# Patient Record
Sex: Male | Born: 1975 | Hispanic: Yes | Marital: Single | Smoking: Former smoker
Health system: Southern US, Community
[De-identification: ages and names within clinical notes are randomized; demographics above are authoritative.]

## PROBLEM LIST (undated history)

## (undated) DIAGNOSIS — J189 Pneumonia, unspecified organism: Secondary | ICD-10-CM

## (undated) DIAGNOSIS — I639 Cerebral infarction, unspecified: Secondary | ICD-10-CM

## (undated) DIAGNOSIS — F419 Anxiety disorder, unspecified: Secondary | ICD-10-CM

## (undated) DIAGNOSIS — J45909 Unspecified asthma, uncomplicated: Secondary | ICD-10-CM

## (undated) DIAGNOSIS — F32A Depression, unspecified: Secondary | ICD-10-CM

---

## 2012-10-12 HISTORY — PX: LEG SURGERY: SHX1003

## 2016-05-03 ENCOUNTER — Emergency Department (HOSPITAL_BASED_OUTPATIENT_CLINIC_OR_DEPARTMENT_OTHER): Payer: No Typology Code available for payment source

## 2016-05-03 ENCOUNTER — Emergency Department (HOSPITAL_BASED_OUTPATIENT_CLINIC_OR_DEPARTMENT_OTHER)
Admission: EM | Admit: 2016-05-03 | Discharge: 2016-05-03 | Disposition: A | Discharge status: Home / Self Care | Payer: No Typology Code available for payment source | Attending: Emergency Medicine | Admitting: Emergency Medicine

## 2016-05-03 ENCOUNTER — Encounter (HOSPITAL_BASED_OUTPATIENT_CLINIC_OR_DEPARTMENT_OTHER): Payer: Self-pay | Admitting: Emergency Medicine

## 2016-05-03 DIAGNOSIS — Y92410 Unspecified street and highway as the place of occurrence of the external cause: Secondary | ICD-10-CM | POA: Diagnosis not present

## 2016-05-03 DIAGNOSIS — F17200 Nicotine dependence, unspecified, uncomplicated: Secondary | ICD-10-CM | POA: Diagnosis not present

## 2016-05-03 DIAGNOSIS — M25512 Pain in left shoulder: Secondary | ICD-10-CM | POA: Diagnosis present

## 2016-05-03 DIAGNOSIS — Y9389 Activity, other specified: Secondary | ICD-10-CM | POA: Insufficient documentation

## 2016-05-03 DIAGNOSIS — Y999 Unspecified external cause status: Secondary | ICD-10-CM | POA: Diagnosis not present

## 2016-05-03 DIAGNOSIS — S43102A Unspecified dislocation of left acromioclavicular joint, initial encounter: Secondary | ICD-10-CM | POA: Insufficient documentation

## 2016-05-03 MED ORDER — KETOROLAC TROMETHAMINE 30 MG/ML IJ SOLN
30.0000 mg | Freq: Once | INTRAMUSCULAR | Status: AC
  Administered 2016-05-03: 30 mg via INTRAMUSCULAR
  Filled 2016-05-03: qty 1

## 2016-05-03 MED ORDER — IBUPROFEN 800 MG PO TABS: 800.0000 mg | ORAL_TABLET | Freq: Three times a day (TID) | ORAL | 0 refills | Status: DC
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
## 2016-05-03 NOTE — ED Notes (Signed)
Pt on phone when MD came to assess, pt refused to get off phone.
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
## 2016-09-14 ENCOUNTER — Encounter (HOSPITAL_COMMUNITY): Payer: Self-pay | Admitting: *Deleted

## 2016-09-14 ENCOUNTER — Emergency Department (HOSPITAL_COMMUNITY)
Admission: EM | Admit: 2016-09-14 | Discharge: 2016-09-14 | Disposition: A | Discharge status: Home / Self Care | Payer: Self-pay | Attending: Emergency Medicine | Admitting: Emergency Medicine

## 2016-09-14 DIAGNOSIS — F17200 Nicotine dependence, unspecified, uncomplicated: Secondary | ICD-10-CM | POA: Insufficient documentation

## 2016-09-14 DIAGNOSIS — Z8673 Personal history of transient ischemic attack (TIA), and cerebral infarction without residual deficits: Secondary | ICD-10-CM | POA: Insufficient documentation

## 2016-09-14 DIAGNOSIS — K0889 Other specified disorders of teeth and supporting structures: Secondary | ICD-10-CM

## 2016-09-14 DIAGNOSIS — K029 Dental caries, unspecified: Secondary | ICD-10-CM | POA: Insufficient documentation

## 2016-09-14 DIAGNOSIS — R220 Localized swelling, mass and lump, head: Secondary | ICD-10-CM

## 2016-09-14 HISTORY — DX: Cerebral infarction, unspecified: I63.9

## 2016-09-14 MED ORDER — CLINDAMYCIN HCL 150 MG PO CAPS: 300.0000 mg | ORAL_CAPSULE | Freq: Three times a day (TID) | ORAL | 0 refills | Status: DC

## 2016-09-14 NOTE — ED Notes (Signed)
Declined W/C at D/C and was escorted to lobby by RN. 

## 2016-09-14 NOTE — ED Provider Notes (Signed)
[facility] Provider Note   CSN: [id number] Arrival date & time: 09/14/16  [id number]  By signing my name below, I, Gregory Bishop, attest that this documentation has been prepared under the direction and in the presence of Dione HousekeeperKenith Ruthia Person, PA. Electronically Signed: Javier Dockerobert Ryan Bishop, ER Scribe. 05/23/2016. 9:06 AM.   History   Chief Complaint Chief Complaint  Patient presents with  . Dental Pain   HPI  HPI Comments: Gregory Bishop is a 40 y.o. male who presents to the Emergency Department complaining two days of upper right sided facial swelling.  He had dental pain at onset, but has no pain at this time. He denies difficulty breathing, swllowing, fever or chills. Denies any erythema or warmth of the area. Denies any purulent discharge or trismus. Denies any pain at this time. He has a past hx of tooth issues. He does not have a dentist in the area. He has been trying holistic treatments for pain and swelling at home including coconut oil and tumeric with sign relief of swelling and pain. Denies any other symptoms at this time.   Past Medical History:  Diagnosis Date  . Stroke [facility](HCC)     There are no active problems to display for this patient.   Past Surgical History:  Procedure Laterality Date  . LEG SURGERY Left 2014    OB History    No data available      Home Medications    Prior to Admission medications   Medication Sig Start Date End Date Taking? Authorizing Provider  ibuprofen (ADVIL,MOTRIN) 800 MG tablet Take 1 tablet (800 mg total) by mouth 3 (three) times daily. 05/03/16   Marily MemosJason Mesner, MD    Family History History reviewed. No pertinent family history.  Social History Social History  Substance Use Topics  . Smoking status: Current Every Day Smoker  . Smokeless tobacco: Never Used  . Alcohol use Yes     Comment: daily     Allergies   Patient has no known allergies.   Review of Systems Review of Systems  Constitutional: Negative for chills  and fever.  HENT: Positive for facial swelling. Negative for mouth sores, sinus pain, sore throat and trouble swallowing.   Respiratory: Negative for shortness of breath.   Gastrointestinal: Negative for nausea and vomiting.  Skin: Negative for color change.  Neurological: Negative for dizziness, light-headedness and headaches.  Psychiatric/Behavioral: Negative for sleep disturbance.     Physical Exam Updated Vital Signs BP 117/75 (BP Location: Right Arm)   Pulse 78   Temp 98.4 F (36.9 C) (Oral)   Resp 15   Ht 6\' 1"  (1.854 m)   Wt 165 lb (74.8 kg)   SpO2 100%   BMI 21.77 kg/m   Physical Exam  Constitutional: He is oriented to person, place, and time. He appears well-developed and well-nourished. No distress.  Patient is speaking complete sentences and maintaining his airway. Managing secretions.  HENT:  Head: Normocephalic and atraumatic.  Mouth/Throat: Oropharynx is clear and moist and mucous membranes are normal. No trismus in the jaw. Abnormal dentition. Dental caries present. No dental abscesses.    Patient does have mild left-sided facial swelling without any erythema or warmth. Patient without any trismus. No sublingual or submandibular swelling. Patient with full range of motion of his neck.  Eyes: Pupils are equal, round, and reactive to light.  Neck: Neck supple.  Cardiovascular: Normal rate.   Pulmonary/Chest: Effort normal. No respiratory distress.  Musculoskeletal: Normal range of motion.  Neurological: He is alert and oriented to person, place, and time. Coordination normal.  Skin: Skin is warm and dry. He is not diaphoretic.  Psychiatric: He has a normal mood and affect. His behavior is normal.  Nursing note and vitals reviewed.    ED Treatments / Results  Labs (all labs ordered are listed, but only abnormal results are displayed) Labs Reviewed - No data to display  EKG  EKG Interpretation None       Radiology No results  found.  Procedures Procedures (including critical care time)  Medications Ordered in ED Medications - No data to display   Initial Impression / Assessment and Plan / ED Course  I have reviewed the triage vital signs and the nursing notes.  Pertinent labs & imaging results that were available during my care of the patient were reviewed by me and considered in my medical decision making (see chart for details).  Clinical Course    Patient with mild facial swelling.  No abscess requiring immediate incision and drainage appreciated.  Exam not concerning for Ludwig's angina or pharyngeal abscess.  Will treat with Clindamycin. Pt instructed to follow-up with dentist.  Discussed return precautions. Pt safe for discharge. He is non toxic appearing. Hemodynamically stable and afebrile.    Final Clinical Impressions(s) / ED Diagnoses   Final diagnoses:  Pain, dental  Facial swelling    New Prescriptions New Prescriptions   CLINDAMYCIN (CLEOCIN) 150 MG CAPSULE    Take 2 capsules (300 mg total) by mouth 3 (three) times daily.     I personally performed the services described in this documentation, which was scribed in my presence. The recorded information has been reviewed and is accurate.       Rise MuKenneth T Katarzyna Wolven, PA-C 09/14/16 [id number]    Loren Raceravid Yelverton, MD 09/19/16 [phone]

## 2016-09-14 NOTE — Discharge Instructions (Signed)
Please take the antibiotics 3 times a day for 7 days. I have given you a resource guide for dentist in the area. He may take ibuprofen and Tylenol for pain. Continue to ice the affected area to help with the swelling. Return to the ED if your symptoms worsen or if the swelling worsens, you become short of breath, are unable to open your mouth, or difficulty swallowing.

## 2016-09-14 NOTE — ED Triage Notes (Signed)
PT reports the tooth pain started on SAt. Facial swelling present on assessment.

## 2016-10-16 ENCOUNTER — Encounter (HOSPITAL_COMMUNITY): Payer: Self-pay | Admitting: *Deleted

## 2016-10-16 ENCOUNTER — Emergency Department (HOSPITAL_COMMUNITY)
Admission: EM | Admit: 2016-10-16 | Discharge: 2016-10-16 | Disposition: A | Discharge status: Home / Self Care | Payer: Self-pay | Attending: Emergency Medicine | Admitting: Emergency Medicine

## 2016-10-16 DIAGNOSIS — Z8673 Personal history of transient ischemic attack (TIA), and cerebral infarction without residual deficits: Secondary | ICD-10-CM | POA: Insufficient documentation

## 2016-10-16 DIAGNOSIS — K0889 Other specified disorders of teeth and supporting structures: Secondary | ICD-10-CM | POA: Insufficient documentation

## 2016-10-16 DIAGNOSIS — F17200 Nicotine dependence, unspecified, uncomplicated: Secondary | ICD-10-CM | POA: Insufficient documentation

## 2016-10-16 MED ORDER — ACETAMINOPHEN 325 MG PO TABS
325.0000 mg | ORAL_TABLET | Freq: Once | ORAL | Status: AC
  Administered 2016-10-16: 325 mg via ORAL
  Filled 2016-10-16: qty 1

## 2016-10-16 MED ORDER — NAPROXEN 250 MG PO TABS: 250.0000 mg | ORAL_TABLET | Freq: Two times a day (BID) | ORAL | 0 refills | Status: DC

## 2016-10-16 MED ORDER — PENICILLIN V POTASSIUM 500 MG PO TABS: 500.0000 mg | ORAL_TABLET | Freq: Four times a day (QID) | ORAL | 0 refills | Status: DC

## 2016-10-16 NOTE — ED Triage Notes (Signed)
To ED for eval left lower jaw pain/swelling. Pt states he has recently been on abx for same but unable to find an affordable dentist.

## 2016-10-16 NOTE — ED Provider Notes (Signed)
[facility] Provider Note   CSN: [id number] Arrival date & time: 10/16/16  1403   By signing my name below, I, Cynda AcresHailei Fulton, attest that this documentation has been prepared under the direction and in the presence of Everlene FarrierWilliam Railee Bonillas, PA-C Electronically Signed: Cynda AcresHailei Fulton, Scribe. 10/16/16. 3:13 PM.   History   Chief Complaint Chief Complaint  Patient presents with  . Facial Swelling  . Dental Pain   Generally poor dentition.  HPI Comments: Gregory Bishop is a 41 y.o. male who presents to the Emergency Department complaining of dental pain that began one month ago. Patient states he has a loose tooth, he woke up yesterday morning with left facial swelling. He does not have a primary dental physician. No modifying factors indicated. He denies any fevers, mouth drainage, sore throat, neck pain, or trouble swallowing.    The history is provided by the patient. No language interpreter was used.    Past Medical History:  Diagnosis Date  . Stroke [facility](HCC)     There are no active problems to display for this patient.   Past Surgical History:  Procedure Laterality Date  . LEG SURGERY Left 2014       Home Medications    Prior to Admission medications   Medication Sig Start Date End Date Taking? Authorizing Provider  naproxen (NAPROSYN) 250 MG tablet Take 1 tablet (250 mg total) by mouth 2 (two) times daily with a meal. 10/16/16   Everlene FarrierWilliam Mackie Goon, PA-C  penicillin v potassium (VEETID) 500 MG tablet Take 1 tablet (500 mg total) by mouth 4 (four) times daily. 10/16/16   Everlene FarrierWilliam Kaiyana Bedore, PA-C    Family History No family history on file.  Social History Social History  Substance Use Topics  . Smoking status: Current Every Day Smoker  . Smokeless tobacco: Never Used  . Alcohol use Yes     Comment: daily     Allergies   Patient has no known allergies.   Review of Systems Review of Systems  Constitutional: Negative for chills and fever.  HENT: Positive for dental  problem and facial swelling. Negative for drooling, ear pain, sore throat and trouble swallowing.   Eyes: Negative for visual disturbance.  Respiratory: Negative for cough.   Gastrointestinal: Negative for abdominal pain, nausea and vomiting.  Skin: Negative for rash and wound.     Physical Exam Updated Vital Signs BP 120/75 (BP Location: Right Arm)   Pulse (!) 57   Temp 98 F (36.7 C) (Oral)   Resp 18   SpO2 100%   Physical Exam  Constitutional: He appears well-developed and well-nourished. No distress.  Non-toxic appearing.   HENT:  Head: Normocephalic and atraumatic.  Right Ear: External ear normal.  Left Ear: External ear normal.  Mouth/Throat: Oropharynx is clear and moist. No oropharyngeal exudate.  Tenderness to the left lower molar.  Generally poor dentition.  No discharge from the mouth. Mild facial edema to the left lower jaw. Induration noted on exam. No fluctuance or focal abscess. No drainage.  Uvula is midline without edema. Soft palate rises symmetrically. No tonsillar hypertrophy or exudates. Tongue protrusion is normal. No trismus. No PTA.   Eyes: Conjunctivae are normal. Pupils are equal, round, and reactive to light. Right eye exhibits no discharge. Left eye exhibits no discharge.  Neck: Normal range of motion. Neck supple. No JVD present. No tracheal deviation present.  Cardiovascular: Normal rate, regular rhythm and intact distal pulses.   Pulmonary/Chest: Effort normal. No stridor. No respiratory distress.  Lymphadenopathy:    He has no cervical adenopathy.  Neurological: Coordination normal.  Skin: Skin is warm and dry. Capillary refill takes less than 2 seconds. No rash noted. He is not diaphoretic. No erythema. No pallor.  Psychiatric: He has a normal mood and affect. His behavior is normal.  Nursing note and vitals reviewed.    ED Treatments / Results  DIAGNOSTIC STUDIES: Oxygen Saturation is 100% on RA, normal by my interpretation.     COORDINATION OF CARE: 2:48 PM Discussed treatment plan with pt at bedside and pt agreed to plan.  Labs (all labs ordered are listed, but only abnormal results are displayed) Labs Reviewed - No data to display  EKG  EKG Interpretation None       Radiology No results found.  Procedures Procedures (including critical care time)  Medications Ordered in ED Medications - No data to display   Initial Impression / Assessment and Plan / ED Course  I have reviewed the triage vital signs and the nursing notes.  Pertinent labs & imaging results that were available during my care of the patient were reviewed by me and considered in my medical decision making (see chart for details).  Clinical Course    Patient presented with 1 month of left lower dental pain with some associated facial swelling starting yesterday. Patient with toothache.  No gross abscess on exam. There is some mild facial edema with some induration on exam, but no focal abscess..  Exam unconcerning for Ludwig's angina or spread of infection.  Will treat with penicillin and pain medicine.  Urged patient to follow-up with dentist Dr. Lucky Cowboy. I advised the patient to follow-up with their primary care provider this week. I advised the patient to return to the emergency department with new or worsening symptoms or new concerns. The patient verbalized understanding and agreement with plan.      Final Clinical Impressions(s) / ED Diagnoses   Final diagnoses:  Pain, dental    New Prescriptions New Prescriptions   NAPROXEN (NAPROSYN) 250 MG TABLET    Take 1 tablet (250 mg total) by mouth 2 (two) times daily with a meal.   PENICILLIN V POTASSIUM (VEETID) 500 MG TABLET    Take 1 tablet (500 mg total) by mouth 4 (four) times daily.   I personally performed the services described in this documentation, which was scribed in my presence. The recorded information has been reviewed and is accurate.      Everlene Farrier,  PA-C 10/16/16 1514    Arby Barrette, MD 10/16/16 [phone]
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
## 2019-04-03 ENCOUNTER — Emergency Department (HOSPITAL_COMMUNITY)
Admission: EM | Admit: 2019-04-03 | Discharge: 2019-04-03 | Disposition: A | Discharge status: Home / Self Care | Payer: BC Managed Care – PPO | Attending: Emergency Medicine | Admitting: Emergency Medicine

## 2019-04-03 ENCOUNTER — Other Ambulatory Visit: Payer: Self-pay

## 2019-04-03 DIAGNOSIS — F17200 Nicotine dependence, unspecified, uncomplicated: Secondary | ICD-10-CM | POA: Insufficient documentation

## 2019-04-03 DIAGNOSIS — R55 Syncope and collapse: Secondary | ICD-10-CM | POA: Insufficient documentation

## 2019-04-03 DIAGNOSIS — R51 Headache: Secondary | ICD-10-CM | POA: Diagnosis present

## 2019-04-03 DIAGNOSIS — Y9389 Activity, other specified: Secondary | ICD-10-CM | POA: Insufficient documentation

## 2019-04-03 DIAGNOSIS — Y92521 Bus station as the place of occurrence of the external cause: Secondary | ICD-10-CM | POA: Diagnosis not present

## 2019-04-03 DIAGNOSIS — Z0279 Encounter for issue of other medical certificate: Secondary | ICD-10-CM | POA: Diagnosis not present

## 2019-04-03 DIAGNOSIS — Z79899 Other long term (current) drug therapy: Secondary | ICD-10-CM | POA: Insufficient documentation

## 2019-04-03 DIAGNOSIS — Y999 Unspecified external cause status: Secondary | ICD-10-CM | POA: Insufficient documentation

## 2019-04-03 NOTE — Discharge Instructions (Signed)
Return to ER for any concerning symptoms.
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
## 2019-04-03 NOTE — ED Notes (Signed)
Patient verbalizes understanding of discharge instructions. Opportunity for questioning and answers were provided. Armband removed by staff, pt discharged from ED home via POV.  
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
## 2019-04-06 ENCOUNTER — Encounter (HOSPITAL_COMMUNITY): Payer: Self-pay

## 2019-04-06 ENCOUNTER — Other Ambulatory Visit: Payer: Self-pay

## 2019-04-06 ENCOUNTER — Emergency Department (HOSPITAL_COMMUNITY)
Admission: EM | Admit: 2019-04-06 | Discharge: 2019-04-06 | Disposition: A | Discharge status: Home / Self Care | Payer: BC Managed Care – PPO | Attending: Emergency Medicine | Admitting: Emergency Medicine

## 2019-04-06 DIAGNOSIS — F1010 Alcohol abuse, uncomplicated: Secondary | ICD-10-CM

## 2019-04-06 DIAGNOSIS — Z79899 Other long term (current) drug therapy: Secondary | ICD-10-CM | POA: Insufficient documentation

## 2019-04-06 DIAGNOSIS — F17210 Nicotine dependence, cigarettes, uncomplicated: Secondary | ICD-10-CM | POA: Insufficient documentation

## 2019-04-06 DIAGNOSIS — F10120 Alcohol abuse with intoxication, uncomplicated: Secondary | ICD-10-CM | POA: Diagnosis not present

## 2019-04-06 DIAGNOSIS — F10920 Alcohol use, unspecified with intoxication, uncomplicated: Secondary | ICD-10-CM

## 2019-04-06 DIAGNOSIS — Z0489 Encounter for examination and observation for other specified reasons: Secondary | ICD-10-CM | POA: Diagnosis present

## 2019-04-06 LAB — CBC
HCT: 43.7 % (ref 39.0–52.0)
Hemoglobin: 14.7 g/dL (ref 13.0–17.0)
MCH: 31.7 pg (ref 26.0–34.0)
MCHC: 33.6 g/dL (ref 30.0–36.0)
MCV: 94.4 fL (ref 80.0–100.0)
Platelets: 263 10*3/uL (ref 150–400)
RBC: 4.63 MIL/uL (ref 4.22–5.81)
RDW: 12.7 % (ref 11.5–15.5)
WBC: 12.2 10*3/uL — ABNORMAL HIGH (ref 4.0–10.5)
nRBC: 0 % (ref 0.0–0.2)

## 2019-04-06 LAB — COMPREHENSIVE METABOLIC PANEL
ALT: 27 U/L (ref 0–44)
AST: 44 U/L — ABNORMAL HIGH (ref 15–41)
Albumin: 4.3 g/dL (ref 3.5–5.0)
Alkaline Phosphatase: 95 U/L (ref 38–126)
Anion gap: 14 (ref 5–15)
BUN: 7 mg/dL (ref 6–20)
CO2: 23 mmol/L (ref 22–32)
Calcium: 8.9 mg/dL (ref 8.9–10.3)
Chloride: 99 mmol/L (ref 98–111)
Creatinine, Ser: 0.78 mg/dL (ref 0.61–1.24)
GFR calc Af Amer: >60 mL/min (ref >60)
GFR calc non Af Amer: >60 mL/min (ref >60)
Glucose, Bld: 124 mg/dL — ABNORMAL HIGH (ref 70–99)
Potassium: 3.5 mmol/L (ref 3.5–5.1)
Sodium: 136 mmol/L (ref 135–145)
Total Bilirubin: 0.3 mg/dL (ref 0.3–1.2)
Total Protein: 7.9 g/dL (ref 6.5–8.1)

## 2019-04-06 LAB — RAPID URINE DRUG SCREEN, HOSP PERFORMED
Amphetamines: NOT DETECTED
Barbiturates: NOT DETECTED
Benzodiazepines: NOT DETECTED
Cocaine: NOT DETECTED
Opiates: NOT DETECTED
Tetrahydrocannabinol: NOT DETECTED

## 2019-04-06 LAB — ETHANOL: Alcohol, Ethyl (B): 255 mg/dL — ABNORMAL HIGH (ref <10)

## 2019-04-06 NOTE — ED Triage Notes (Signed)
Pt states GF sent him here for ETOH issues. Pt states 4 beers today. Pt states he drinks 15 beers daily. No SI/HI
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
## 2019-04-06 NOTE — ED Notes (Signed)
Bed: WLPT4 Expected date:  Expected time:  Means of arrival:  Comments: 

## 2019-04-06 NOTE — Discharge Instructions (Signed)
It is important to stop drinking all forms of alcohol.  Attached to this document is a list of places where you can go to receive treatment for alcohol abuse and urged to drink.  Use this guide to help you find a place to go for help.  Return here, if needed, for problems.

## 2019-04-08 ENCOUNTER — Other Ambulatory Visit: Payer: Self-pay

## 2019-04-08 ENCOUNTER — Encounter (HOSPITAL_COMMUNITY): Payer: Self-pay

## 2019-04-08 ENCOUNTER — Emergency Department (HOSPITAL_COMMUNITY)
Admission: EM | Admit: 2019-04-08 | Discharge: 2019-04-08 | Disposition: A | Discharge status: Home / Self Care | Payer: BC Managed Care – PPO | Attending: Emergency Medicine | Admitting: Emergency Medicine

## 2019-04-08 DIAGNOSIS — F10230 Alcohol dependence with withdrawal, uncomplicated: Secondary | ICD-10-CM | POA: Diagnosis not present

## 2019-04-08 DIAGNOSIS — Z8673 Personal history of transient ischemic attack (TIA), and cerebral infarction without residual deficits: Secondary | ICD-10-CM | POA: Diagnosis not present

## 2019-04-08 DIAGNOSIS — R251 Tremor, unspecified: Secondary | ICD-10-CM | POA: Diagnosis present

## 2019-04-08 DIAGNOSIS — F17200 Nicotine dependence, unspecified, uncomplicated: Secondary | ICD-10-CM | POA: Insufficient documentation

## 2019-04-08 DIAGNOSIS — F10930 Alcohol use, unspecified with withdrawal, uncomplicated: Secondary | ICD-10-CM

## 2019-04-08 LAB — COMPREHENSIVE METABOLIC PANEL
ALT: 23 U/L (ref 0–44)
AST: 38 U/L (ref 15–41)
Albumin: 3.4 g/dL — ABNORMAL LOW (ref 3.5–5.0)
Alkaline Phosphatase: 79 U/L (ref 38–126)
Anion gap: 9 (ref 5–15)
BUN: 7 mg/dL (ref 6–20)
CO2: 28 mmol/L (ref 22–32)
Calcium: 8.6 mg/dL — ABNORMAL LOW (ref 8.9–10.3)
Chloride: 99 mmol/L (ref 98–111)
Creatinine, Ser: 0.95 mg/dL (ref 0.61–1.24)
GFR calc Af Amer: >60 mL/min (ref >60)
GFR calc non Af Amer: >60 mL/min (ref >60)
Glucose, Bld: 99 mg/dL (ref 70–99)
Potassium: 4.5 mmol/L (ref 3.5–5.1)
Sodium: 136 mmol/L (ref 135–145)
Total Bilirubin: 1.2 mg/dL (ref 0.3–1.2)
Total Protein: 6.4 g/dL — ABNORMAL LOW (ref 6.5–8.1)

## 2019-04-08 LAB — CBC
HCT: 43 % (ref 39.0–52.0)
Hemoglobin: 14.7 g/dL (ref 13.0–17.0)
MCH: 31.5 pg (ref 26.0–34.0)
MCHC: 34.2 g/dL (ref 30.0–36.0)
MCV: 92.1 fL (ref 80.0–100.0)
Platelets: UNDETERMINED 10*3/uL (ref 150–400)
RBC: 4.67 MIL/uL (ref 4.22–5.81)
RDW: 12.4 % (ref 11.5–15.5)
WBC: 9.7 10*3/uL (ref 4.0–10.5)
nRBC: 0 % (ref 0.0–0.2)

## 2019-04-08 LAB — ETHANOL: Alcohol, Ethyl (B): <10 mg/dL (ref <10)

## 2019-04-08 MED ORDER — LORAZEPAM 1 MG PO TABS
1.0000 mg | ORAL_TABLET | Freq: Once | ORAL | Status: AC
  Administered 2019-04-08: 1 mg via ORAL
  Filled 2019-04-08: qty 1

## 2019-04-08 MED ORDER — ONDANSETRON HCL 4 MG PO TABS: 4.0000 mg | ORAL_TABLET | Freq: Three times a day (TID) | ORAL | 0 refills | Status: DC | PRN

## 2019-04-08 MED ORDER — CHLORDIAZEPOXIDE HCL 25 MG PO CAPS: ORAL_CAPSULE | ORAL | 0 refills | Status: DC

## 2019-04-08 MED ORDER — ONDANSETRON HCL 4 MG/2ML IJ SOLN
4.0000 mg | Freq: Once | INTRAMUSCULAR | Status: AC
  Administered 2019-04-08: 4 mg via INTRAVENOUS
  Filled 2019-04-08: qty 2

## 2019-04-08 MED ORDER — SODIUM CHLORIDE 0.9 % IV BOLUS
1000.0000 mL | Freq: Once | INTRAVENOUS | Status: AC
  Administered 2019-04-08: 1000 mL via INTRAVENOUS

## 2019-04-08 NOTE — Discharge Instructions (Signed)
It is important that you continue to not drink alcohol. Use Zofran as needed for nausea, dry heaving, or vomiting. Take librium as prescribed for shakes and tremors.  It is important that you stay well hydrated with water.  [facility]. Return to the ER if you develop worsening shakes despite medication, persistent vomiting.

## 2019-04-08 NOTE — ED Notes (Signed)
Please call the pt's wife Pearletha Forge at [phone] to discuss pt's plan of care as she feels it's very important that she speak to his Dr or nurse. Wife has tried to [facility] patient but he was released from [place].

## 2019-04-08 NOTE — ED Notes (Signed)
Pt given PO meds by Tiffany(RN) pt drank water with PO meds, tolerated well.

## 2019-04-08 NOTE — ED Notes (Signed)
Patient verbalizes understanding of discharge instructions. Opportunity for questioning and answers were provided. Armband removed by staff, pt discharged from ED.  

## 2019-04-08 NOTE — ED Triage Notes (Signed)
Pt reports withdrawals from alcohol with tremors that started this morning. Pt states he drinks about 10 beers a day, last beer was last night. Mild tremors noted, dry heaving. Pt a.o
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
## 2019-10-02 ENCOUNTER — Encounter (HOSPITAL_COMMUNITY): Payer: Self-pay | Admitting: Emergency Medicine

## 2019-10-02 ENCOUNTER — Other Ambulatory Visit: Payer: Self-pay

## 2019-10-02 ENCOUNTER — Emergency Department (HOSPITAL_COMMUNITY): Payer: Self-pay

## 2019-10-02 ENCOUNTER — Emergency Department (HOSPITAL_COMMUNITY)
Admission: EM | Admit: 2019-10-02 | Discharge: 2019-10-03 | Disposition: A | Discharge status: Home / Self Care | Payer: Self-pay | Attending: Emergency Medicine | Admitting: Emergency Medicine

## 2019-10-02 DIAGNOSIS — R519 Headache, unspecified: Secondary | ICD-10-CM | POA: Insufficient documentation

## 2019-10-02 DIAGNOSIS — Z20828 Contact with and (suspected) exposure to other viral communicable diseases: Secondary | ICD-10-CM | POA: Insufficient documentation

## 2019-10-02 DIAGNOSIS — R1013 Epigastric pain: Secondary | ICD-10-CM | POA: Insufficient documentation

## 2019-10-02 DIAGNOSIS — J069 Acute upper respiratory infection, unspecified: Secondary | ICD-10-CM | POA: Insufficient documentation

## 2019-10-02 DIAGNOSIS — Z79899 Other long term (current) drug therapy: Secondary | ICD-10-CM | POA: Insufficient documentation

## 2019-10-02 DIAGNOSIS — Z8673 Personal history of transient ischemic attack (TIA), and cerebral infarction without residual deficits: Secondary | ICD-10-CM | POA: Insufficient documentation

## 2019-10-02 DIAGNOSIS — F17210 Nicotine dependence, cigarettes, uncomplicated: Secondary | ICD-10-CM | POA: Insufficient documentation

## 2019-10-02 LAB — COMPREHENSIVE METABOLIC PANEL
ALT: 21 U/L (ref 0–44)
AST: 23 U/L (ref 15–41)
Albumin: 4.3 g/dL (ref 3.5–5.0)
Alkaline Phosphatase: 73 U/L (ref 38–126)
Anion gap: 13 (ref 5–15)
BUN: 9 mg/dL (ref 6–20)
CO2: 25 mmol/L (ref 22–32)
Calcium: 8.5 mg/dL — ABNORMAL LOW (ref 8.9–10.3)
Chloride: 101 mmol/L (ref 98–111)
Creatinine, Ser: 0.93 mg/dL (ref 0.61–1.24)
GFR calc Af Amer: >60 mL/min (ref >60)
GFR calc non Af Amer: >60 mL/min (ref >60)
Glucose, Bld: 106 mg/dL — ABNORMAL HIGH (ref 70–99)
Potassium: 3.7 mmol/L (ref 3.5–5.1)
Sodium: 139 mmol/L (ref 135–145)
Total Bilirubin: 0.6 mg/dL (ref 0.3–1.2)
Total Protein: 7.3 g/dL (ref 6.5–8.1)

## 2019-10-02 LAB — CBC
HCT: 40.4 % (ref 39.0–52.0)
Hemoglobin: 13.8 g/dL (ref 13.0–17.0)
MCH: 30.5 pg (ref 26.0–34.0)
MCHC: 34.2 g/dL (ref 30.0–36.0)
MCV: 89.2 fL (ref 80.0–100.0)
Platelets: 179 10*3/uL (ref 150–400)
RBC: 4.53 MIL/uL (ref 4.22–5.81)
RDW: 12.4 % (ref 11.5–15.5)
WBC: 19.9 10*3/uL — ABNORMAL HIGH (ref 4.0–10.5)
nRBC: 0 % (ref 0.0–0.2)

## 2019-10-02 LAB — URINALYSIS, ROUTINE W REFLEX MICROSCOPIC
Bilirubin Urine: NEGATIVE
Glucose, UA: NEGATIVE mg/dL
Hgb urine dipstick: NEGATIVE
Ketones, ur: NEGATIVE mg/dL
Leukocytes,Ua: NEGATIVE
Nitrite: NEGATIVE
Protein, ur: NEGATIVE mg/dL
Specific Gravity, Urine: 1.012 (ref 1.005–1.030)
pH: 5 (ref 5.0–8.0)

## 2019-10-02 LAB — LIPASE, BLOOD: Lipase: 21 U/L (ref 11–51)

## 2019-10-02 MED ORDER — SODIUM CHLORIDE 0.9% FLUSH: 3.0000 mL | Freq: Once | INTRAVENOUS | Status: DC

## 2019-10-02 NOTE — ED Triage Notes (Signed)
Patient reports multiple complaints: SOB with dry cough , fatigue, headache , low back pain and generalized abdominal pain onset today , he is requesting COVID testing .

## 2019-10-03 ENCOUNTER — Telehealth: Payer: Self-pay | Admitting: *Deleted

## 2019-10-03 LAB — SARS CORONAVIRUS 2 (TAT 6-24 HRS): SARS Coronavirus 2: NEGATIVE

## 2019-10-03 MED ORDER — IPRATROPIUM BROMIDE HFA 17 MCG/ACT IN AERS
2.0000 | INHALATION_SPRAY | Freq: Once | RESPIRATORY_TRACT | Status: AC
  Administered 2019-10-03: 2 via RESPIRATORY_TRACT
  Filled 2019-10-03: qty 12.9

## 2019-10-03 MED ORDER — ALBUTEROL SULFATE HFA 108 (90 BASE) MCG/ACT IN AERS
2.0000 | INHALATION_SPRAY | Freq: Once | RESPIRATORY_TRACT | Status: AC
  Administered 2019-10-03: 2 via RESPIRATORY_TRACT
  Filled 2019-10-03: qty 6.7

## 2019-10-03 MED ORDER — BENZONATATE 100 MG PO CAPS: 100.0000 mg | ORAL_CAPSULE | Freq: Three times a day (TID) | ORAL | 0 refills | Status: DC

## 2019-10-03 NOTE — Discharge Instructions (Signed)
Use the inhaler 2 puffs every 4 hours if this helps with cough or shortness of breath. Take Tessalon for cough as directed.   You have been given a note for work for the next 4 days. If your COVID test is positive you will need to be out for 10 days - going back ONLY if your symptoms have resolved AND you have not had a fever for the last 3 days. If you remain sick longer than this time frame, return to the emergency department or go to your doctor for further care.

## 2019-10-03 NOTE — ED Provider Notes (Signed)
[facility]   CSN: [id number] Arrival date & time: 10/02/19  2151     History Chief Complaint  Patient presents with  . Shortness of Breath  . Abdominal Pain  . Headache    Gregory Bishop is a 43 y.o. male.  Patient to ED with symptoms URI including nasal congestion, fatigue x several days. Today he became SOB, developed a productive cough, body aches and has chest pain when coughing. He reports nausea without vomiting. No diarrhea. He is a smoker. No history of asthma, or COPD. He states his COVID exposure is unknown as people don't always wear a mask while at work. No fever at any point over the last week.   The history is provided by the patient. No language interpreter was used.  Shortness of Breath Associated symptoms: abdominal pain (epigastric pain with cough), chest pain (with cough), cough and headaches   Associated symptoms: no fever and no vomiting   Abdominal Pain Associated symptoms: chest pain (with cough), cough, nausea and shortness of breath   Associated symptoms: no chills, no fever and no vomiting   Headache Associated symptoms: abdominal pain (epigastric pain with cough), congestion, cough, myalgias and nausea   Associated symptoms: no fever and no vomiting        Past Medical History:  Diagnosis Date  . Stroke [facility](HCC)     There are no problems to display for this patient.   Past Surgical History:  Procedure Laterality Date  . LEG SURGERY Left 2014       No family history on file.  Social History   Tobacco Use  . Smoking status: Current Every Day Smoker    Packs/day: 0.50  . Smokeless tobacco: Never Used  Substance Use Topics  . Alcohol use: Yes    Comment: daily  . Drug use: No    Home Medications Prior to Admission medications   Medication Sig Start Date End Date Taking? Authorizing Provider  chlordiazePOXIDE (LIBRIUM) 25 MG capsule 50mg  PO TID x 1D, then 25-50mg  PO BID X 1D,  then 25-50mg  PO QD X 1D 04/08/19   Caccavale, Sophia, PA-C  naproxen (NAPROSYN) 250 MG tablet Take 1 tablet (250 mg total) by mouth 2 (two) times daily with a meal. 10/16/16   Everlene Farrieransie, William, PA-C  ondansetron (ZOFRAN) 4 MG tablet Take 1 tablet (4 mg total) by mouth every 8 (eight) hours as needed for nausea or vomiting. 04/08/19   Caccavale, Sophia, PA-C  penicillin v potassium (VEETID) 500 MG tablet Take 1 tablet (500 mg total) by mouth 4 (four) times daily. 10/16/16   Everlene Farrieransie, William, PA-C    Allergies    Patient has no known allergies.  Review of Systems   Review of Systems  Constitutional: Negative for chills and fever.  HENT: Positive for congestion.   Respiratory: Positive for cough and shortness of breath.   Cardiovascular: Positive for chest pain (with cough).  Gastrointestinal: Positive for abdominal pain (epigastric pain with cough) and nausea. Negative for vomiting.  Musculoskeletal: Positive for myalgias.  Skin: Negative.   Neurological: Positive for headaches.    Physical Exam Updated Vital Signs BP 116/77 (BP Location: Left Arm)   Pulse 98   Temp 98.2 F (36.8 C) (Oral)   Resp 19   SpO2 93%   Physical Exam Vitals and nursing note reviewed.  Constitutional:      Appearance: He is well-developed.  HENT:     Head: Normocephalic.  Cardiovascular:  Rate and Rhythm: Normal rate and regular rhythm.     Heart sounds: No murmur.  Pulmonary:     Effort: Pulmonary effort is normal.     Breath sounds: Normal breath sounds. No wheezing, rhonchi or rales.  Chest:     Chest wall: Tenderness (sternal chest tenderness. ) present.  Abdominal:     General: Bowel sounds are normal.     Palpations: Abdomen is soft.     Tenderness: There is no abdominal tenderness. There is no guarding or rebound.  Musculoskeletal:        General: Normal range of motion.     Cervical back: Normal range of motion and neck supple.  Skin:    General: Skin is warm and dry.     Findings: No  erythema or rash.  Neurological:     Mental Status: He is alert and oriented to person, place, and time.     ED Results / Procedures / Treatments   Labs (all labs ordered are listed, but only abnormal results are displayed) Labs Reviewed  COMPREHENSIVE METABOLIC PANEL - Abnormal; Notable for the following components:      Result Value   Glucose, Bld 106 (*)    Calcium 8.5 (*)    All other components within normal limits  CBC - Abnormal; Notable for the following components:   WBC 19.9 (*)    All other components within normal limits  SARS CORONAVIRUS 2 (TAT 6-24 HRS)  LIPASE, BLOOD  URINALYSIS, ROUTINE W REFLEX MICROSCOPIC    EKG EKG Interpretation  Date/Time:  Monday October 02 2019 22:02:00 EST Ventricular Rate:  110 PR Interval:  146 QRS Duration: 74 QT Interval:  304 QTC Calculation: 411 R Axis:   92 Text Interpretation: Sinus tachycardia Possible Left atrial enlargement Rightward axis Borderline ECG No old tracing to compare Confirmed by Dione Booze ([id number]) on 10/02/2019 11:00:50 PM   Radiology DG Chest Portable 1 View  Result Date: 10/02/2019 CLINICAL DATA:  43 year old male with shortness of breath and cough. EXAM: PORTABLE CHEST 1 VIEW COMPARISON:  Chest radiograph dated 05/03/2016. FINDINGS: There is no focal consolidation, pleural effusion, pneumothorax. The left costophrenic angle has been excluded from the image. The cardiac silhouette is within normal limits. No acute osseous pathology. IMPRESSION: No active disease. Electronically Signed   By: Elgie Collard M.D.   On: 10/02/2019 23:22    Procedures Procedures (including critical care time)  Medications Ordered in ED Medications  sodium chloride flush (NS) 0.9 % injection 3 mL (3 mLs Intravenous Not Given 10/03/19 0239)  albuterol (VENTOLIN HFA) 108 (90 Base) MCG/ACT inhaler 2 puff (has no administration in time range)  ipratropium (ATROVENT HFA) inhaler 2 puff (has no administration in time range)      ED Course  I have reviewed the triage vital signs and the nursing notes.  Pertinent labs & imaging results that were available during my care of the patient were reviewed by me and considered in my medical decision making (see chart for details).    MDM Rules/Calculators/A&P                      Patient to ED with URI sxs x 3 days, worsened by SOB and painful cough today. No fever. Concerned for COVID infection.   He is overall well appearing. No hypoxia, tachycardia or fever. CXR is clear of infiltrates or opacities. He has a leukocytosis of 19.9 without evidence to suggest bacterial infection. COVID collected and is  pending.   He has been given an Albuterol inhaler, Tessalon for cough relief. Recommended Tylenol only, PCP follow up prn.  Final Clinical Impression(s) / ED Diagnoses Final diagnoses:  None   1. URI 2. Possible COVID exposure  Rx / DC Orders ED Discharge Orders    None       Charlann Lange, PA-C 10/03/19 5631    Merrily Pew, MD 10/03/19 [phone]

## 2019-10-03 NOTE — Telephone Encounter (Signed)
Patient called ,given negative covid result.

## 2020-01-13 ENCOUNTER — Other Ambulatory Visit: Payer: Self-pay

## 2020-01-13 ENCOUNTER — Encounter (HOSPITAL_COMMUNITY): Payer: Self-pay | Admitting: Emergency Medicine

## 2020-01-13 ENCOUNTER — Emergency Department (HOSPITAL_COMMUNITY)
Admission: EM | Admit: 2020-01-13 | Discharge: 2020-01-14 | Disposition: A | Discharge status: Home / Self Care | Payer: Self-pay | Attending: Emergency Medicine | Admitting: Emergency Medicine

## 2020-01-13 ENCOUNTER — Emergency Department (HOSPITAL_COMMUNITY): Payer: Self-pay

## 2020-01-13 DIAGNOSIS — R0602 Shortness of breath: Secondary | ICD-10-CM

## 2020-01-13 DIAGNOSIS — Z20822 Contact with and (suspected) exposure to covid-19: Secondary | ICD-10-CM | POA: Diagnosis not present

## 2020-01-13 DIAGNOSIS — F17210 Nicotine dependence, cigarettes, uncomplicated: Secondary | ICD-10-CM | POA: Insufficient documentation

## 2020-01-13 DIAGNOSIS — J4521 Mild intermittent asthma with (acute) exacerbation: Secondary | ICD-10-CM | POA: Insufficient documentation

## 2020-01-13 DIAGNOSIS — Z79899 Other long term (current) drug therapy: Secondary | ICD-10-CM | POA: Diagnosis not present

## 2020-01-13 LAB — COMPREHENSIVE METABOLIC PANEL
ALT: 29 U/L (ref 0–44)
AST: 26 U/L (ref 15–41)
Albumin: 3.7 g/dL (ref 3.5–5.0)
Alkaline Phosphatase: 75 U/L (ref 38–126)
Anion gap: 9 (ref 5–15)
BUN: 12 mg/dL (ref 6–20)
CO2: 27 mmol/L (ref 22–32)
Calcium: 9 mg/dL (ref 8.9–10.3)
Chloride: 104 mmol/L (ref 98–111)
Creatinine, Ser: 1.06 mg/dL (ref 0.61–1.24)
GFR calc Af Amer: >60 mL/min (ref >60)
GFR calc non Af Amer: >60 mL/min (ref >60)
Glucose, Bld: 142 mg/dL — ABNORMAL HIGH (ref 70–99)
Potassium: 3.7 mmol/L (ref 3.5–5.1)
Sodium: 140 mmol/L (ref 135–145)
Total Bilirubin: 0.5 mg/dL (ref 0.3–1.2)
Total Protein: 6.8 g/dL (ref 6.5–8.1)

## 2020-01-13 LAB — TROPONIN I (HIGH SENSITIVITY)
Troponin I (High Sensitivity): 4 ng/L (ref <18)
Troponin I (High Sensitivity): 3 ng/L (ref <18)

## 2020-01-13 LAB — CBC WITH DIFFERENTIAL/PLATELET
Abs Immature Granulocytes: 0.06 10*3/uL (ref 0.00–0.07)
Basophils Absolute: 0.1 10*3/uL (ref 0.0–0.1)
Basophils Relative: 0 %
Eosinophils Absolute: 0.4 10*3/uL (ref 0.0–0.5)
Eosinophils Relative: 2 %
HCT: 43.7 % (ref 39.0–52.0)
Hemoglobin: 14.1 g/dL (ref 13.0–17.0)
Immature Granulocytes: 0 %
Lymphocytes Relative: 19 %
Lymphs Abs: 3.1 10*3/uL (ref 0.7–4.0)
MCH: 30 pg (ref 26.0–34.0)
MCHC: 32.3 g/dL (ref 30.0–36.0)
MCV: 93 fL (ref 80.0–100.0)
Monocytes Absolute: 1.2 10*3/uL — ABNORMAL HIGH (ref 0.1–1.0)
Monocytes Relative: 7 %
Neutro Abs: 11.5 10*3/uL — ABNORMAL HIGH (ref 1.7–7.7)
Neutrophils Relative %: 72 %
Platelets: 258 10*3/uL (ref 150–400)
RBC: 4.7 MIL/uL (ref 4.22–5.81)
RDW: 12.5 % (ref 11.5–15.5)
WBC: 16.2 10*3/uL — ABNORMAL HIGH (ref 4.0–10.5)
nRBC: 0 % (ref 0.0–0.2)

## 2020-01-13 MED ORDER — PREDNISONE 20 MG PO TABS
40.0000 mg | ORAL_TABLET | Freq: Once | ORAL | Status: AC
  Administered 2020-01-13: 40 mg via ORAL
  Filled 2020-01-13: qty 2

## 2020-01-13 MED ORDER — ALBUTEROL SULFATE HFA 108 (90 BASE) MCG/ACT IN AERS: 1.0000 | INHALATION_SPRAY | RESPIRATORY_TRACT | 0 refills | Status: DC | PRN

## 2020-01-13 MED ORDER — PREDNISONE 10 MG PO TABS: 40.0000 mg | ORAL_TABLET | Freq: Every day | ORAL | 0 refills | Status: DC

## 2020-01-13 MED ORDER — DOXYCYCLINE HYCLATE 100 MG PO CAPS: 100.0000 mg | ORAL_CAPSULE | Freq: Two times a day (BID) | ORAL | 0 refills | Status: DC

## 2020-01-13 MED ORDER — ALBUTEROL SULFATE HFA 108 (90 BASE) MCG/ACT IN AERS
4.0000 | INHALATION_SPRAY | Freq: Once | RESPIRATORY_TRACT | Status: AC
  Administered 2020-01-13: 23:00:00 4 via RESPIRATORY_TRACT
  Filled 2020-01-13: qty 6.7

## 2020-01-13 MED ORDER — DOXYCYCLINE HYCLATE 100 MG PO TABS
100.0000 mg | ORAL_TABLET | Freq: Once | ORAL | Status: AC
  Administered 2020-01-13: 100 mg via ORAL
  Filled 2020-01-13: qty 1

## 2020-01-13 NOTE — ED Triage Notes (Signed)
Pt also c/o non-productive cough

## 2020-01-13 NOTE — ED Notes (Signed)
Discharge instructions reviewed with pt. Pt verbalized understanding.   

## 2020-01-13 NOTE — ED Provider Notes (Signed)
[facility] EMERGENCY DEPARTMENT Provider Note   CSN: [id number] Arrival date & time: 01/13/20  1855     History Chief Complaint  Patient presents with  . Shortness of Breath    Gregory Bishop is a 44 y.o. male.  The history is provided by the patient and medical records. No language interpreter was used.  Shortness of Breath  Gregory Bishop is a 44 y.o. male who presents to the Emergency Department complaining of sob.  He presents to the ED for evaluation of sob that started yesterday. He developed SOB yesterday with associated hot flashes. No reports of fevers. He does have associated cough. He has chest soreness when he does cough. He denies any nausea, vomiting, abdominal pain, leg swelling or pain. He has no medical problems and takes no medications. He does smoke cigarettes. He does not use alcohol or drugs. He had similar episode in December and was told he had an upper respiratory infection. He tested negative for COVID-19 at that time. No known COVID 19 exposures. He does have a history of asthma as a child.    Past Medical History:  Diagnosis Date  . Stroke [facility])     There are no problems to display for this patient.   Past Surgical History:  Procedure Laterality Date  . LEG SURGERY Left 2014       No family history on file.  Social History   Tobacco Use  . Smoking status: Current Every Day Smoker    Packs/day: 0.50  . Smokeless tobacco: Never Used  Substance Use Topics  . Alcohol use: Yes    Comment: daily  . Drug use: No    Home Medications Prior to Admission medications   Medication Sig Start Date End Date Taking? Authorizing Provider  albuterol (VENTOLIN HFA) 108 (90 Base) MCG/ACT inhaler Inhale 1-2 puffs into the lungs every 4 (four) hours as needed for wheezing or shortness of breath. 01/13/20   Quintella Reichert, MD  benzonatate (TESSALON) 100 MG capsule Take 1 capsule (100 mg total) by mouth every 8 (eight) hours. 10/03/19   Charlann Lange, PA-C  chlordiazePOXIDE (LIBRIUM) 25 MG capsule 50mg  PO TID x 1D, then 25-50mg  PO BID X 1D, then 25-50mg  PO QD X 1D 04/08/19   Caccavale, Sophia, PA-C  doxycycline (VIBRAMYCIN) 100 MG capsule Take 1 capsule (100 mg total) by mouth 2 (two) times daily. 01/13/20   Quintella Reichert, MD  naproxen (NAPROSYN) 250 MG tablet Take 1 tablet (250 mg total) by mouth 2 (two) times daily with a meal. 10/16/16   Waynetta Pean, PA-C  ondansetron (ZOFRAN) 4 MG tablet Take 1 tablet (4 mg total) by mouth every 8 (eight) hours as needed for nausea or vomiting. 04/08/19   Caccavale, Sophia, PA-C  penicillin v potassium (VEETID) 500 MG tablet Take 1 tablet (500 mg total) by mouth 4 (four) times daily. 10/16/16   Waynetta Pean, PA-C  predniSONE (DELTASONE) 10 MG tablet Take 4 tablets (40 mg total) by mouth daily. 01/13/20   Quintella Reichert, MD    Allergies    Patient has no known allergies.  Review of Systems   Review of Systems  Respiratory: Positive for shortness of breath.   All other systems reviewed and are negative.   Physical Exam Updated Vital Signs BP 116/70   Pulse 86   Temp (!) 97.5 F (36.4 C) (Oral)   Resp 13   Ht 6\' 1"  (1.854 m)   Wt 70.3 kg   SpO2 96%  BMI 20.45 kg/m   Physical Exam Vitals and nursing note reviewed.  Constitutional:      Appearance: He is well-developed.  HENT:     Head: Normocephalic and atraumatic.  Cardiovascular:     Rate and Rhythm: Regular rhythm. Tachycardia present.     Heart sounds: No murmur.  Pulmonary:     Effort: Pulmonary effort is normal. No respiratory distress.     Comments: Diffuse wheezes Abdominal:     Palpations: Abdomen is soft.     Tenderness: There is no abdominal tenderness. There is no guarding or rebound.  Musculoskeletal:        General: No swelling or tenderness.  Skin:    General: Skin is warm and dry.  Neurological:     Mental Status: He is alert and oriented to person, place, and time.  Psychiatric:        Behavior:  Behavior normal.     ED Results / Procedures / Treatments   Labs (all labs ordered are listed, but only abnormal results are displayed) Labs Reviewed  CBC WITH DIFFERENTIAL/PLATELET - Abnormal; Notable for the following components:      Result Value   WBC 16.2 (*)    Neutro Abs 11.5 (*)    Monocytes Absolute 1.2 (*)    All other components within normal limits  COMPREHENSIVE METABOLIC PANEL - Abnormal; Notable for the following components:   Glucose, Bld 142 (*)    All other components within normal limits  SARS CORONAVIRUS 2 (TAT 6-24 HRS)  TROPONIN I (HIGH SENSITIVITY)  TROPONIN I (HIGH SENSITIVITY)    EKG EKG Interpretation  Date/Time:  Saturday January 13 2020 18:57:29 EDT Ventricular Rate:  119 PR Interval:  142 QRS Duration: 78 QT Interval:  292 QTC Calculation: 410 R Axis:   96 Text Interpretation: Sinus tachycardia Right atrial enlargement Rightward axis Minimal voltage criteria for LVH, may be normal variant ( Cornell product ) Borderline ECG Confirmed by Tilden Fossa [phone]) on 01/13/2020 11:37:58 PM   Radiology DG Chest 2 View  Result Date: 01/13/2020 CLINICAL DATA:  Shortness of breath. EXAM: CHEST - 2 VIEW COMPARISON:  10/02/2019 FINDINGS: Borderline chronic hyperinflation.The cardiomediastinal contours are normal. The lungs are clear. Pulmonary vasculature is normal. No consolidation, pleural effusion, or pneumothorax. No acute osseous abnormalities are seen. IMPRESSION: No acute chest findings. Electronically Signed   By: Narda Rutherford M.D.   On: 01/13/2020 19:22    Procedures Procedures (including critical care time)  Medications Ordered in ED Medications  doxycycline (VIBRA-TABS) tablet 100 mg (has no administration in time range)  predniSONE (DELTASONE) tablet 40 mg (40 mg Oral Given 01/13/20 2236)  albuterol (VENTOLIN HFA) 108 (90 Base) MCG/ACT inhaler 4 puff (4 puffs Inhalation Given 01/13/20 2236)    ED Course  I have reviewed the triage vital  signs and the nursing notes.  Pertinent labs & imaging results that were available during my care of the patient were reviewed by me and considered in my medical decision making (see chart for details).    MDM Rules/Calculators/A&P                     Patient here for evaluation of shortness of breath since yesterday. He does have wheezing on evaluation but good air movement bilaterally. No evidence of pneumonia. CBC with leukocytosis, similar when compared to priors. Following albuterol treatment and steroids in the emergency department he is feeling improved. Repeat lung exam with good air movement bilaterally, occasional and expiratory  wheezes. Discussed with patient home care for reactive airway, concern for possible asthma. Discussed importance of PCP follow-up, continuing medications as well as return precautions. He has no known COVID 19 exposures, but given his respiratory symptoms will send COVID swab.  Presentation is not consistent with PE.   Corrin Hingle was evaluated in Emergency Department on 01/13/2020 for the symptoms described in the history of present illness. He was evaluated in the context of the global COVID-19 pandemic, which necessitated consideration that the patient might be at risk for infection with the SARS-CoV-2 virus that causes COVID-19. Institutional protocols and algorithms that pertain to the evaluation of patients at risk for COVID-19 are in a state of rapid change based on information released by regulatory bodies including the CDC and federal and state organizations. These policies and algorithms were followed during the patient's care in the ED.  Final Clinical Impression(s) / ED Diagnoses Final diagnoses:  Shortness of breath  Mild intermittent reactive airway disease with acute exacerbation    Rx / DC Orders ED Discharge Orders         Ordered    predniSONE (DELTASONE) 10 MG tablet  Daily     01/13/20 2345    albuterol (VENTOLIN HFA) 108 (90 Base)  MCG/ACT inhaler  Every 4 hours PRN     01/13/20 2345    doxycycline (VIBRAMYCIN) 100 MG capsule  2 times daily     01/13/20 2345           Tilden Fossa, MD 01/13/20 2348

## 2020-01-13 NOTE — ED Triage Notes (Signed)
Pt to ED with c/o shortness of breath.  St's he was fine when he went to bed then woke up short of breath with hot flashes. Pt denies nausea or vomiting.  Denies pain

## 2020-01-14 LAB — SARS CORONAVIRUS 2 (TAT 6-24 HRS): SARS Coronavirus 2: NEGATIVE

## 2020-07-31 ENCOUNTER — Encounter (HOSPITAL_COMMUNITY): Payer: Self-pay

## 2020-07-31 ENCOUNTER — Other Ambulatory Visit: Payer: Self-pay

## 2020-07-31 ENCOUNTER — Emergency Department (HOSPITAL_COMMUNITY)
Admission: EM | Admit: 2020-07-31 | Discharge: 2020-07-31 | Disposition: A | Discharge status: Home / Self Care | Payer: Self-pay | Attending: Emergency Medicine | Admitting: Emergency Medicine

## 2020-07-31 ENCOUNTER — Telehealth: Payer: Self-pay | Admitting: *Deleted

## 2020-07-31 DIAGNOSIS — F10239 Alcohol dependence with withdrawal, unspecified: Secondary | ICD-10-CM | POA: Insufficient documentation

## 2020-07-31 DIAGNOSIS — F10930 Alcohol use, unspecified with withdrawal, uncomplicated: Secondary | ICD-10-CM

## 2020-07-31 DIAGNOSIS — F10230 Alcohol dependence with withdrawal, uncomplicated: Secondary | ICD-10-CM

## 2020-07-31 DIAGNOSIS — F10231 Alcohol dependence with withdrawal delirium: Secondary | ICD-10-CM | POA: Insufficient documentation

## 2020-07-31 DIAGNOSIS — R000 Tachycardia, unspecified: Secondary | ICD-10-CM | POA: Insufficient documentation

## 2020-07-31 DIAGNOSIS — F17200 Nicotine dependence, unspecified, uncomplicated: Secondary | ICD-10-CM | POA: Insufficient documentation

## 2020-07-31 MED ORDER — ONDANSETRON HCL 4 MG PO TABS: 4.0000 mg | ORAL_TABLET | Freq: Three times a day (TID) | ORAL | 0 refills | Status: DC | PRN

## 2020-07-31 MED ORDER — CHLORDIAZEPOXIDE HCL 25 MG PO CAPS: ORAL_CAPSULE | ORAL | 0 refills | Status: DC

## 2020-07-31 MED ORDER — ONDANSETRON 8 MG PO TBDP
8.0000 mg | ORAL_TABLET | Freq: Once | ORAL | Status: AC
  Administered 2020-07-31: 8 mg via ORAL
  Filled 2020-07-31: qty 1

## 2020-07-31 MED ORDER — LORAZEPAM 1 MG PO TABS
2.0000 mg | ORAL_TABLET | Freq: Once | ORAL | Status: AC
  Administered 2020-07-31: 2 mg via ORAL
  Filled 2020-07-31: qty 2

## 2020-07-31 NOTE — Telephone Encounter (Signed)
Pharmacy called related to Rx: Librium quantity .Marland KitchenMarland KitchenEDCM clarified with EDP to change Rx to: #12.

## 2020-07-31 NOTE — ED Provider Notes (Signed)
[facility]-EMERGENCY DEPT Provider Note   CSN: [id number] Arrival date & time: 07/31/20  1116     History Chief Complaint  Patient presents with  . Delirium Tremens (DTS)    Gregory Bishop is a 44 y.o. male.  HPI   Patient presented to the emergency room for evaluation of alcohol withdrawal.  Patient states he has a history of alcohol abuse.  He started drinking heavily again recently.  Patient states he has been drinking several beers daily.  Patient last drank yesterday.  He is now feeling shaky and feels like he is withdrawing.  Patient has never had issues with seizures.  He denies any history of DTs.  Patient has contacted the treatment center and is post to go to that center later this afternoon.  Past Medical History:  Diagnosis Date  . Stroke [facility])     There are no problems to display for this patient.   Past Surgical History:  Procedure Laterality Date  . LEG SURGERY Left 2014       No family history on file.  Social History   Tobacco Use  . Smoking status: Current Every Day Smoker    Packs/day: 0.50  . Smokeless tobacco: Never Used  Substance Use Topics  . Alcohol use: Yes    Alcohol/week: 6.0 standard drinks    Types: 6 Cans of beer per week    Comment: daily  . Drug use: No    Home Medications Prior to Admission medications   Medication Sig Start Date End Date Taking? Authorizing Provider  albuterol (VENTOLIN HFA) 108 (90 Base) MCG/ACT inhaler Inhale 1-2 puffs into the lungs every 4 (four) hours as needed for wheezing or shortness of breath. 01/13/20   Tilden Fossa, MD  chlordiazePOXIDE (LIBRIUM) 25 MG capsule 50mg  PO TID x 1D, then 25-50mg  PO BID X 1D, then 25-50mg  PO QD X 1D 07/31/20   08/02/20, MD  ondansetron (ZOFRAN) 4 MG tablet Take 1 tablet (4 mg total) by mouth every 8 (eight) hours as needed for nausea or vomiting. 07/31/20   08/02/20, MD    Allergies    Patient has no known allergies.  Review of Systems     Review of Systems  All other systems reviewed and are negative.   Physical Exam Updated Vital Signs BP (!) 121/101 (BP Location: Right Arm)   Pulse (!) 105   Temp 98.8 F (37.1 C) (Oral)   Resp 20   Ht 1.854 m (6\' 1" )   Wt 65.8 kg   SpO2 92%   BMI 19.13 kg/m   Physical Exam Vitals and nursing note reviewed.  Constitutional:      General: He is not in acute distress.    Appearance: He is well-developed.  HENT:     Head: Normocephalic and atraumatic.     Right Ear: External ear normal.     Left Ear: External ear normal.  Eyes:     General: No scleral icterus.       Right eye: No discharge.        Left eye: No discharge.     Conjunctiva/sclera: Conjunctivae normal.  Neck:     Trachea: No tracheal deviation.  Cardiovascular:     Rate and Rhythm: Regular rhythm. Tachycardia present.  Pulmonary:     Effort: Pulmonary effort is normal. No respiratory distress.     Breath sounds: Normal breath sounds. No stridor. No wheezing or rales.  Abdominal:     General: Bowel sounds  are normal. There is no distension.     Palpations: Abdomen is soft.     Tenderness: There is no abdominal tenderness. There is no guarding or rebound.  Musculoskeletal:        General: No tenderness.     Cervical back: Neck supple.  Skin:    General: Skin is warm and dry.     Findings: No rash.  Neurological:     Mental Status: He is alert.     Cranial Nerves: No cranial nerve deficit (no facial droop, extraocular movements intact, no slurred speech).     Sensory: No sensory deficit.     Motor: Tremor present. No abnormal muscle tone or seizure activity.     Coordination: Coordination normal.     ED Results / Procedures / Treatments    Procedures Procedures (including critical care time)  Medications Ordered in ED Medications  LORazepam (ATIVAN) tablet 2 mg (has no administration in time range)  ondansetron (ZOFRAN-ODT) disintegrating tablet 8 mg (has no administration in time range)     ED Course  I have reviewed the triage vital signs and the nursing notes.  Pertinent labs & imaging results that were available during my care of the patient were reviewed by me and considered in my medical decision making (see chart for details).    MDM Rules/Calculators/A&P                          Patient presented to ED with complaints of alcohol withdrawal.  Patient does not have any history of DTs.  He is alert and awake.  He does have signs of tremulousness and withdrawal but no signs to suggest delirium tremens.  Patient has plans to go to an alcohol treatment center this afternoon.  I will give him a dose of Ativan and given prescriptions for Librium and Zofran Final Clinical Impression(s) / ED Diagnoses Final diagnoses:  Alcohol withdrawal syndrome without complication (HCC)    Rx / DC Orders ED Discharge Orders         Ordered    chlordiazePOXIDE (LIBRIUM) 25 MG capsule        07/31/20 1259    ondansetron (ZOFRAN) 4 MG tablet  Every 8 hours PRN        07/31/20 1259           Linwood Dibbles, MD 07/31/20 1301

## 2020-09-24 ENCOUNTER — Ambulatory Visit (HOSPITAL_COMMUNITY): Payer: No Payment, Other | Admitting: Behavioral Health

## 2021-01-30 ENCOUNTER — Other Ambulatory Visit: Payer: Self-pay

## 2021-01-30 ENCOUNTER — Emergency Department (HOSPITAL_COMMUNITY): Payer: Medicaid Other

## 2021-01-30 ENCOUNTER — Emergency Department (HOSPITAL_COMMUNITY)
Admission: EM | Admit: 2021-01-30 | Discharge: 2021-01-31 | Disposition: A | Discharge status: Home / Self Care | Payer: Medicaid Other | Attending: Emergency Medicine | Admitting: Emergency Medicine

## 2021-01-30 ENCOUNTER — Encounter (HOSPITAL_COMMUNITY): Payer: Self-pay

## 2021-01-30 DIAGNOSIS — F17200 Nicotine dependence, unspecified, uncomplicated: Secondary | ICD-10-CM | POA: Insufficient documentation

## 2021-01-30 DIAGNOSIS — R0602 Shortness of breath: Secondary | ICD-10-CM | POA: Insufficient documentation

## 2021-01-30 DIAGNOSIS — Z20822 Contact with and (suspected) exposure to covid-19: Secondary | ICD-10-CM | POA: Insufficient documentation

## 2021-01-30 DIAGNOSIS — R062 Wheezing: Secondary | ICD-10-CM | POA: Insufficient documentation

## 2021-01-30 LAB — POC SARS CORONAVIRUS 2 AG -  ED: SARSCOV2ONAVIRUS 2 AG: NEGATIVE

## 2021-01-30 MED ORDER — IPRATROPIUM-ALBUTEROL 0.5-2.5 (3) MG/3ML IN SOLN
3.0000 mL | Freq: Once | RESPIRATORY_TRACT | Status: AC
  Administered 2021-01-30: 3 mL via RESPIRATORY_TRACT
  Filled 2021-01-30: qty 3

## 2021-01-30 NOTE — ED Notes (Signed)
Pt states his breathing feels "much better". Pt denies SOB at this time.

## 2021-01-30 NOTE — ED Triage Notes (Signed)
Pt came in with c/o SOB that started yesterday. Pt states that he gets in coughing fits, and his difficulty breathing is exacerbated. Pt O2 saturations 91 to 95% on RA. Pt states he took two prednisone an hour and a half ago. Pt states he took albuterol inhaler as well.

## 2021-01-30 NOTE — ED Provider Notes (Signed)
MSE was initiated and I personally evaluated the patient and placed orders (if any) at  7:39 PM on January 30, 2021.  The patient appears stable so that the remainder of the MSE may be completed by another provider.  Patient placed in Quick Look pathway, seen and evaluated   Chief Complaint: SOB  HPI:   45 y.o. M who presents for evaluation of shortness of breath began yesterday.  He states that he will get into a coughing fit when he feels like his breathing is getting faster and harder.  He states that he does not have a history of asthma but has had bronchitis before and has inhalers that he uses.  He has been using them with minimal improvement.  He also reports he has been taking prednisone.  He states that he thought initially was allergies.  He has had some rhinorrhea, congestion.  He denies any fevers, chest pain. He denies any exogenous hormone use, recent immobilization, prior history of DVT/PE, recent surgery, leg swelling, or long travel.  ROS: SOB  Physical Exam:   Gen: No distress  Neuro: Awake and Alert  Skin: Warm    Focused Exam: Expiratory wheezing noted diffusely throughout all lung fields.  Able to speak in full sentences without any difficulty.   Initiation of care has begun. The patient has been counseled on the process, plan, and necessity for staying for the completion/evaluation, and the remainder of the medical screening examination  Portions of this note were generated with Dragon dictation software. Dictation errors may occur despite best attempts at proofreading.     Maxwell Caul, PA-C 01/30/21 1940    Milagros Loll, MD 01/30/21 2018

## 2021-01-31 MED ORDER — ALBUTEROL SULFATE HFA 108 (90 BASE) MCG/ACT IN AERS
2.0000 | INHALATION_SPRAY | RESPIRATORY_TRACT | Status: DC
  Administered 2021-01-31: 2 via RESPIRATORY_TRACT
  Filled 2021-01-31: qty 6.7

## 2021-01-31 MED ORDER — DEXAMETHASONE 4 MG PO TABS
10.0000 mg | ORAL_TABLET | Freq: Once | ORAL | Status: AC
  Administered 2021-01-31: 10 mg via ORAL
  Filled 2021-01-31: qty 2

## 2021-01-31 NOTE — ED Provider Notes (Signed)
[facility]-EMERGENCY DEPT Provider Note   CSN: [id number] Arrival date & time: 01/30/21  4098     History Chief Complaint  Patient presents with  . Shortness of Breath    Gregory Bishop is a 45 y.o. male.  Patient with history of bronchitis, tobacco abuse, presents with SOB and wheezing that started tonight. No fever, congestion. Reports rhinorrhea that he attributed to allergies. No history of asthma but has used inhalers for bronchitis but this has not relieved symptoms tonight. No chest pain, vomiting.   The history is provided by the patient. No language interpreter was used.       Past Medical History:  Diagnosis Date  . Stroke [facility])     There are no problems to display for this patient.   Past Surgical History:  Procedure Laterality Date  . LEG SURGERY Left 2014       History reviewed. No pertinent family history.  Social History   Tobacco Use  . Smoking status: Current Every Day Smoker    Packs/day: 0.50  . Smokeless tobacco: Never Used  Substance Use Topics  . Alcohol use: Yes    Alcohol/week: 6.0 standard drinks    Types: 6 Cans of beer per week    Comment: daily  . Drug use: No    Home Medications Prior to Admission medications   Medication Sig Start Date End Date Taking? Authorizing Provider  albuterol (VENTOLIN HFA) 108 (90 Base) MCG/ACT inhaler Inhale 1-2 puffs into the lungs every 4 (four) hours as needed for wheezing or shortness of breath. 01/13/20  Yes Tilden Fossa, MD  chlordiazePOXIDE (LIBRIUM) 25 MG capsule 50mg  PO TID x 1D, then 25-50mg  PO BID X 1D, then 25-50mg  PO QD X 1D Patient not taking: Reported on 01/31/2021 07/31/20   08/02/20, MD  ondansetron (ZOFRAN) 4 MG tablet Take 1 tablet (4 mg total) by mouth every 8 (eight) hours as needed for nausea or vomiting. Patient not taking: Reported on 01/31/2021 07/31/20   08/02/20, MD    Allergies    Patient has no known allergies.  Review of Systems   Review of  Systems  Constitutional: Negative for chills and fever.  HENT: Positive for rhinorrhea.   Respiratory: Positive for cough, shortness of breath and wheezing.   Cardiovascular: Negative.  Negative for chest pain.  Gastrointestinal: Negative.  Negative for abdominal pain and nausea.  Musculoskeletal: Negative.   Skin: Negative.   Neurological: Negative.     Physical Exam Updated Vital Signs BP (!) 116/91 (BP Location: Left Arm)   Pulse 88   Temp 98.5 F (36.9 C) (Oral)   Resp 16   Ht 6\' 1"  (1.854 m)   Wt 67.6 kg   SpO2 96%   BMI 19.66 kg/m   Physical Exam Vitals reviewed.  Constitutional:      Appearance: He is well-developed.  HENT:     Head: Normocephalic.  Cardiovascular:     Rate and Rhythm: Normal rate and regular rhythm.  Pulmonary:     Effort: Pulmonary effort is normal. No tachypnea.     Breath sounds: Normal breath sounds. No wheezing, rhonchi or rales.     Comments: Examined after nebulizer treatment.  Chest:     Chest wall: No tenderness.  Abdominal:     General: Bowel sounds are normal.     Palpations: Abdomen is soft.     Tenderness: There is no abdominal tenderness. There is no guarding or rebound.  Musculoskeletal:  General: Normal range of motion.     Cervical back: Normal range of motion and neck supple.  Skin:    General: Skin is warm and dry.  Neurological:     General: No focal deficit present.     Mental Status: He is alert and oriented to person, place, and time.     ED Results / Procedures / Treatments   Labs (all labs ordered are listed, but only abnormal results are displayed) Labs Reviewed  POC SARS CORONAVIRUS 2 AG -  ED    EKG None  Radiology DG Chest Portable 1 View  Result Date: 01/30/2021 CLINICAL DATA:  Shortness of breath and cough EXAM: PORTABLE CHEST 1 VIEW COMPARISON:  01/13/2020 FINDINGS: Cardiac shadow is stable. The lungs are mildly hyperinflated but clear. No bony abnormality is seen. IMPRESSION: No acute  abnormality noted. Electronically Signed   By: Alcide Clever M.D.   On: 01/30/2021 19:54    Procedures Procedures   Medications Ordered in ED Medications  ipratropium-albuterol (DUONEB) 0.5-2.5 (3) MG/3ML nebulizer solution 3 mL (3 mLs Nebulization Given 01/30/21 2159)    ED Course  I have reviewed the triage vital signs and the nursing notes.  Pertinent labs & imaging results that were available during my care of the patient were reviewed by me and considered in my medical decision making (see chart for details).    MDM Rules/Calculators/A&P                          Patient to ED with wheezing and SOB. SMoker, h/o bronchitis, no h/o asthma. No fever.  He reports after single nebulizer treatment he feels much better. No wheezing to my exam. VSS, no hypoxia.   He is felt appropriate for discharge home. Inhaler provided.   Final Clinical Impression(s) / ED Diagnoses Final diagnoses:  None   1. Wheezing  Rx / DC Orders ED Discharge Orders    None       Danne Harbor 01/31/21 0708    Arby Barrette, MD 01/31/21 [phone]

## 2021-01-31 NOTE — Discharge Instructions (Addendum)
Use your inhaler every 4 hours as needed.   If symptoms become worse and you have recurrent shortness of breath or wheezing, return to the emergency department at any time

## 2021-05-22 ENCOUNTER — Inpatient Hospital Stay (HOSPITAL_COMMUNITY)
Admission: EM | Admit: 2021-05-22 | Discharge: 2021-05-24 | DRG: 312 | Disposition: A | Discharge status: Home / Self Care | Payer: Self-pay | Attending: Internal Medicine | Admitting: Internal Medicine

## 2021-05-22 ENCOUNTER — Observation Stay (HOSPITAL_COMMUNITY): Payer: Self-pay

## 2021-05-22 ENCOUNTER — Encounter (HOSPITAL_COMMUNITY): Payer: Self-pay

## 2021-05-22 ENCOUNTER — Other Ambulatory Visit: Payer: Self-pay

## 2021-05-22 ENCOUNTER — Emergency Department (HOSPITAL_COMMUNITY): Payer: Self-pay

## 2021-05-22 DIAGNOSIS — R42 Dizziness and giddiness: Secondary | ICD-10-CM

## 2021-05-22 DIAGNOSIS — F10288 Alcohol dependence with other alcohol-induced disorder: Secondary | ICD-10-CM

## 2021-05-22 DIAGNOSIS — Z8673 Personal history of transient ischemic attack (TIA), and cerebral infarction without residual deficits: Secondary | ICD-10-CM

## 2021-05-22 DIAGNOSIS — I951 Orthostatic hypotension: Principal | ICD-10-CM | POA: Diagnosis present

## 2021-05-22 DIAGNOSIS — F10229 Alcohol dependence with intoxication, unspecified: Secondary | ICD-10-CM | POA: Diagnosis present

## 2021-05-22 DIAGNOSIS — Z8249 Family history of ischemic heart disease and other diseases of the circulatory system: Secondary | ICD-10-CM

## 2021-05-22 DIAGNOSIS — Y901 Blood alcohol level of 20-39 mg/100 ml: Secondary | ICD-10-CM | POA: Diagnosis present

## 2021-05-22 DIAGNOSIS — Z20822 Contact with and (suspected) exposure to covid-19: Secondary | ICD-10-CM | POA: Diagnosis present

## 2021-05-22 DIAGNOSIS — F10129 Alcohol abuse with intoxication, unspecified: Secondary | ICD-10-CM

## 2021-05-22 DIAGNOSIS — Z79899 Other long term (current) drug therapy: Secondary | ICD-10-CM

## 2021-05-22 DIAGNOSIS — A599 Trichomoniasis, unspecified: Secondary | ICD-10-CM | POA: Diagnosis present

## 2021-05-22 DIAGNOSIS — R270 Ataxia, unspecified: Secondary | ICD-10-CM

## 2021-05-22 DIAGNOSIS — F17210 Nicotine dependence, cigarettes, uncomplicated: Secondary | ICD-10-CM | POA: Diagnosis present

## 2021-05-22 LAB — MAGNESIUM: Magnesium: 2.3 mg/dL (ref 1.7–2.4)

## 2021-05-22 LAB — CBC WITH DIFFERENTIAL/PLATELET
Abs Immature Granulocytes: 0.02 10*3/uL (ref 0.00–0.07)
Basophils Absolute: 0 10*3/uL (ref 0.0–0.1)
Basophils Relative: 0 %
Eosinophils Absolute: 0 10*3/uL (ref 0.0–0.5)
Eosinophils Relative: 0 %
HCT: 40 % (ref 39.0–52.0)
Hemoglobin: 13.9 g/dL (ref 13.0–17.0)
Immature Granulocytes: 0 %
Lymphocytes Relative: 17 %
Lymphs Abs: 1.3 10*3/uL (ref 0.7–4.0)
MCH: 32.1 pg (ref 26.0–34.0)
MCHC: 34.8 g/dL (ref 30.0–36.0)
MCV: 92.4 fL (ref 80.0–100.0)
Monocytes Absolute: 0.6 10*3/uL (ref 0.1–1.0)
Monocytes Relative: 8 %
Neutro Abs: 5.6 10*3/uL (ref 1.7–7.7)
Neutrophils Relative %: 75 %
Platelets: 204 10*3/uL (ref 150–400)
RBC: 4.33 MIL/uL (ref 4.22–5.81)
RDW: 12.1 % (ref 11.5–15.5)
WBC: 7.6 10*3/uL (ref 4.0–10.5)
nRBC: 0 % (ref 0.0–0.2)

## 2021-05-22 LAB — RESP PANEL BY RT-PCR (FLU A&B, COVID) ARPGX2
Influenza A by PCR: NEGATIVE
Influenza B by PCR: NEGATIVE
SARS Coronavirus 2 by RT PCR: NEGATIVE

## 2021-05-22 LAB — COMPREHENSIVE METABOLIC PANEL
ALT: 94 U/L — ABNORMAL HIGH (ref 0–44)
ALT: 68 U/L — ABNORMAL HIGH (ref 0–44)
AST: 169 U/L — ABNORMAL HIGH (ref 15–41)
AST: 96 U/L — ABNORMAL HIGH (ref 15–41)
Albumin: 4.5 g/dL (ref 3.5–5.0)
Albumin: 3.9 g/dL (ref 3.5–5.0)
Alkaline Phosphatase: 81 U/L (ref 38–126)
Alkaline Phosphatase: 70 U/L (ref 38–126)
Anion gap: 13 (ref 5–15)
Anion gap: 8 (ref 5–15)
BUN: 8 mg/dL (ref 6–20)
BUN: 9 mg/dL (ref 6–20)
CO2: 26 mmol/L (ref 22–32)
CO2: 27 mmol/L (ref 22–32)
Calcium: 9.3 mg/dL (ref 8.9–10.3)
Calcium: 9.1 mg/dL (ref 8.9–10.3)
Chloride: 97 mmol/L — ABNORMAL LOW (ref 98–111)
Chloride: 101 mmol/L (ref 98–111)
Creatinine, Ser: 0.78 mg/dL (ref 0.61–1.24)
Creatinine, Ser: 0.8 mg/dL (ref 0.61–1.24)
GFR, Estimated: >60 mL/min (ref >60)
GFR, Estimated: >60 mL/min (ref >60)
Glucose, Bld: 158 mg/dL — ABNORMAL HIGH (ref 70–99)
Glucose, Bld: 143 mg/dL — ABNORMAL HIGH (ref 70–99)
Potassium: 3.7 mmol/L (ref 3.5–5.1)
Potassium: 3.6 mmol/L (ref 3.5–5.1)
Sodium: 136 mmol/L (ref 135–145)
Sodium: 136 mmol/L (ref 135–145)
Total Bilirubin: 0.9 mg/dL (ref 0.3–1.2)
Total Bilirubin: 1.1 mg/dL (ref 0.3–1.2)
Total Protein: 7.9 g/dL (ref 6.5–8.1)
Total Protein: 6.7 g/dL (ref 6.5–8.1)

## 2021-05-22 LAB — ETHANOL: Alcohol, Ethyl (B): 26 mg/dL — ABNORMAL HIGH (ref <10)

## 2021-05-22 MED ORDER — POLYETHYLENE GLYCOL 3350 17 G PO PACK
17.0000 g | PACK | Freq: Two times a day (BID) | ORAL | Status: AC
  Administered 2021-05-22: 17 g via ORAL
  Filled 2021-05-22: qty 1

## 2021-05-22 MED ORDER — LORAZEPAM 2 MG/ML IJ SOLN: 0.0000 mg | Freq: Two times a day (BID) | INTRAMUSCULAR | Status: DC

## 2021-05-22 MED ORDER — THIAMINE HCL 100 MG/ML IJ SOLN: 100.0000 mg | Freq: Every day | INTRAMUSCULAR | Status: DC

## 2021-05-22 MED ORDER — LORAZEPAM 1 MG PO TABS
0.0000 mg | ORAL_TABLET | Freq: Four times a day (QID) | ORAL | Status: AC
  Administered 2021-05-22: 1 mg via ORAL
  Filled 2021-05-22 (×2): qty 1

## 2021-05-22 MED ORDER — MAGNESIUM OXIDE -MG SUPPLEMENT 400 (240 MG) MG PO TABS
400.0000 mg | ORAL_TABLET | Freq: Two times a day (BID) | ORAL | Status: AC
  Administered 2021-05-22 (×2): 400 mg via ORAL
  Filled 2021-05-22 (×2): qty 1

## 2021-05-22 MED ORDER — POTASSIUM CHLORIDE CRYS ER 20 MEQ PO TBCR
40.0000 meq | EXTENDED_RELEASE_TABLET | Freq: Two times a day (BID) | ORAL | Status: AC
  Administered 2021-05-22 (×2): 40 meq via ORAL
  Filled 2021-05-22 (×2): qty 2

## 2021-05-22 MED ORDER — LORAZEPAM 2 MG/ML IJ SOLN: 1.0000 mg | INTRAMUSCULAR | Status: DC | PRN

## 2021-05-22 MED ORDER — ADULT MULTIVITAMIN W/MINERALS CH
1.0000 | ORAL_TABLET | Freq: Every day | ORAL | Status: DC
  Administered 2021-05-22 – 2021-05-24 (×3): 1 via ORAL
  Filled 2021-05-22 (×3): qty 1

## 2021-05-22 MED ORDER — ALBUTEROL SULFATE HFA 108 (90 BASE) MCG/ACT IN AERS: 1.0000 | INHALATION_SPRAY | RESPIRATORY_TRACT | Status: DC | PRN

## 2021-05-22 MED ORDER — ALBUTEROL SULFATE (2.5 MG/3ML) 0.083% IN NEBU: 2.5000 mg | INHALATION_SOLUTION | RESPIRATORY_TRACT | Status: DC | PRN

## 2021-05-22 MED ORDER — ENOXAPARIN SODIUM 40 MG/0.4ML IJ SOSY
40.0000 mg | PREFILLED_SYRINGE | INTRAMUSCULAR | Status: DC
  Administered 2021-05-22 – 2021-05-23 (×2): 40 mg via SUBCUTANEOUS
  Filled 2021-05-22: qty 0.4

## 2021-05-22 MED ORDER — THIAMINE HCL 100 MG PO TABS: 100.0000 mg | ORAL_TABLET | Freq: Every day | ORAL | Status: DC

## 2021-05-22 MED ORDER — ONDANSETRON HCL 4 MG/2ML IJ SOLN: 4.0000 mg | Freq: Four times a day (QID) | INTRAMUSCULAR | Status: DC | PRN

## 2021-05-22 MED ORDER — ONDANSETRON HCL 4 MG PO TABS: 4.0000 mg | ORAL_TABLET | Freq: Three times a day (TID) | ORAL | Status: DC | PRN

## 2021-05-22 MED ORDER — THIAMINE HCL 100 MG/ML IJ SOLN
500.0000 mg | Freq: Three times a day (TID) | INTRAVENOUS | Status: DC
  Administered 2021-05-22 – 2021-05-24 (×5): 500 mg via INTRAVENOUS
  Filled 2021-05-22 (×8): qty 5

## 2021-05-22 MED ORDER — THIAMINE HCL 100 MG/ML IJ SOLN
500.0000 mg | Freq: Once | INTRAVENOUS | Status: AC
  Administered 2021-05-22: 500 mg via INTRAVENOUS
  Filled 2021-05-22: qty 5

## 2021-05-22 MED ORDER — CHLORDIAZEPOXIDE HCL 25 MG PO CAPS
25.0000 mg | ORAL_CAPSULE | Freq: Three times a day (TID) | ORAL | Status: DC
  Administered 2021-05-22 – 2021-05-24 (×6): 25 mg via ORAL
  Filled 2021-05-22 (×7): qty 1

## 2021-05-22 MED ORDER — LORAZEPAM 1 MG PO TABS: 1.0000 mg | ORAL_TABLET | ORAL | Status: DC | PRN

## 2021-05-22 MED ORDER — LORAZEPAM 1 MG PO TABS: 0.0000 mg | ORAL_TABLET | Freq: Two times a day (BID) | ORAL | Status: DC

## 2021-05-22 MED ORDER — SODIUM CHLORIDE 0.9 % IV SOLN: INTRAVENOUS | Status: AC

## 2021-05-22 MED ORDER — FOLIC ACID 1 MG PO TABS
1.0000 mg | ORAL_TABLET | Freq: Every day | ORAL | Status: DC
  Administered 2021-05-22 – 2021-05-24 (×3): 1 mg via ORAL
  Filled 2021-05-22 (×3): qty 1

## 2021-05-22 MED ORDER — LORAZEPAM 2 MG/ML IJ SOLN: 0.0000 mg | Freq: Four times a day (QID) | INTRAMUSCULAR | Status: DC

## 2021-05-22 MED ORDER — THIAMINE HCL 100 MG/ML IJ SOLN
Freq: Once | INTRAVENOUS | Status: AC
  Filled 2021-05-22: qty 1000

## 2021-05-22 MED ORDER — LORAZEPAM 1 MG PO TABS
0.0000 mg | ORAL_TABLET | Freq: Four times a day (QID) | ORAL | Status: DC
Start: 2021-05-22 — End: 2021-05-22
  Administered 2021-05-22: 2 mg via ORAL
  Filled 2021-05-22: qty 2

## 2021-05-22 NOTE — ED Notes (Signed)
Patient to the bathroom for a bowel movement.

## 2021-05-22 NOTE — ED Triage Notes (Signed)
Per patient dizziness, shob and requesting STD check. Patient denies any ETOH but visitor said he has been binge drinking today.

## 2021-05-22 NOTE — ED Notes (Signed)
Patient is ready for transport.  

## 2021-05-22 NOTE — ED Notes (Signed)
Patient was given refreshments to eat. Family at bedside.

## 2021-05-22 NOTE — ED Provider Notes (Signed)
[facility]-EMERGENCY DEPT Provider Note   CSN: [id number] Arrival date & time: 05/22/21  1014     History Chief Complaint  Patient presents with   Dizziness    Gregory Bishop is a 45 y.o. male.  Gregory Bishop has a history of heavy drinking.  He tells me that he drinks at least 3 cans (24 ounces each) of beer per day and has done so for years.  He denied a desire for STD check.  This was recorded in nursing notes.  The history is provided by the patient.  Dizziness Quality:  Imbalance Severity:  Severe Onset quality:  Sudden Duration: just prior to arrival. 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   Past Surgical History:  Procedure Laterality Date   LEG SURGERY Left 2014       History reviewed. No pertinent family history.  Social History   Tobacco Use   Smoking status: Every Day    Packs/day: 0.50    Types: Cigarettes   Smokeless tobacco: Never  Substance Use Topics   Alcohol use: Yes    Alcohol/week: 6.0 standard drinks    Types: 6 Cans of beer per week    Comment: daily   Drug use: No    Home Medications Prior to Admission medications   Medication Sig Start Date End Date Taking? Authorizing Provider  albuterol (VENTOLIN HFA) 108 (90 Base) MCG/ACT inhaler Inhale 1-2 puffs into the lungs every 4 (four) hours as needed for wheezing or shortness of breath. 01/13/20   Tilden Fossa, MD   chlordiazePOXIDE (LIBRIUM) 25 MG capsule 50mg  PO TID x 1D, then 25-50mg  PO BID X 1D, then 25-50mg  PO QD X 1D Patient not taking: Reported on 01/31/2021 07/31/20   08/02/20, MD  ondansetron (ZOFRAN) 4 MG tablet Take 1 tablet (4 mg total) by mouth every 8 (eight) hours as needed for nausea or vomiting. Patient not taking: Reported on 01/31/2021 07/31/20   08/02/20, MD    Allergies    Patient has no known allergies.  Review of Systems   Review of Systems  Constitutional:  Negative for chills and fever.  HENT:  Negative for ear pain and sore throat.   Eyes:  Negative for pain and visual disturbance.  Respiratory:  Negative for cough and shortness of breath.   Cardiovascular:  Negative for chest pain and palpitations.  Gastrointestinal:  Positive for nausea and vomiting. Negative for abdominal pain, blood in stool and diarrhea.  Genitourinary:  Negative for dysuria and hematuria.  Musculoskeletal:  Negative for arthralgias and back pain.  Skin:  Negative for color change and rash.  Neurological:  Positive for dizziness. Negative for seizures, syncope and headaches.  All other systems reviewed and are negative.  Physical Exam Updated Vital Signs BP (!) 132/95 (BP Location: Right Arm)   Pulse 61   Temp 98.1 F (36.7 C) (Oral)   Resp 17   Ht 6'  1" (1.854 m)   Wt 65.8 kg   SpO2 100%   BMI 19.13 kg/m   Physical Exam Vitals and nursing note reviewed.  Constitutional:      Appearance: Normal appearance.  HENT:     Head: Normocephalic and atraumatic.  Eyes:     Extraocular Movements: Extraocular movements intact.     Pupils: Pupils are equal, round, and reactive to light.  Cardiovascular:     Rate and Rhythm: Normal rate and regular rhythm.     Heart sounds: Normal heart sounds.  Pulmonary:     Effort: Pulmonary effort is normal.     Breath sounds: Normal breath sounds.  Abdominal:     General: There is no distension.     Tenderness: There is no abdominal tenderness. There  is no guarding.  Musculoskeletal:     Cervical back: Normal range of motion.     Right lower leg: No edema.     Left lower leg: No edema.  Skin:    General: Skin is warm and dry.  Neurological:     Mental Status: He is alert.     Cranial Nerves: Cranial nerves are intact.     Sensory: Sensation is intact.     Motor: Motor function is intact.     Coordination: Finger-Nose-Finger Test normal.     Comments: Appears diffusely tremulous Wide-based gait but is able to walk without assistance.   Psychiatric:        Mood and Affect: Mood normal.        Behavior: Behavior normal.    ED Results / Procedures / Treatments   Labs (all labs ordered are listed, but only abnormal results are displayed) Labs Reviewed  COMPREHENSIVE METABOLIC PANEL - Abnormal; Notable for the following components:      Result Value   Chloride 97 (*)    Glucose, Bld 158 (*)    AST 169 (*)    ALT 94 (*)    All other components within normal limits  ETHANOL - Abnormal; Notable for the following components:   Alcohol, Ethyl (B) 26 (*)    All other components within normal limits  RESP PANEL BY RT-PCR (FLU A&B, COVID) ARPGX2  CBC WITH DIFFERENTIAL/PLATELET  MAGNESIUM  URINALYSIS, ROUTINE W REFLEX MICROSCOPIC  RAPID URINE DRUG SCREEN, HOSP PERFORMED    EKG EKG Interpretation  Date/Time:  Thursday May 22 2021 11:50:43 EDT Ventricular Rate:  61 PR Interval:  157 QRS Duration: 90 QT Interval:  452 QTC Calculation: 456 R Axis:   84 Text Interpretation: Sinus rhythm Left ventricular hypertrophy ST elev, probable normal early repol pattern no acute ischemia Confirmed by Pieter Partridge ([id number]) on 05/22/2021 1:01:05 PM  Radiology CT Head Wo Contrast  Result Date: 05/22/2021 CLINICAL DATA:  Acute neuro deficit.  Dizziness and weakness. EXAM: CT HEAD WITHOUT CONTRAST TECHNIQUE: Contiguous axial images were obtained from the base of the skull through the vertex without intravenous contrast. COMPARISON:  None.  FINDINGS: Brain: No evidence of acute infarction, hemorrhage, hydrocephalus, extra-axial collection or mass lesion/mass effect. Vascular: Negative for hyperdense vessel Skull: Negative Sinuses/Orbits: Mild mucosal edema paranasal sinuses with prior surgery. Negative for orbital lesion Other: None IMPRESSION: Negative CT head Electronically Signed   By: Marlan Palau M.D.   On: 05/22/2021 12:59    Procedures Procedures   Medications Ordered in ED Medications  thiamine 500mg  in normal saline (50ml) IVPB (has no administration in time range)  LORazepam (ATIVAN) injection 0-4 mg (has no administration in time  range)    Or  LORazepam (ATIVAN) tablet 0-4 mg (has no administration in time range)  LORazepam (ATIVAN) injection 0-4 mg (has no administration in time range)    Or  LORazepam (ATIVAN) tablet 0-4 mg (has no administration in time range)    ED Course  I have reviewed the triage vital signs and the nursing notes.  Pertinent labs & imaging results that were available during my care of the patient were reviewed by me and considered in my medical decision making (see chart for details).  Clinical Course as of 05/22/21 1457  Thu May 22, 2021  1440 Patient still quite ataxic.  [AW]  1456 I spoke with [facility] about this patient. [AW]    Clinical Course User Index [AW] Koleen Distance, MD   MDM Rules/Calculators/A&P                           [name] presented with sudden onset of ataxia.  He has a significant alcohol use history but denied acute intoxication.  Aside from ataxia, he had no focal neurologic deficits.  He was evaluated for evidence of electrolyte abnormalities, acute intracranial pathology, and intoxication.  Alcohol level low, and he was still ataxic several hours after this was drawn.  Was initially concern for Warnicke syndrome, and he received high-dose thiamine.  Other possible etiologies of symptoms include stroke or other intracranial process.  MRI currently pending.   Plan is to admit for consideration of IV thiamine. Final Clinical Impression(s) / ED Diagnoses Final diagnoses:  Ataxia  Alcohol dependence with other alcohol-induced disorder [facility])    Rx / DC Orders ED Discharge Orders     None        Koleen Distance, MD 05/22/21 1500

## 2021-05-22 NOTE — TOC CAGE-AID Note (Signed)
Transition of Care [facility]) - CAGE-AID Screening   Patient Details  Name: Gregory Bishop MRN: [id number] Date of Birth: 1976-01-24  [facility] of [facility]) CM/SW Contact:    Joanne Chars, LCSW Phone Number: 05/22/2021, 5:58 PM   Clinical Narrative:  CSW met with pt to complete Cage Aid.  Pt reports he drinks 5 days per week, typically 5 24oz beers per time.  Pt reports this has been going on for the past 2 years.  Pt did have 16 months of sobriety prior to this after he attended [facility] residential treatment and got involved with AA.  Pt reports that both were helpful and he attended a lot of zoom AA meetings, including having a sponsor.  Pt reports he no longer has his sponsor phone number.  Pt reports that he eventually returned to drinking because he "missed it."  Pt reports he is concerned with the current issue today where he has been unable to walk.  Pt has also considered the impact that his drinking is having on his overall health.  CSW provided resource list and highlighted both outpt and residential providers that can work with pt despite his lack of health insurance. Pt engaged in discussion today and willing to consider the impact his alcohol use is having.       CAGE-AID Screening:    Have You Ever Felt You Ought to Cut Down on Your Drinking or Drug Use?: Yes Have People Annoyed You By Critizing Your Drinking Or Drug Use?: Yes Have You Felt Bad Or Guilty About Your Drinking Or Drug Use?: Yes Have You Ever Had a Drink or Used Drugs First Thing In The Morning to Steady Your Nerves or to Get Rid of a Hangover?: No CAGE-AID Score: 3  Substance Abuse Education Offered: Yes  Substance abuse interventions: Patient Counseling, Other (must comment) (local treatment resource list)

## 2021-05-22 NOTE — H&P (Signed)
History and Physical  Ismeal Heider [id number] DOB: 05/14/1976 DOA: 05/22/2021  PCP: Patient, No Pcp Per (Inactive) Patient coming from: Home  I have personally briefly reviewed patient's old medical records in [facility] Link   Chief Complaint: Dizziness  HPI: Gregory Bishop is a 45 y.o. male no significant past medical history except for heavy drinker at least 3 cans of 24 ounce beer per day for the last several years comes in for dizziness and imbalance that started on the day of admission he denies any fall loss of consciousness chest pain nausea vomiting or diarrhea.  In the ED: Labs are unremarkable CT scan of the head showed no acute findings. The ED physician has ordered an MRI of the brain   Review of Systems: All systems reviewed and apart from history of presenting illness, are negative.  Past Medical History:  Diagnosis Date   Stroke [facility])    Past Surgical History:  Procedure Laterality Date   LEG SURGERY Left 2014   Social History:  reports that he has been smoking. He has been smoking an average of .5 packs per day. He has never used smokeless tobacco. He reports current alcohol use of about 6.0 standard drinks per week. He reports that he does not use drugs.   No Known Allergies  History reviewed. No pertinent family history.  Father died of a heart attack  Prior to Admission medications   Medication Sig Start Date End Date Taking? Authorizing Provider  albuterol (VENTOLIN HFA) 108 (90 Base) MCG/ACT inhaler Inhale 1-2 puffs into the lungs every 4 (four) hours as needed for wheezing or shortness of breath. 01/13/20   Tilden Fossa, MD  chlordiazePOXIDE (LIBRIUM) 25 MG capsule 50mg  PO TID x 1D, then 25-50mg  PO BID X 1D, then 25-50mg  PO QD X 1D Patient not taking: Reported on 01/31/2021 07/31/20   08/02/20, MD  ondansetron (ZOFRAN) 4 MG tablet Take 1 tablet (4 mg total) by mouth every 8 (eight) hours as needed for nausea or vomiting. Patient not taking:  Reported on 01/31/2021 07/31/20   08/02/20, MD   Physical Exam: Vitals:   05/22/21 1448 05/22/21 1449 05/22/21 1450 05/22/21 1457  BP:    125/84  Pulse: 62 60 69 66  Resp: 12 12 16 14   Temp:      TempSrc:      SpO2: 100% 99% 99% 100%  Weight:      Height:        General exam: Moderately built and nourished patient, lying comfortably supine on the gurney in no obvious distress. Head, eyes and ENT: Nontraumatic and normocephalic. Pupils equally reacting to light and accommodation. Oral mucosa moist. Neck: Supple. No JVD, carotid bruit or thyromegaly. Lymphatics: No lymphadenopathy. Respiratory system: Clear to auscultation. No increased work of breathing. Cardiovascular system: S1 and S2 heard, RRR. No JVD, murmurs, gallops, clicks or pedal edema. Gastrointestinal system: Abdomen is nondistended, soft and nontender. Normal bowel sounds heard. No organomegaly or masses appreciated. Central nervous system: He is awake alert and oriented x3 no nystagmus 5 out of 5 in all 4 extremities symmetrical deep tendon reflexes present Extremities: Symmetric 5 x 5 power. Peripheral pulses symmetrically felt.  Skin: No rashes or acute findings. Musculoskeletal system: Negative exam. Psychiatry: Pleasant and cooperative.   Labs on Admission:  Basic Metabolic Panel: Recent Labs  Lab 05/22/21 1142  NA 136  K 3.7  CL 97*  CO2 26  GLUCOSE 158*  BUN 8  CREATININE 0.78  CALCIUM 9.3  MG 2.3   Liver Function Tests: Recent Labs  Lab 05/22/21 1142  AST 169*  ALT 94*  ALKPHOS 81  BILITOT 0.9  PROT 7.9  ALBUMIN 4.5   No results for input(s): LIPASE, AMYLASE in the last 168 hours. No results for input(s): AMMONIA in the last 168 hours. CBC: Recent Labs  Lab 05/22/21 1142  WBC 7.6  NEUTROABS 5.6  HGB 13.9  HCT 40.0  MCV 92.4  PLT 204   Cardiac Enzymes: No results for input(s): CKTOTAL, CKMB, CKMBINDEX, TROPONINI in the last 168 hours.  BNP (last 3 results) No results for  input(s): PROBNP in the last 8760 hours. CBG: No results for input(s): GLUCAP in the last 168 hours.  Radiological Exams on Admission: CT Head Wo Contrast  Result Date: 05/22/2021 CLINICAL DATA:  Acute neuro deficit.  Dizziness and weakness. EXAM: CT HEAD WITHOUT CONTRAST TECHNIQUE: Contiguous axial images were obtained from the base of the skull through the vertex without intravenous contrast. COMPARISON:  None. FINDINGS: Brain: No evidence of acute infarction, hemorrhage, hydrocephalus, extra-axial collection or mass lesion/mass effect. Vascular: Negative for hyperdense vessel Skull: Negative Sinuses/Orbits: Mild mucosal edema paranasal sinuses with prior surgery. Negative for orbital lesion Other: None IMPRESSION: Negative CT head Electronically Signed   By: Marlan Palau M.D.   On: 05/22/2021 12:59    EKG: Independently reviewed.  None  Assessment/Plan Dizziness likely due to alcohol intoxication/  Alcohol abuse with intoxication (HCC) We will go ahead and admit him under observation start him on high-dose thiamine 500 mg 3 times daily. Will get an MRI he has no nystagmus unlikely to have Warnicke's Korsakoff encephalopathy.  On walking him he is falling to his side like he is inebriated. I believe he is inebriated, will allow him diet give him a banana bag and folate. We will hydrate him check orthostatics monitor strict I's and O's. Monitor electrolytes and replete as needed and will reevaluate in the morning. Monitor for alcohol withdrawal   DVT Prophylaxis: lovewnox Code Status: Full  Family Communication: none  Disposition Plan: observation      Given the aforementioned, the predictability of an adverse outcome is felt to be significant. I expect that the patient will require at least 2 midnights in the hospital to treat this condition.  Marinda Elk MD [facility]   05/22/2021, 3:08 PM

## 2021-05-23 DIAGNOSIS — R270 Ataxia, unspecified: Secondary | ICD-10-CM

## 2021-05-23 LAB — CBC
HCT: 37 % — ABNORMAL LOW (ref 39.0–52.0)
Hemoglobin: 12.3 g/dL — ABNORMAL LOW (ref 13.0–17.0)
MCH: 31.9 pg (ref 26.0–34.0)
MCHC: 33.2 g/dL (ref 30.0–36.0)
MCV: 95.9 fL (ref 80.0–100.0)
Platelets: 161 10*3/uL (ref 150–400)
RBC: 3.86 MIL/uL — ABNORMAL LOW (ref 4.22–5.81)
RDW: 12.3 % (ref 11.5–15.5)
WBC: 8.4 10*3/uL (ref 4.0–10.5)
nRBC: 0 % (ref 0.0–0.2)

## 2021-05-23 LAB — BASIC METABOLIC PANEL
Anion gap: 7 (ref 5–15)
BUN: 8 mg/dL (ref 6–20)
CO2: 27 mmol/L (ref 22–32)
Calcium: 8.8 mg/dL — ABNORMAL LOW (ref 8.9–10.3)
Chloride: 102 mmol/L (ref 98–111)
Creatinine, Ser: 0.78 mg/dL (ref 0.61–1.24)
GFR, Estimated: >60 mL/min (ref >60)
Glucose, Bld: 96 mg/dL (ref 70–99)
Potassium: 4.1 mmol/L (ref 3.5–5.1)
Sodium: 136 mmol/L (ref 135–145)

## 2021-05-23 LAB — MAGNESIUM: Magnesium: 2.1 mg/dL (ref 1.7–2.4)

## 2021-05-23 LAB — PHOSPHORUS: Phosphorus: 2.6 mg/dL (ref 2.5–4.6)

## 2021-05-23 LAB — HIV ANTIBODY (ROUTINE TESTING W REFLEX): HIV Screen 4th Generation wRfx: NONREACTIVE

## 2021-05-23 MED ORDER — NICOTINE 14 MG/24HR TD PT24
14.0000 mg | MEDICATED_PATCH | Freq: Every day | TRANSDERMAL | Status: DC
  Administered 2021-05-23 – 2021-05-24 (×2): 14 mg via TRANSDERMAL
  Filled 2021-05-23 (×2): qty 1

## 2021-05-23 MED ORDER — METRONIDAZOLE 500 MG PO TABS
500.0000 mg | ORAL_TABLET | Freq: Two times a day (BID) | ORAL | Status: DC
  Administered 2021-05-23 – 2021-05-24 (×3): 500 mg via ORAL
  Filled 2021-05-23 (×3): qty 1

## 2021-05-23 MED ORDER — BOOST / RESOURCE BREEZE PO LIQD CUSTOM
1.0000 | Freq: Two times a day (BID) | ORAL | Status: DC
  Administered 2021-05-23: 1 via ORAL

## 2021-05-23 MED ORDER — PROSOURCE PLUS PO LIQD
30.0000 mL | Freq: Two times a day (BID) | ORAL | Status: DC
  Administered 2021-05-23 (×2): 30 mL via ORAL
  Filled 2021-05-23: qty 30

## 2021-05-23 NOTE — Progress Notes (Signed)
Initial Nutrition Assessment  DOCUMENTATION CODES:   Not applicable  INTERVENTION:  - will order Boost Breeze BID, each supplement provides 250 kcal and 9 grams of protein. - will order 30 ml Prosource Plus BID, each supplement provides 100 kcal and 15 grams protein.    NUTRITION DIAGNOSIS:   Inadequate oral intake related to social / environmental circumstances as evidenced by per patient/family report.  GOAL:   Patient will meet greater than or equal to 90% of their needs  MONITOR:   PO intake, Supplement acceptance, Labs, Weight trends  REASON FOR ASSESSMENT:   Malnutrition Screening Tool  ASSESSMENT:   45 y.o. male with medical history of heavy alcohol abuse--he drinks at least three 24 oz cans of beer/day for the past several years. He presented to the ED due to dizziness and imbalance that started on the day of admission (8/11).  Patient is Observation status. He reported that when he is drinking he prioritizes that over eating.   He has not been seen by a [place] RD at any time in the past.   Weight yesterday was documented as 145 lb, which appears to be a stated weight. Weight on 01/30/21 was 149 lb and weight on 07/31/20 was 145 lb. No information documented in the edema section of flow sheet.    Labs reviewed; LFT elevated but trending down from 8/11. Medications reviewed; 1 mg folvite/day, 400 mg Mag-ox x2 doses 8/11, 1 tablet multivitamin with minerals/day. 40 mEq Klor-Con x2 doses 8/11, 500 mg IV thiamine/day 8/11-8/13, 100 mg thiamine/day. IVF; NS @ 100 ml/hr.     NUTRITION - FOCUSED PHYSICAL EXAM:  Unable to complete at this time.  Diet Order:   Diet Order             Diet regular Room service appropriate? Yes; Fluid consistency: Thin  Diet effective now                   EDUCATION NEEDS:   No education needs have been identified at this time  Skin:  Skin Assessment: Reviewed RN Assessment  Last BM:  8/11 (per patient  report)  Height:   Ht Readings from Last 1 Encounters:  05/22/21 6\' 1"  (1.854 m)    Weight:   Wt Readings from Last 1 Encounters:  05/22/21 65.8 kg      Estimated Nutritional Needs:  Kcal:  2100-2300 kcal Protein:  105-125 grams Fluid:  >/= 2.5 L/day      07/22/21, MS, RD, LDN, CNSC Inpatient Clinical Dietitian RD pager # available in [facility]  After hours/weekend pager # available in [facility]

## 2021-05-23 NOTE — Progress Notes (Signed)
[facility] PROGRESS NOTE    Progress Note  Gregory Bishop  IRS:[id number] DOB: 11-04-1975 DOA: 05/22/2021 PCP: Patient, No Pcp Per (Inactive)     Brief Narrative:   [name] is an 45 y.o. male 45 y.o. male no significant past medical history except for heavy drinker at least 3 cans of 24 ounce beer per day for the last several years comes in for dizziness and imbalance that started on the day of admission    Assessment/Plan:   Dizziness likely due to alcohol intoxication/alcohol abuse: He was started on IV high-dose thiamine. And no nystagmus on physical exam. MRI of the brain showed no evidence of infarction hemorrhage or mass. Check orthostatics. Continue CIWA protocol thiamine and folate monitor for signs of withdrawal.  Trichomonas infection: We will go ahead and send in Flagyl 3 times a day for 7 days which is the preferred treatment over single dose.  DVT prophylaxis: lovenox Family Communication:none Status is: Observation  The patient remains OBS appropriate and will d/c before 2 midnights.  Dispo: The patient is from: Home              Anticipated d/c is to: Home              Patient currently is not medically stable to d/c.   Difficult to place patient No     Code Status:     Code Status Orders  (From admission, onward)           Start     Ordered   05/22/21 1543  Full code  Continuous        05/22/21 1543           Code Status History     This patient has a current code status but no historical code status.         IV Access:   Peripheral IV   Procedures and diagnostic studies:   CT Head Wo Contrast  Result Date: 05/22/2021 CLINICAL DATA:  Acute neuro deficit.  Dizziness and weakness. EXAM: CT HEAD WITHOUT CONTRAST TECHNIQUE: Contiguous axial images were obtained from the base of the skull through the vertex without intravenous contrast. COMPARISON:  None. FINDINGS: Brain: No evidence of acute infarction, hemorrhage,  hydrocephalus, extra-axial collection or mass lesion/mass effect. Vascular: Negative for hyperdense vessel Skull: Negative Sinuses/Orbits: Mild mucosal edema paranasal sinuses with prior surgery. Negative for orbital lesion Other: None IMPRESSION: Negative CT head Electronically Signed   By: Marlan Palau M.D.   On: 05/22/2021 12:59   MR Brain Wo Contrast (neuro protocol)  Result Date: 05/22/2021 CLINICAL DATA:  Neuro deficit, acute, stroke suspected EXAM: MRI HEAD WITHOUT CONTRAST TECHNIQUE: Multiplanar, multiecho pulse sequences of the brain and surrounding structures were obtained without intravenous contrast. COMPARISON:  None. FINDINGS: Brain: There is no acute infarction or intracranial hemorrhage. There is no intracranial mass, mass effect, or edema. There is no hydrocephalus or extra-axial fluid collection. Ventricles and sulci are normal in size and configuration. Minimal punctate foci of T2 hyperintensity in the supratentorial white matter likely reflect nonspecific gliosis/demyelination of doubtful clinical significance. Vascular: Major vessel flow voids at the skull base are preserved. Skull and upper cervical spine: Normal marrow signal is preserved. Sinuses/Orbits: Paranasal sinuses are aerated. Deformity of the medial wall the right orbit may reflect sequelae of prior trauma. Orbits otherwise unremarkable. Other: Sella is unremarkable.  Mastoid air cells are clear. IMPRESSION: No evidence of recent infarction, hemorrhage, or mass. Electronically Signed   By: Elaina Hoops  Patel M.D.   On: 05/22/2021 16:27     Medical Consultants:   None.   Subjective:    Gregory Bishop still dizzy.  Objective:    Vitals:   05/22/21 2300 05/23/21 0030 05/23/21 0419 05/23/21 1016  BP: 132/86 129/84 112/79 105/89  Pulse: 78 (!) 58 (!) 50 (!) 49  Resp: 14 16 18 20   Temp:  98.4 F (36.9 C) 98 F (36.7 C) 97.8 F (36.6 C)  TempSrc:  Oral Oral Oral  SpO2: 99% 98% 97% 97%  Weight:      Height:        SpO2: 97 %   Intake/Output Summary (Last 24 hours) at 05/23/2021 1025 Last data filed at 05/23/2021 0600 Gross per 24 hour  Intake 832.28 ml  Output --  Net 832.28 ml   Filed Weights   05/22/21 1024  Weight: 65.8 kg    Exam: General exam: In no acute distress. Respiratory system: Good air movement and clear to auscultation. Cardiovascular system: S1 & S2 heard, RRR. No JVD, murmurs, rubs, gallops or clicks.  Gastrointestinal system: Abdomen is nondistended, soft and nontender.  Central nervous system: Alert and oriented. No focal neurological deficits. Extremities: No pedal edema. Skin: No rashes, lesions or ulcers Psychiatry: Judgement and insight appear normal. Mood & affect appropriate.    Data Reviewed:    Labs: Basic Metabolic Panel: Recent Labs  Lab 05/22/21 1142 05/22/21 2230 05/23/21 0446  NA 136 136 136  K 3.7 3.6 4.1  CL 97* 101 102  CO2 26 27 27   GLUCOSE 158* 143* 96  BUN 8 9 8   CREATININE 0.78 0.80 0.78  CALCIUM 9.3 9.1 8.8*  MG 2.3 2.1  --   PHOS  --  2.6  --    GFR Estimated Creatinine Clearance: 107.4 mL/min (by C-G formula based on SCr of 0.78 mg/dL). Liver Function Tests: Recent Labs  Lab 05/22/21 1142 05/22/21 2230  AST 169* 96*  ALT 94* 68*  ALKPHOS 81 70  BILITOT 0.9 1.1  PROT 7.9 6.7  ALBUMIN 4.5 3.9   No results for input(s): LIPASE, AMYLASE in the last 168 hours. No results for input(s): AMMONIA in the last 168 hours. Coagulation profile No results for input(s): INR, PROTIME in the last 168 hours. COVID-19 Labs  No results for input(s): DDIMER, FERRITIN, LDH, CRP in the last 72 hours.  Lab Results  Component Value Date   SARSCOV2NAA NEGATIVE 05/22/2021   SARSCOV2NAA NEGATIVE 01/13/2020   SARSCOV2NAA NEGATIVE 10/03/2019    CBC: Recent Labs  Lab 05/22/21 1142 05/23/21 0446  WBC 7.6 8.4  NEUTROABS 5.6  --   HGB 13.9 12.3*  HCT 40.0 37.0*  MCV 92.4 95.9  PLT 204 161   Cardiac Enzymes: No results for  input(s): CKTOTAL, CKMB, CKMBINDEX, TROPONINI in the last 168 hours. BNP (last 3 results) No results for input(s): PROBNP in the last 8760 hours. CBG: No results for input(s): GLUCAP in the last 168 hours. D-Dimer: No results for input(s): DDIMER in the last 72 hours. Hgb A1c: No results for input(s): HGBA1C in the last 72 hours. Lipid Profile: No results for input(s): CHOL, HDL, LDLCALC, TRIG, CHOLHDL, LDLDIRECT in the last 72 hours. Thyroid function studies: No results for input(s): TSH, T4TOTAL, T3FREE, THYROIDAB in the last 72 hours.  Invalid input(s): FREET3 Anemia work up: No results for input(s): VITAMINB12, FOLATE, FERRITIN, TIBC, IRON, RETICCTPCT in the last 72 hours. Sepsis Labs: Recent Labs  Lab 05/22/21 1142 05/23/21 0446  WBC 7.6  8.4   Microbiology Recent Results (from the past 240 hour(s))  Resp Panel by RT-PCR (Flu A&B, Covid) Nasopharyngeal Swab     Status: None   Collection Time: 05/22/21 11:45 AM   Specimen: Nasopharyngeal Swab; Nasopharyngeal(NP) swabs in vial transport medium  Result Value Ref Range Status   SARS Coronavirus 2 by RT PCR NEGATIVE NEGATIVE Final    Comment: (NOTE) SARS-CoV-2 target nucleic acids are NOT DETECTED.  The SARS-CoV-2 RNA is generally detectable in upper respiratory specimens during the acute phase of infection. The lowest concentration of SARS-CoV-2 viral copies this assay can detect is 138 copies/mL. A negative result does not preclude SARS-Cov-2 infection and should not be used as the sole basis for treatment or other patient management decisions. A negative result may occur with  improper specimen collection/handling, submission of specimen other than nasopharyngeal swab, presence of viral mutation(s) within the areas targeted by this assay, and inadequate number of viral copies(<138 copies/mL). A negative result must be combined with clinical observations, patient history, and epidemiological information. The expected  result is Negative.  Fact Sheet for Patients:  BloggerCourse.com  Fact Sheet for Healthcare Providers:  SeriousBroker.it  This test is no t yet approved or cleared by the [place] FDA and  has been authorized for detection and/or diagnosis of SARS-CoV-2 by FDA under an Emergency Use Authorization (EUA). This EUA will remain  in effect (meaning this test can be used) for the duration of the COVID-19 declaration under Section 564(b)(1) of the Act, 21 U.S.C.section 360bbb-3(b)(1), unless the authorization is terminated  or revoked sooner.       Influenza A by PCR NEGATIVE NEGATIVE Final   Influenza B by PCR NEGATIVE NEGATIVE Final    Comment: (NOTE) The Xpert Xpress SARS-CoV-2/FLU/RSV plus assay is intended as an aid in the diagnosis of influenza from Nasopharyngeal swab specimens and should not be used as a sole basis for treatment. Nasal washings and aspirates are unacceptable for Xpert Xpress SARS-CoV-2/FLU/RSV testing.  Fact Sheet for Patients: BloggerCourse.com  Fact Sheet for Healthcare Providers: SeriousBroker.it  This test is not yet approved or cleared by the [place] FDA and has been authorized for detection and/or diagnosis of SARS-CoV-2 by FDA under an Emergency Use Authorization (EUA). This EUA will remain in effect (meaning this test can be used) for the duration of the COVID-19 declaration under Section 564(b)(1) of the Act, 21 U.S.C. section 360bbb-3(b)(1), unless the authorization is terminated or revoked.  Performed at [facility], [address] [place], [place] [address]      Medications:    chlordiazePOXIDE  25 mg Oral TID   enoxaparin (LOVENOX) injection  40 mg Subcutaneous Q24H   folic acid  1 mg Oral Daily   LORazepam  0-4 mg Oral Q6H   Followed by   Melene Muller ON 05/24/2021] LORazepam  0-4 mg Oral Q12H   multivitamin  with minerals  1 tablet Oral Daily   [START ON 05/26/2021] thiamine  100 mg Oral Daily   Or   [START ON 05/26/2021] thiamine  100 mg Intravenous Daily   Continuous Infusions:  sodium chloride 75 mL/hr at 05/22/21 1937   thiamine injection 500 mg (05/22/21 2244)      LOS: 0 days   Marinda Elk  [facility]  05/23/2021, 10:25 AM

## 2021-05-24 DIAGNOSIS — I951 Orthostatic hypotension: Principal | ICD-10-CM

## 2021-05-24 LAB — BASIC METABOLIC PANEL
Anion gap: 7 (ref 5–15)
BUN: 7 mg/dL (ref 6–20)
CO2: 25 mmol/L (ref 22–32)
Calcium: 8.9 mg/dL (ref 8.9–10.3)
Chloride: 102 mmol/L (ref 98–111)
Creatinine, Ser: 0.72 mg/dL (ref 0.61–1.24)
GFR, Estimated: >60 mL/min (ref >60)
Glucose, Bld: 112 mg/dL — ABNORMAL HIGH (ref 70–99)
Potassium: 3.5 mmol/L (ref 3.5–5.1)
Sodium: 134 mmol/L — ABNORMAL LOW (ref 135–145)

## 2021-05-24 MED ORDER — METRONIDAZOLE 500 MG PO TABS
2000.0000 mg | ORAL_TABLET | Freq: Once | ORAL | Status: AC
  Administered 2021-05-24: 2000 mg via ORAL
  Filled 2021-05-24: qty 4

## 2021-05-24 MED ORDER — SODIUM CHLORIDE 0.9 % IV BOLUS
1000.0000 mL | Freq: Once | INTRAVENOUS | Status: AC
  Administered 2021-05-24: 1000 mL via INTRAVENOUS

## 2021-05-24 NOTE — Discharge Summary (Signed)
Physician Discharge Summary  Gregory Bishop HYI:[id number] DOB: 03-Dec-1975 DOA: 05/22/2021  PCP: Patient, No Pcp Per (Inactive)  Admit date: 05/22/2021 Discharge date: 05/24/2021   Admitted From: Home Disposition:  Home  Recommendations for Outpatient Follow-up:  Follow up with PCP in 1-2 weeks Please obtain BMP/CBC in one week   Home Health:no Equipment/Devices:none  Discharge Condition:Stable CODE STATUS:Full Diet recommendation: Heart Healthy  Brief/Interim Summary:  45 y.o. male no significant past medical history except for heavy drinker at least 3 cans of 24 ounce beer per day for the last several years comes in for dizziness and imbalance that started on the day of admission   Discharge Diagnoses:  Active Problems:   Dizziness   Alcohol abuse with intoxication (HCC)   Dizzinesses  Dizziness likely due to orthostatic hypotension: Will start on IV thiamine 97% exam. MRI of the brain showed no acute infarct hemorrhage or masses. Orthostatics were checked which were positive. Also started on folate monitor with CIWA protocol no signs of withdrawal. He was fluid resuscitated dizziness upon standing resolved He was discharged in stable condition.  Trichomonas infection: Her partner relates that she was just diagnosed with trichomonas and she has been in a monogamous relationship with him.  He was started on Flagyl he is following up to be compliant as an outpatienthe was given a single 2 g dose of Flagyl.  Discharge Instructions  Discharge Instructions     Diet - low sodium heart healthy   Complete by: As directed    Increase activity slowly   Complete by: As directed       Allergies as of 05/24/2021   No Known Allergies      Medication List     STOP taking these medications    chlordiazePOXIDE 25 MG capsule Commonly known as: LIBRIUM       TAKE these medications    acetaminophen 500 MG tablet Commonly known as: TYLENOL Take 500-1,000 mg by mouth  every 6 (six) hours as needed for mild pain or headache.   albuterol 108 (90 Base) MCG/ACT inhaler Commonly known as: VENTOLIN HFA Inhale 1-2 puffs into the lungs every 4 (four) hours as needed for wheezing or shortness of breath.   ibuprofen 200 MG tablet Commonly known as: ADVIL Take 200-400 mg by mouth every 6 (six) hours as needed for mild pain or headache.   naltrexone 50 MG tablet Commonly known as: DEPADE Take 50 mg by mouth daily.   NICODERM CQ TD Place 1 patch onto the skin daily as needed (for smoking cessation).   NON FORMULARY Take 1-2 tablets by mouth See admin instructions. Emergen-C Citrus-Ginger Gummies, Turmeric and Ginger, Immune Support gummies- Chew 1-2 gummies by mouth daily   ondansetron 4 MG tablet Commonly known as: ZOFRAN Take 1 tablet (4 mg total) by mouth every 8 (eight) hours as needed for nausea or vomiting.        No Known Allergies  Consultations: None   Procedures/Studies: CT Head Wo Contrast  Result Date: 05/22/2021 CLINICAL DATA:  Acute neuro deficit.  Dizziness and weakness. EXAM: CT HEAD WITHOUT CONTRAST TECHNIQUE: Contiguous axial images were obtained from the base of the skull through the vertex without intravenous contrast. COMPARISON:  None. FINDINGS: Brain: No evidence of acute infarction, hemorrhage, hydrocephalus, extra-axial collection or mass lesion/mass effect. Vascular: Negative for hyperdense vessel Skull: Negative Sinuses/Orbits: Mild mucosal edema paranasal sinuses with prior surgery. Negative for orbital lesion Other: None IMPRESSION: Negative CT head Electronically Signed   By: Leonette Most  Chestine Spore M.D.   On: 05/22/2021 12:59   MR Brain Wo Contrast (neuro protocol)  Result Date: 05/22/2021 CLINICAL DATA:  Neuro deficit, acute, stroke suspected EXAM: MRI HEAD WITHOUT CONTRAST TECHNIQUE: Multiplanar, multiecho pulse sequences of the brain and surrounding structures were obtained without intravenous contrast. COMPARISON:  None.  FINDINGS: Brain: There is no acute infarction or intracranial hemorrhage. There is no intracranial mass, mass effect, or edema. There is no hydrocephalus or extra-axial fluid collection. Ventricles and sulci are normal in size and configuration. Minimal punctate foci of T2 hyperintensity in the supratentorial white matter likely reflect nonspecific gliosis/demyelination of doubtful clinical significance. Vascular: Major vessel flow voids at the skull base are preserved. Skull and upper cervical spine: Normal marrow signal is preserved. Sinuses/Orbits: Paranasal sinuses are aerated. Deformity of the medial wall the right orbit may reflect sequelae of prior trauma. Orbits otherwise unremarkable. Other: Sella is unremarkable.  Mastoid air cells are clear. IMPRESSION: No evidence of recent infarction, hemorrhage, or mass. Electronically Signed   By: Guadlupe Spanish M.D.   On: 05/22/2021 16:27   (Echo, Carotid, EGD, Colonoscopy, ERCP)    Subjective:  No complaints Discharge Exam: Vitals:   05/24/21 0330 05/24/21 0500  BP: 106/76 106/66  Pulse: 76 76  Resp: 16 16  Temp: 97.8 F (36.6 C) 97.8 F (36.6 C)  SpO2:  100%   Vitals:   05/23/21 2100 05/23/21 2140 05/24/21 0330 05/24/21 0500  BP:  132/83 106/76 106/66  Pulse: 70 80 76 76  Resp:  18 16 16   Temp:  98.7 F (37.1 C) 97.8 F (36.6 C) 97.8 F (36.6 C)  TempSrc:  Oral Oral Oral  SpO2:  99%  100%  Weight:      Height:        General: Pt is alert, awake, not in acute distress Cardiovascular: RRR, S1/S2 +, no rubs, no gallops Respiratory: CTA bilaterally, no wheezing, no rhonchi Abdominal: Soft, NT, ND, bowel sounds + Extremities: no edema, no cyanosis    The results of significant diagnostics from this hospitalization (including imaging, microbiology, ancillary and laboratory) are listed below for reference.     Microbiology: Recent Results (from the past 240 hour(s))  Resp Panel by RT-PCR (Flu A&B, Covid) Nasopharyngeal Swab      Status: None   Collection Time: 05/22/21 11:45 AM   Specimen: Nasopharyngeal Swab; Nasopharyngeal(NP) swabs in vial transport medium  Result Value Ref Range Status   SARS Coronavirus 2 by RT PCR NEGATIVE NEGATIVE Final    Comment: (NOTE) SARS-CoV-2 target nucleic acids are NOT DETECTED.  The SARS-CoV-2 RNA is generally detectable in upper respiratory specimens during the acute phase of infection. The lowest concentration of SARS-CoV-2 viral copies this assay can detect is 138 copies/mL. A negative result does not preclude SARS-Cov-2 infection and should not be used as the sole basis for treatment or other patient management decisions. A negative result may occur with  improper specimen collection/handling, submission of specimen other than nasopharyngeal swab, presence of viral mutation(s) within the areas targeted by this assay, and inadequate number of viral copies(<138 copies/mL). A negative result must be combined with clinical observations, patient history, and epidemiological information. The expected result is Negative.  Fact Sheet for Patients:  07/22/21  Fact Sheet for Healthcare Providers:  BloggerCourse.com  This test is no t yet approved or cleared by the [place] FDA and  has been authorized for detection and/or diagnosis of SARS-CoV-2 by FDA under an Emergency Use Authorization (EUA). This EUA will  remain  in effect (meaning this test can be used) for the duration of the COVID-19 declaration under Section 564(b)(1) of the Act, 21 U.S.C.section 360bbb-3(b)(1), unless the authorization is terminated  or revoked sooner.       Influenza A by PCR NEGATIVE NEGATIVE Final   Influenza B by PCR NEGATIVE NEGATIVE Final    Comment: (NOTE) The Xpert Xpress SARS-CoV-2/FLU/RSV plus assay is intended as an aid in the diagnosis of influenza from Nasopharyngeal swab specimens and should not be used as a sole basis  for treatment. Nasal washings and aspirates are unacceptable for Xpert Xpress SARS-CoV-2/FLU/RSV testing.  Fact Sheet for Patients: BloggerCourse.comhttps://www.fda.gov/media/152166/download  Fact Sheet for Healthcare Providers: SeriousBroker.ithttps://www.fda.gov/media/152162/download  This test is not yet approved or cleared by the [place] FDA and has been authorized for detection and/or diagnosis of SARS-CoV-2 by FDA under an Emergency Use Authorization (EUA). This EUA will remain in effect (meaning this test can be used) for the duration of the COVID-19 declaration under Section 564(b)(1) of the Act, 21 U.S.C. section 360bbb-3(b)(1), unless the authorization is terminated or revoked.  Performed at [facility], [address] [place], [place] [id number]      Labs: BNP (last 3 results) No results for input(s): BNP in the last 8760 hours. Basic Metabolic Panel: Recent Labs  Lab 05/22/21 1142 05/22/21 2230 05/23/21 0446 05/24/21 0514  NA 136 136 136 134*  K 3.7 3.6 4.1 3.5  CL 97* 101 102 102  CO2 26 27 27 25   GLUCOSE 158* 143* 96 112*  BUN 8 9 8 7   CREATININE 0.78 0.80 0.78 0.72  CALCIUM 9.3 9.1 8.8* 8.9  MG 2.3 2.1  --   --   PHOS  --  2.6  --   --    Liver Function Tests: Recent Labs  Lab 05/22/21 1142 05/22/21 2230  AST 169* 96*  ALT 94* 68*  ALKPHOS 81 70  BILITOT 0.9 1.1  PROT 7.9 6.7  ALBUMIN 4.5 3.9   No results for input(s): LIPASE, AMYLASE in the last 168 hours. No results for input(s): AMMONIA in the last 168 hours. CBC: Recent Labs  Lab 05/22/21 1142 05/23/21 0446  WBC 7.6 8.4  NEUTROABS 5.6  --   HGB 13.9 12.3*  HCT 40.0 37.0*  MCV 92.4 95.9  PLT 204 161   Cardiac Enzymes: No results for input(s): CKTOTAL, CKMB, CKMBINDEX, TROPONINI in the last 168 hours. BNP: Invalid input(s): POCBNP CBG: No results for input(s): GLUCAP in the last 168 hours. D-Dimer No results for input(s): DDIMER in the last 72 hours. Hgb A1c No results for  input(s): HGBA1C in the last 72 hours. Lipid Profile No results for input(s): CHOL, HDL, LDLCALC, TRIG, CHOLHDL, LDLDIRECT in the last 72 hours. Thyroid function studies No results for input(s): TSH, T4TOTAL, T3FREE, THYROIDAB in the last 72 hours.  Invalid input(s): FREET3 Anemia work up No results for input(s): VITAMINB12, FOLATE, FERRITIN, TIBC, IRON, RETICCTPCT in the last 72 hours. Urinalysis    Component Value Date/Time   COLORURINE YELLOW 10/02/2019 2229   APPEARANCEUR CLEAR 10/02/2019 2229   LABSPEC 1.012 10/02/2019 2229   PHURINE 5.0 10/02/2019 2229   GLUCOSEU NEGATIVE 10/02/2019 2229   HGBUR NEGATIVE 10/02/2019 2229   BILIRUBINUR NEGATIVE 10/02/2019 2229   KETONESUR NEGATIVE 10/02/2019 2229   PROTEINUR NEGATIVE 10/02/2019 2229   NITRITE NEGATIVE 10/02/2019 2229   LEUKOCYTESUR NEGATIVE 10/02/2019 2229   Sepsis Labs Invalid input(s): PROCALCITONIN,  WBC,  LACTICIDVEN Microbiology Recent Results (from the  past 240 hour(s))  Resp Panel by RT-PCR (Flu A&B, Covid) Nasopharyngeal Swab     Status: None   Collection Time: 05/22/21 11:45 AM   Specimen: Nasopharyngeal Swab; Nasopharyngeal(NP) swabs in vial transport medium  Result Value Ref Range Status   SARS Coronavirus 2 by RT PCR NEGATIVE NEGATIVE Final    Comment: (NOTE) SARS-CoV-2 target nucleic acids are NOT DETECTED.  The SARS-CoV-2 RNA is generally detectable in upper respiratory specimens during the acute phase of infection. The lowest concentration of SARS-CoV-2 viral copies this assay can detect is 138 copies/mL. A negative result does not preclude SARS-Cov-2 infection and should not be used as the sole basis for treatment or other patient management decisions. A negative result may occur with  improper specimen collection/handling, submission of specimen other than nasopharyngeal swab, presence of viral mutation(s) within the areas targeted by this assay, and inadequate number of viral copies(<138 copies/mL).  A negative result must be combined with clinical observations, patient history, and epidemiological information. The expected result is Negative.  Fact Sheet for Patients:  BloggerCourse.com  Fact Sheet for Healthcare Providers:  SeriousBroker.it  This test is no t yet approved or cleared by the [place] FDA and  has been authorized for detection and/or diagnosis of SARS-CoV-2 by FDA under an Emergency Use Authorization (EUA). This EUA will remain  in effect (meaning this test can be used) for the duration of the COVID-19 declaration under Section 564(b)(1) of the Act, 21 U.S.C.section 360bbb-3(b)(1), unless the authorization is terminated  or revoked sooner.       Influenza A by PCR NEGATIVE NEGATIVE Final   Influenza B by PCR NEGATIVE NEGATIVE Final    Comment: (NOTE) The Xpert Xpress SARS-CoV-2/FLU/RSV plus assay is intended as an aid in the diagnosis of influenza from Nasopharyngeal swab specimens and should not be used as a sole basis for treatment. Nasal washings and aspirates are unacceptable for Xpert Xpress SARS-CoV-2/FLU/RSV testing.  Fact Sheet for Patients: BloggerCourse.com  Fact Sheet for Healthcare Providers: SeriousBroker.it  This test is not yet approved or cleared by the [place] FDA and has been authorized for detection and/or diagnosis of SARS-CoV-2 by FDA under an Emergency Use Authorization (EUA). This EUA will remain in effect (meaning this test can be used) for the duration of the COVID-19 declaration under Section 564(b)(1) of the Act, 21 U.S.C. section 360bbb-3(b)(1), unless the authorization is terminated or revoked.  Performed at [facility], [address] [place], [place] [address]       SIGNED:   Marinda Elk, MD  [facility] 05/24/2021, 9:54 AM Pager   If 7PM-7AM, please contact  night-coverage [email or web address] Password [email or web address]

## 2021-05-24 NOTE — Plan of Care (Signed)
Instructions were reviewed with patient. All questions were answered. Patient was transported to main entrance by wheelchair. ° °

## 2021-07-07 ENCOUNTER — Other Ambulatory Visit: Payer: Self-pay

## 2021-07-07 ENCOUNTER — Ambulatory Visit (HOSPITAL_COMMUNITY)
Admission: EM | Admit: 2021-07-07 | Discharge: 2021-07-07 | Disposition: A | Discharge status: Home / Self Care | Payer: No Payment, Other | Attending: Psychiatry | Admitting: Psychiatry

## 2021-07-07 DIAGNOSIS — Z760 Encounter for issue of repeat prescription: Secondary | ICD-10-CM | POA: Insufficient documentation

## 2021-07-07 DIAGNOSIS — F39 Unspecified mood [affective] disorder: Secondary | ICD-10-CM | POA: Insufficient documentation

## 2021-07-07 MED ORDER — ARIPIPRAZOLE 5 MG PO TABS: 5.0000 mg | ORAL_TABLET | Freq: Every day | ORAL | 0 refills | Status: DC

## 2021-07-07 MED ORDER — LAMOTRIGINE 25 MG PO TABS: 50.0000 mg | ORAL_TABLET | Freq: Every day | ORAL | 0 refills | Status: DC

## 2021-07-07 NOTE — Discharge Instructions (Addendum)

## 2021-07-07 NOTE — ED Provider Notes (Signed)
Behavioral Health Urgent Care Medical Screening Exam  Patient Name: Gregory Bishop MRN: [id number] Date of Evaluation: 07/07/21 Chief Complaint:   Diagnosis:  Final diagnoses:  Mood disorder (HCC)    History of Present illness: Gregory Bishop is a 45 y.o. male patient presented to [facility] as a walk in  accompanied by his partner with complaints of " I need my medications refilled".  Gregory Bishop, 45 y.o., male patient seen face to face by this provider, consulted with Dr. Bronwen Betters; and chart reviewed on 07/07/21.  On evaluation Gregory Bishop reports in 2020 he was followed by [facility] for mood disorder with psychosis, PTSD, and substance abuse.  States during that time he was prescribed Lamictal 50 mg p.o. daily and Abilify 5 mg p.o. daily.  States during that time he was not compliant with his medications and he did not take them.  He has dealt with alcohol abuse over the past few years.  His last admission was at [facility] for alcohol detox and ataxia.  States he did have DTs during that time.  He was discharged and states he has been free of substances since that time.  Reports when he was discharged he was told to resume his psychiatric medications.  Patient started taking his Lamictal and Abilify that he had at home from 2020.  Patient has been consistent and compliant with medications since his discharge from [facility] in August.  States he is feeling good and stable and he does not want to relapse.  Patient is requesting medication refill at this time.  States he currently has an appointment with [facility] behavioral health outpatient services on November 9.  Discussed open access walk-in hours for medication management, discussed case with Dr. Bronwen Betters.  Patient will be provided with a 7-day prescription for Lamictal and Abilify.  There will be no medication refills.  Patient agrees he will do the open access walk-in hours this week.  This Clinical research associate observed the empty bottles of medications  and paperwork from [facility] with diagnosis.  Patient states he could not go back to [facility] for walk-in hours because they are not accepting new patients.  During evaluation Gregory Bishop is sitting position no acute distress.  He is fairly groomed.  Makes good eye contact.  Has a pleasant affect.  He is alert/oriented x 4; calm/cooperative; and has a pleasant affect.Marland Kitchen  He is speaking in a clear tone at moderate volume, and normal pace.  He is euthymic.  Denies any concerns with appetite or sleep.  His thought process is coherent and relevant; There is no indication that he is currently responding to internal/external stimuli or experiencing delusional thought content; and he has denied suicidal/self-harm/homicidal ideation, psychosis, and paranoia.  Patient has remained calm throughout assessment and has answered questions appropriately.    At this time Gregory Bishop is educated and verbalizes understanding of mental health resources and other crisis services in the community.  He is instructed to call 911 and present to the nearest emergency room should he experience any suicidal/homicidal ideation, auditory/visual/hallucinations, or detrimental worsening of he mental health condition.  Psychiatric Specialty Exam  Presentation  General Appearance:Appropriate for Environment; Casual  Eye Contact:Good  Speech:Clear and Coherent; Normal Rate  Speech Volume:Normal  Handedness:Right   Mood and Affect  Mood:Euthymic  Affect:Congruent   Thought Process  Thought Processes:Coherent  Descriptions of Associations:Intact  Orientation:Full (Time, Place and Person)  Thought Content:Logical; WDL    Hallucinations:None  Ideas of Reference:None  Suicidal Thoughts:No  Homicidal  Thoughts:No   Sensorium  Memory:Immediate Good; Recent Good; Remote Good  Judgment:Good  Insight:Good   Executive Functions  Concentration:Good  Attention Span:Good  Recall:Good  Fund of  Knowledge:Good  Language:Good   Psychomotor Activity  Psychomotor Activity:Normal   Assets  Assets:Communication Skills; Desire for Improvement; Financial Resources/Insurance; Housing; Intimacy; Physical Health; Social Support; Resilience; Transportation; Vocational/Educational   Sleep  Sleep:Good  Number of hours:  No data recorded  No data recorded  Physical Exam: Physical Exam Vitals and nursing note reviewed.  Constitutional:      Appearance: Normal appearance.  HENT:     Head: Normocephalic.     Right Ear: External ear normal.     Left Ear: External ear normal.  Eyes:     General:        Right eye: No discharge.        Left eye: No discharge.     Conjunctiva/sclera: Conjunctivae normal.  Cardiovascular:     Rate and Rhythm: Normal rate.  Pulmonary:     Effort: Pulmonary effort is normal. No respiratory distress.  Musculoskeletal:        General: Normal range of motion.     Cervical back: Normal range of motion.  Skin:    Coloration: Skin is not jaundiced or pale.  Neurological:     Mental Status: He is alert and oriented to person, place, and time.  Psychiatric:        Attention and Perception: Attention and perception normal.        Mood and Affect: Mood and affect normal.        Speech: Speech normal.        Behavior: Behavior normal. Behavior is cooperative.        Thought Content: Thought content normal.        Cognition and Memory: Cognition normal.        Judgment: Judgment normal.   Review of Systems  Constitutional: Negative.   HENT: Negative.    Eyes: Negative.   Respiratory: Negative.    Cardiovascular: Negative.   Musculoskeletal: Negative.   Skin: Negative.   Neurological: Negative.   Psychiatric/Behavioral: Negative.    Blood pressure 99/72, pulse 78, temperature 98.2 F (36.8 C), temperature source Oral, resp. rate 18, SpO2 100 %. There is no height or weight on file to calculate BMI.  Musculoskeletal: Strength & Muscle Tone:  within normal limits Gait & Station: normal Patient leans: N/A   [facility] M[facility] Discharge Disposition for Follow up and Recommendations: Based on my evaluation the patient does not appear to have an emergency medical condition and can be discharged with resources and follow up care in outpatient services for Medication Management  Discharge patient  Provided outpatient psychiatric resources for [facility] behavioral health outpatient services on second floor including open access walk-in hours.   Provided education on Lamictal including adverse effects such as a rash and Abilify.  Provided 7-day prescription for Lamictal 50 mg p.o. daily and Abilify 5 mg p.o. daily.  No evidence of imminent risk to self or others at present.    Patient does not meet criteria for psychiatric inpatient admission. Discussed crisis plan, support from social network, calling 911, coming to the Emergency Department, and calling Suicide Hotline.   Ardis Hughs, NP 07/07/2021, 5:21 PM

## 2021-07-07 NOTE — ED Notes (Signed)
Patient discharged home with no acute distress. Recieved instructions on AVS for follow up care and safety plan. Verbalised understanding. Safety maintained 

## 2021-07-07 NOTE — Progress Notes (Signed)
   07/07/21 1547  [facility] Triage Screening (Walk-ins at [facility] only)  How Did You Hear About [place]? Self  What Is the Reason for Your Visit/Call Today? 45 yo male presenting voluntarily and accompanied by his girlfriend, Gregory Bishop, who he gave permission to participate. Pt is seeking Pt lives with his GF. Pt reported he was just discharged from [facility] from a medical admission for a possible CVA and complications from alcohol abuse. Pt reported no alcohol use for about a month. Pt denies SI, HI, NSSH, AVH and paranoia. Pt denies any other substance use. Pt is seeking to get refills for his prescribed medications. He has an appointment with V[facility] Mixer who prescribed them but his appointment is in December.  How Long Has This Been Causing You Problems? 1-6 months  Have You Recently Had Any Thoughts About Hurting Yourself? No  Are You Planning to Commit Suicide/Harm Yourself At This time? No  Have you Recently Had Thoughts About Hurting Someone Gregory Bishop? No  Are You Planning To Harm Someone At This Time? No  Are you currently experiencing any auditory, visual or other hallucinations? No  Have You Used Any Alcohol or Drugs in the Past 24 Hours? No  Do you have any current medical co-morbidities that require immediate attention? No  Clinician description of patient physical appearance/behavior: Pt was casually dressed and adequately groomed. Pt was polite,somewhat talkative and cooperative. Pt's speech, movement and thought content were within normal limits. Pt's mood was euthymic with full range of expression. Pt was oriented x 4.  What Do You Feel Would Help You the Most Today? Treatment for Depression or other mood problem;Medication(s)  If access to [facility] was not available, would you have sought care in the Emergency Department? Yes  Determination of Need Routine (7 days)  Options For Referral Medication Management  Gregory Bishop T. Jimmye Norman, MS, [facility], [facility] Triage Specialist [facility]

## 2021-07-21 ENCOUNTER — Other Ambulatory Visit: Payer: Self-pay

## 2021-07-21 ENCOUNTER — Encounter (HOSPITAL_COMMUNITY): Payer: Self-pay | Admitting: Licensed Clinical Social Worker

## 2021-07-21 ENCOUNTER — Ambulatory Visit (INDEPENDENT_AMBULATORY_CARE_PROVIDER_SITE_OTHER): Payer: No Payment, Other | Admitting: Licensed Clinical Social Worker

## 2021-07-21 DIAGNOSIS — F39 Unspecified mood [affective] disorder: Secondary | ICD-10-CM | POA: Insufficient documentation

## 2021-07-21 DIAGNOSIS — F319 Bipolar disorder, unspecified: Secondary | ICD-10-CM | POA: Diagnosis not present

## 2021-07-21 DIAGNOSIS — F331 Major depressive disorder, recurrent, moderate: Secondary | ICD-10-CM | POA: Insufficient documentation

## 2021-07-21 DIAGNOSIS — F4310 Post-traumatic stress disorder, unspecified: Secondary | ICD-10-CM | POA: Insufficient documentation
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
## 2021-07-24 ENCOUNTER — Encounter (HOSPITAL_COMMUNITY): Payer: Self-pay | Admitting: Physician Assistant

## 2021-07-24 ENCOUNTER — Other Ambulatory Visit: Payer: Self-pay

## 2021-07-24 ENCOUNTER — Ambulatory Visit (INDEPENDENT_AMBULATORY_CARE_PROVIDER_SITE_OTHER): Payer: No Payment, Other | Admitting: Physician Assistant

## 2021-07-24 VITALS — BP 113/80 | HR 60 | Ht 73.0 in | Wt 150.0 lb

## 2021-07-24 DIAGNOSIS — F4310 Post-traumatic stress disorder, unspecified: Secondary | ICD-10-CM

## 2021-07-24 DIAGNOSIS — F1021 Alcohol dependence, in remission: Secondary | ICD-10-CM | POA: Diagnosis not present

## 2021-07-24 DIAGNOSIS — F319 Bipolar disorder, unspecified: Secondary | ICD-10-CM | POA: Diagnosis not present

## 2021-07-24 MED ORDER — ARIPIPRAZOLE 5 MG PO TABS
5.0000 mg | ORAL_TABLET | Freq: Every day | ORAL | 1 refills | Status: DC
  Filled 2021-07-24: qty 30, 30d supply, fill #0

## 2021-07-24 MED ORDER — LAMOTRIGINE 25 MG PO TABS
50.0000 mg | ORAL_TABLET | Freq: Every day | ORAL | 1 refills | Status: DC
  Filled 2021-07-24: qty 60, 30d supply, fill #0
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
## 2021-08-08 IMAGING — DX DG CHEST 1V PORT
1 series · 1 of 1 positions shown · non-contrast
Comparison: Chest radiograph dated 05/03/2016.

CLINICAL DATA: 43-year-old male with shortness of breath and cough.

EXAM:
PORTABLE CHEST 1 VIEW

[chest]
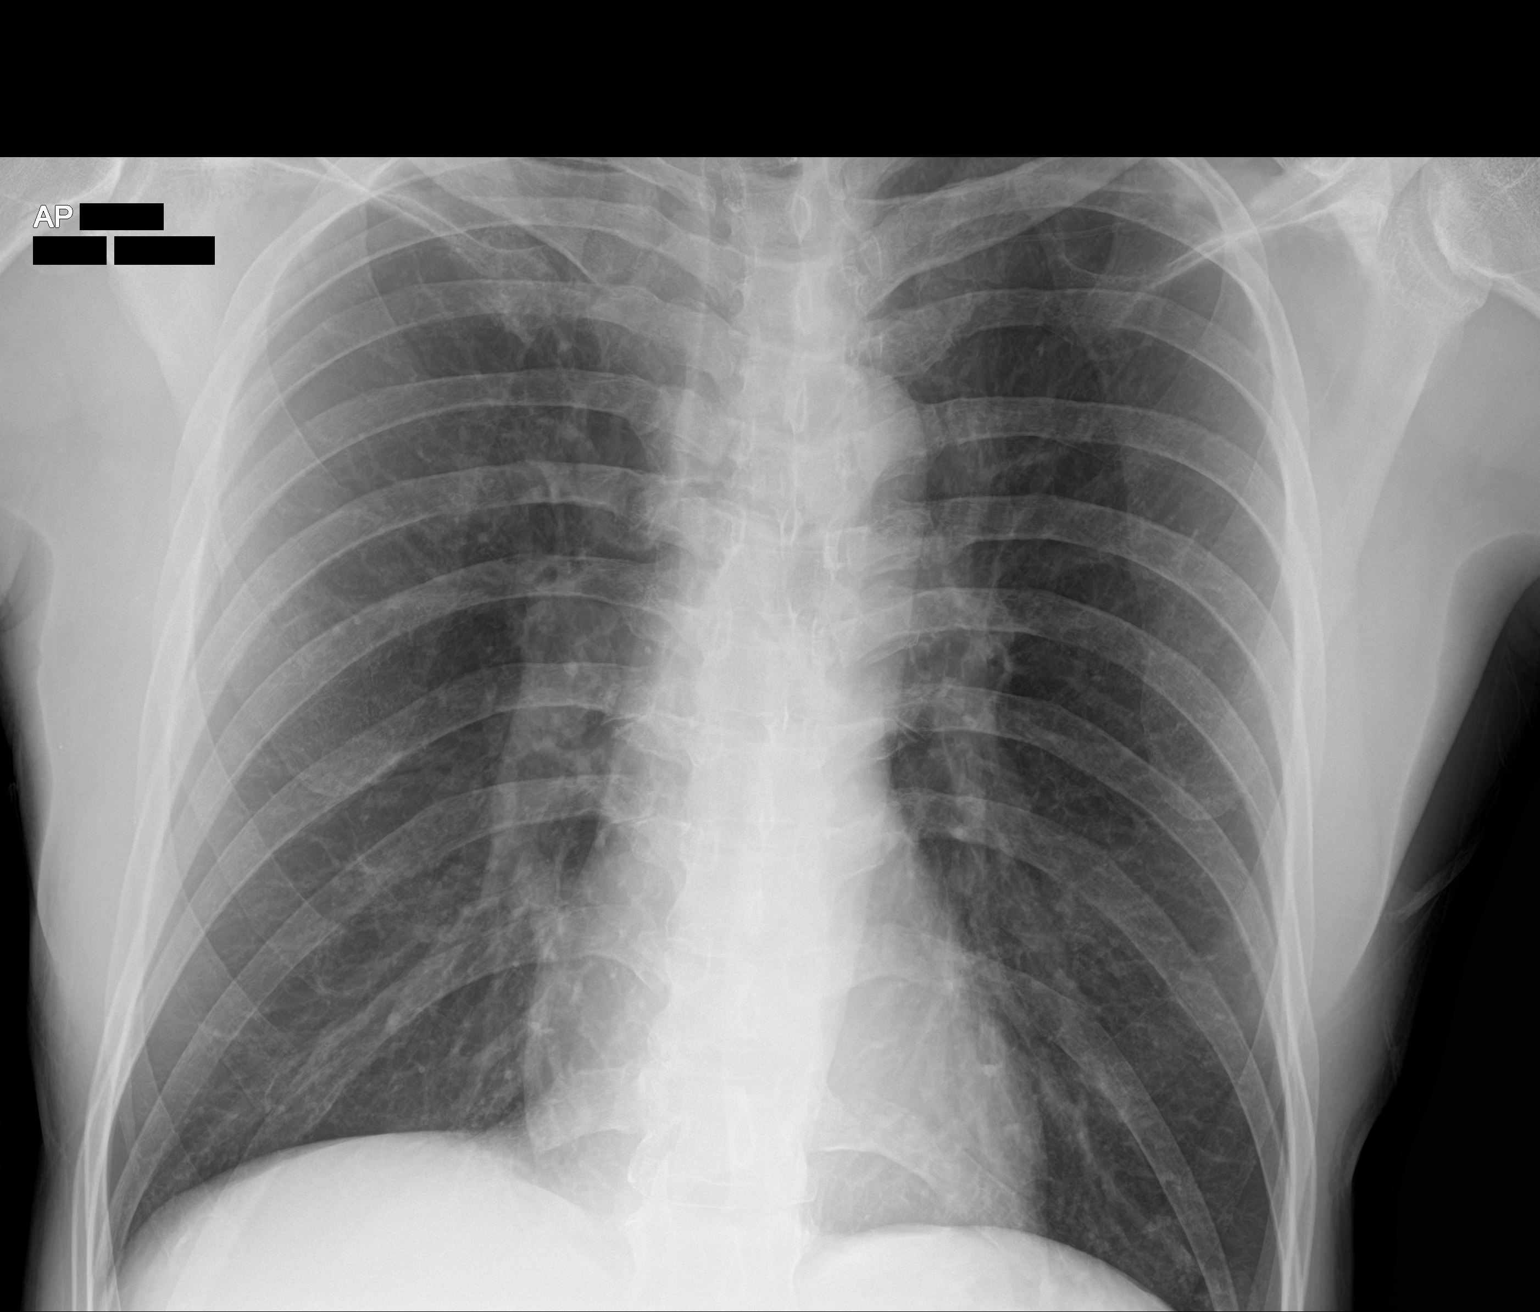

[1 of 1 positions shown; findings below may reference images not displayed]

FINDINGS: There is no focal consolidation, pleural effusion, pneumothorax. The
left costophrenic angle has been excluded from the image. The
cardiac silhouette is within normal limits. No acute osseous
pathology.
IMPRESSION: No active disease.

## 2021-08-20 ENCOUNTER — Ambulatory Visit (HOSPITAL_COMMUNITY): Payer: No Payment, Other | Admitting: Licensed Clinical Social Worker

## 2021-09-18 ENCOUNTER — Encounter (HOSPITAL_COMMUNITY): Payer: Self-pay

## 2021-09-18 ENCOUNTER — Ambulatory Visit (HOSPITAL_COMMUNITY)
Admission: EM | Admit: 2021-09-18 | Discharge: 2021-09-18 | Disposition: A | Discharge status: Home / Self Care | Payer: Medicaid Other | Attending: Family Medicine | Admitting: Family Medicine

## 2021-09-18 ENCOUNTER — Other Ambulatory Visit: Payer: Self-pay

## 2021-09-18 DIAGNOSIS — K047 Periapical abscess without sinus: Secondary | ICD-10-CM

## 2021-09-18 DIAGNOSIS — K029 Dental caries, unspecified: Secondary | ICD-10-CM

## 2021-09-18 DIAGNOSIS — R220 Localized swelling, mass and lump, head: Secondary | ICD-10-CM

## 2021-09-18 MED ORDER — NAPROXEN 500 MG PO TABS: 500.0000 mg | ORAL_TABLET | Freq: Two times a day (BID) | ORAL | 0 refills | Status: DC

## 2021-09-18 MED ORDER — CLINDAMYCIN HCL 150 MG PO CAPS: 150.0000 mg | ORAL_CAPSULE | Freq: Four times a day (QID) | ORAL | 0 refills | Status: AC

## 2021-09-18 NOTE — ED Provider Notes (Signed)
[facility]    CSN: [id number] Arrival date & time: 09/18/21  1239      History   Chief Complaint Chief Complaint  Patient presents with   Dental Pain    HPI Gregory Bishop is a 45 y.o. male.  patient is here for dental pain and facial swelling on the left x 2 days;   he does have h/o dental infection in the past.  He does not have a dentist.  No fevers/chills.  Taken tylenol without much help;  He took a dose of amoxil from her SO last night.  Eating and drinking okay.     Past Medical History:  Diagnosis Date   Stroke [facility])     Patient Active Problem List   Diagnosis Date Noted   Alcohol use disorder, severe, in early remission (HCC) 07/24/2021   Bipolar 1 disorder, depressed (HCC) 07/21/2021   PTSD (post-traumatic stress disorder) 07/21/2021   Dizziness 05/22/2021   Alcohol abuse with intoxication (HCC) 05/22/2021   Dizzinesses 05/22/2021    Past Surgical History:  Procedure Laterality Date   LEG SURGERY Left 2014       Home Medications    Prior to Admission medications   Medication Sig Start Date End Date Taking? Authorizing Provider  acetaminophen (TYLENOL) 500 MG tablet Take 500-1,000 mg by mouth every 6 (six) hours as needed for mild pain or headache.    [provider]  albuterol (VENTOLIN HFA) 108 (90 Base) MCG/ACT inhaler Inhale 1-2 puffs into the lungs every 4 (four) hours as needed for wheezing or shortness of breath. 01/13/20   Tilden Fossa, MD  ARIPiprazole (ABILIFY) 5 MG tablet Take 1 tablet (5 mg total) by mouth daily. 07/24/21   Nwoko, Tommas Olp, PA  ibuprofen (ADVIL) 200 MG tablet Take 200-400 mg by mouth every 6 (six) hours as needed for mild pain or headache.    [provider]  lamoTRIgine (LAMICTAL) 25 MG tablet Take 2 tablets (50 mg total) by mouth daily. 07/24/21   Nwoko, Tommas Olp, PA  naltrexone (DEPADE) 50 MG tablet Take 50 mg by mouth daily.    [provider]  Nicotine (NICODERM CQ TD) Place 1  patch onto the skin daily as needed (for smoking cessation).    [provider]  NON FORMULARY Take 1-2 tablets by mouth See admin instructions. Emergen-C Citrus-Ginger Gummies, Turmeric and Ginger, Immune Support gummies- Chew 1-2 gummies by mouth daily    [provider]  ondansetron (ZOFRAN) 4 MG tablet Take 1 tablet (4 mg total) by mouth every 8 (eight) hours as needed for nausea or vomiting. 07/31/20   Linwood Dibbles, MD    Family History History reviewed. No pertinent family history.  Social History Social History   Tobacco Use   Smoking status: Every Day    Packs/day: 0.50    Types: Cigarettes   Smokeless tobacco: Never  Substance Use Topics   Alcohol use: Not Currently    Alcohol/week: 6.0 standard drinks    Types: 6 Cans of beer per week    Comment: sober 4 months   Drug use: No     Allergies   Patient has no known allergies.   Review of Systems Review of Systems  Constitutional: Negative.  Negative for appetite change, chills and fever.  HENT:  Positive for dental problem and facial swelling. Negative for trouble swallowing.   Respiratory: Negative.    Cardiovascular: Negative.   Gastrointestinal: Negative.     Physical Exam Triage Vital  Signs ED Triage Vitals  Enc Vitals Group     BP 09/18/21 1447 (!) 116/96     Pulse Rate 09/18/21 1444 69     Resp 09/18/21 1444 16     Temp 09/18/21 1444 98.3 F (36.8 C)     Temp src --      SpO2 09/18/21 1444 100 %     Weight --      Height --      Head Circumference --      Peak Flow --      Pain Score 09/18/21 1446 8     Pain Loc --      Pain Edu? --      Excl. in GC? --    No data found.  Updated Vital Signs BP (!) 116/96   Pulse 69   Temp 98.3 F (36.8 C)   Resp 16   SpO2 100%   Visual Acuity Right Eye Distance:   Left Eye Distance:   Bilateral Distance:    Right Eye Near:   Left Eye Near:    Bilateral Near:     Physical Exam Constitutional:      Appearance: Normal  appearance.  HENT:     Head: Normocephalic and atraumatic.     Comments: Mild left sided facial swelling without warmth or erythema;  slightly tender to palpation at this site at the upper jaw line    Mouth/Throat:     Comments: Poor dentition;  focal tenderness to the upper teeth on the left;  gum is slightly swollen;  full rom of the mouth without limitations Cardiovascular:     Rate and Rhythm: Normal rate and regular rhythm.  Pulmonary:     Effort: Pulmonary effort is normal.  Musculoskeletal:     Cervical back: Normal range of motion and neck supple.  Lymphadenopathy:     Cervical: No cervical adenopathy.  Neurological:     Mental Status: He is alert.     UC Treatments / Results  Labs (all labs ordered are listed, but only abnormal results are displayed) Labs Reviewed - No data to display  EKG   Radiology No results found.  Procedures Procedures (including critical care time)  Medications Ordered in UC Medications - No data to display  Initial Impression / Assessment and Plan / UC Course  I have reviewed the triage vital signs and the nursing notes.  Pertinent labs & imaging results that were available during my care of the patient were reviewed by me and considered in my medical decision making (see chart for details).    Final Clinical Impressions(s) / UC Diagnoses   Final diagnoses:  Dental infection  Left facial swelling  Dental caries   Slight swelling to the left side of the face with tooth infection.  No abscess noted;  No other major concern at this time   Discharge Instructions      You have been diagnosed with a dental infection.  Please take all the antibiotics given until those are complete.  I have also sent out naprosyn for you to take twice/day to help with pain/swelling.   Please take with food, and avoid other nsaids while taking this.  Please make an appointment with a local dentist.     ED Prescriptions     Medication Sig Dispense  Auth. Provider   clindamycin (CLEOCIN) 150 MG capsule Take 1 capsule (150 mg total) by mouth 4 (four) times daily for 7 days. 28 capsule Jannifer Franklin, MD  naproxen (NAPROSYN) 500 MG tablet Take 1 tablet (500 mg total) by mouth 2 (two) times daily. 30 tablet Jannifer Franklin, MD      PDMP not reviewed this encounter.   Jannifer Franklin, MD 09/18/21 [phone]

## 2021-09-18 NOTE — ED Triage Notes (Signed)
Patient presents for dental pain x 2 days.

## 2021-09-18 NOTE — Discharge Instructions (Signed)
You have been diagnosed with a dental infection.  Please take all the antibiotics given until those are complete.  I have also sent out naprosyn for you to take twice/day to help with pain/swelling.   Please take with food, and avoid other nsaids while taking this.  Please make an appointment with a local dentist.

## 2021-09-19 ENCOUNTER — Telehealth (HOSPITAL_COMMUNITY): Payer: No Payment, Other | Admitting: Physician Assistant

## 2021-10-23 ENCOUNTER — Telehealth (HOSPITAL_COMMUNITY): Payer: Commercial Managed Care - HMO | Admitting: Physician Assistant

## 2021-10-23 ENCOUNTER — Encounter (HOSPITAL_COMMUNITY): Payer: Self-pay

## 2021-11-19 IMAGING — CR DG CHEST 2V
2 series · 2 of 2 positions shown · non-contrast
Comparison: 10/02/2019

CLINICAL DATA: Shortness of breath.

EXAM:
CHEST - 2 VIEW

[chest pa]
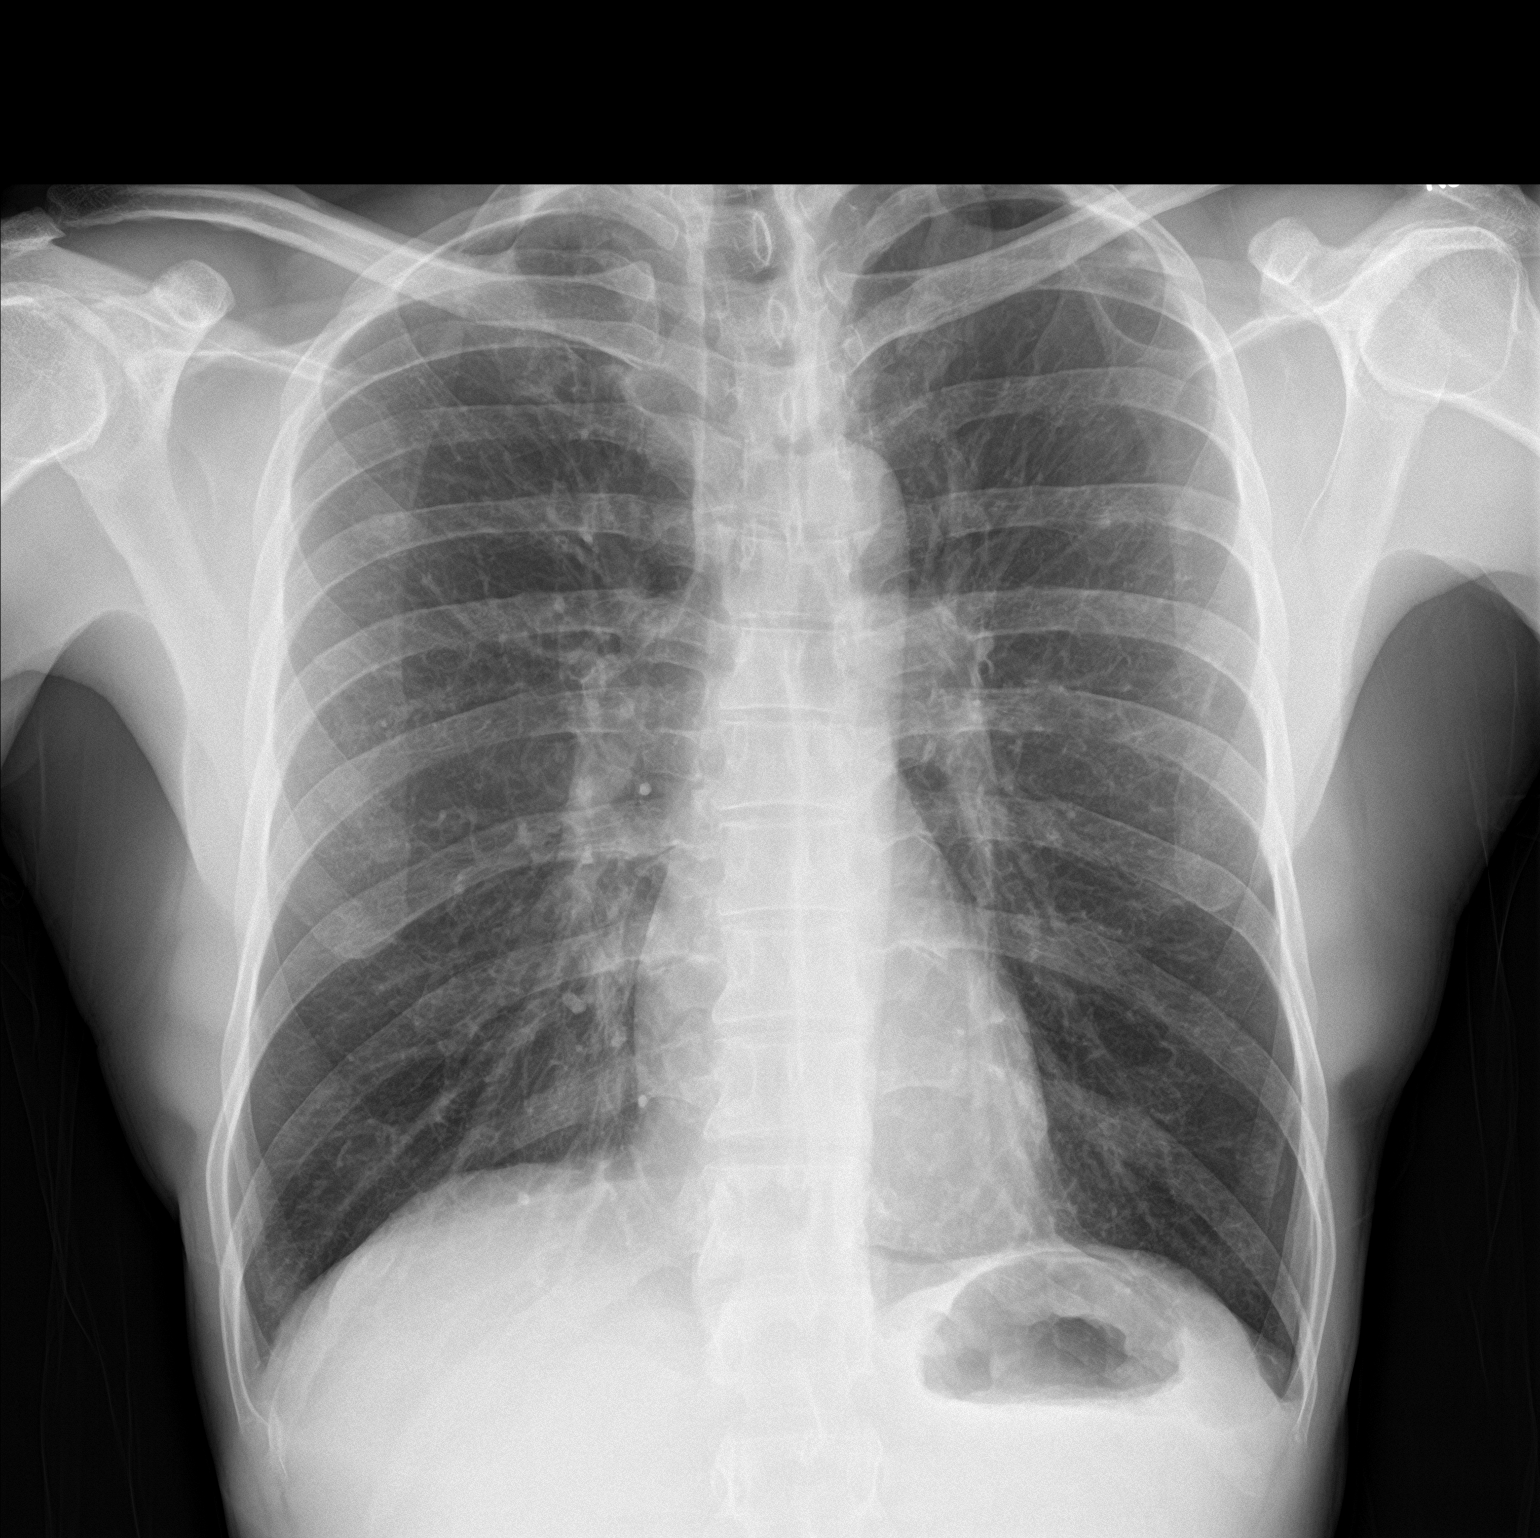

[chest lat]
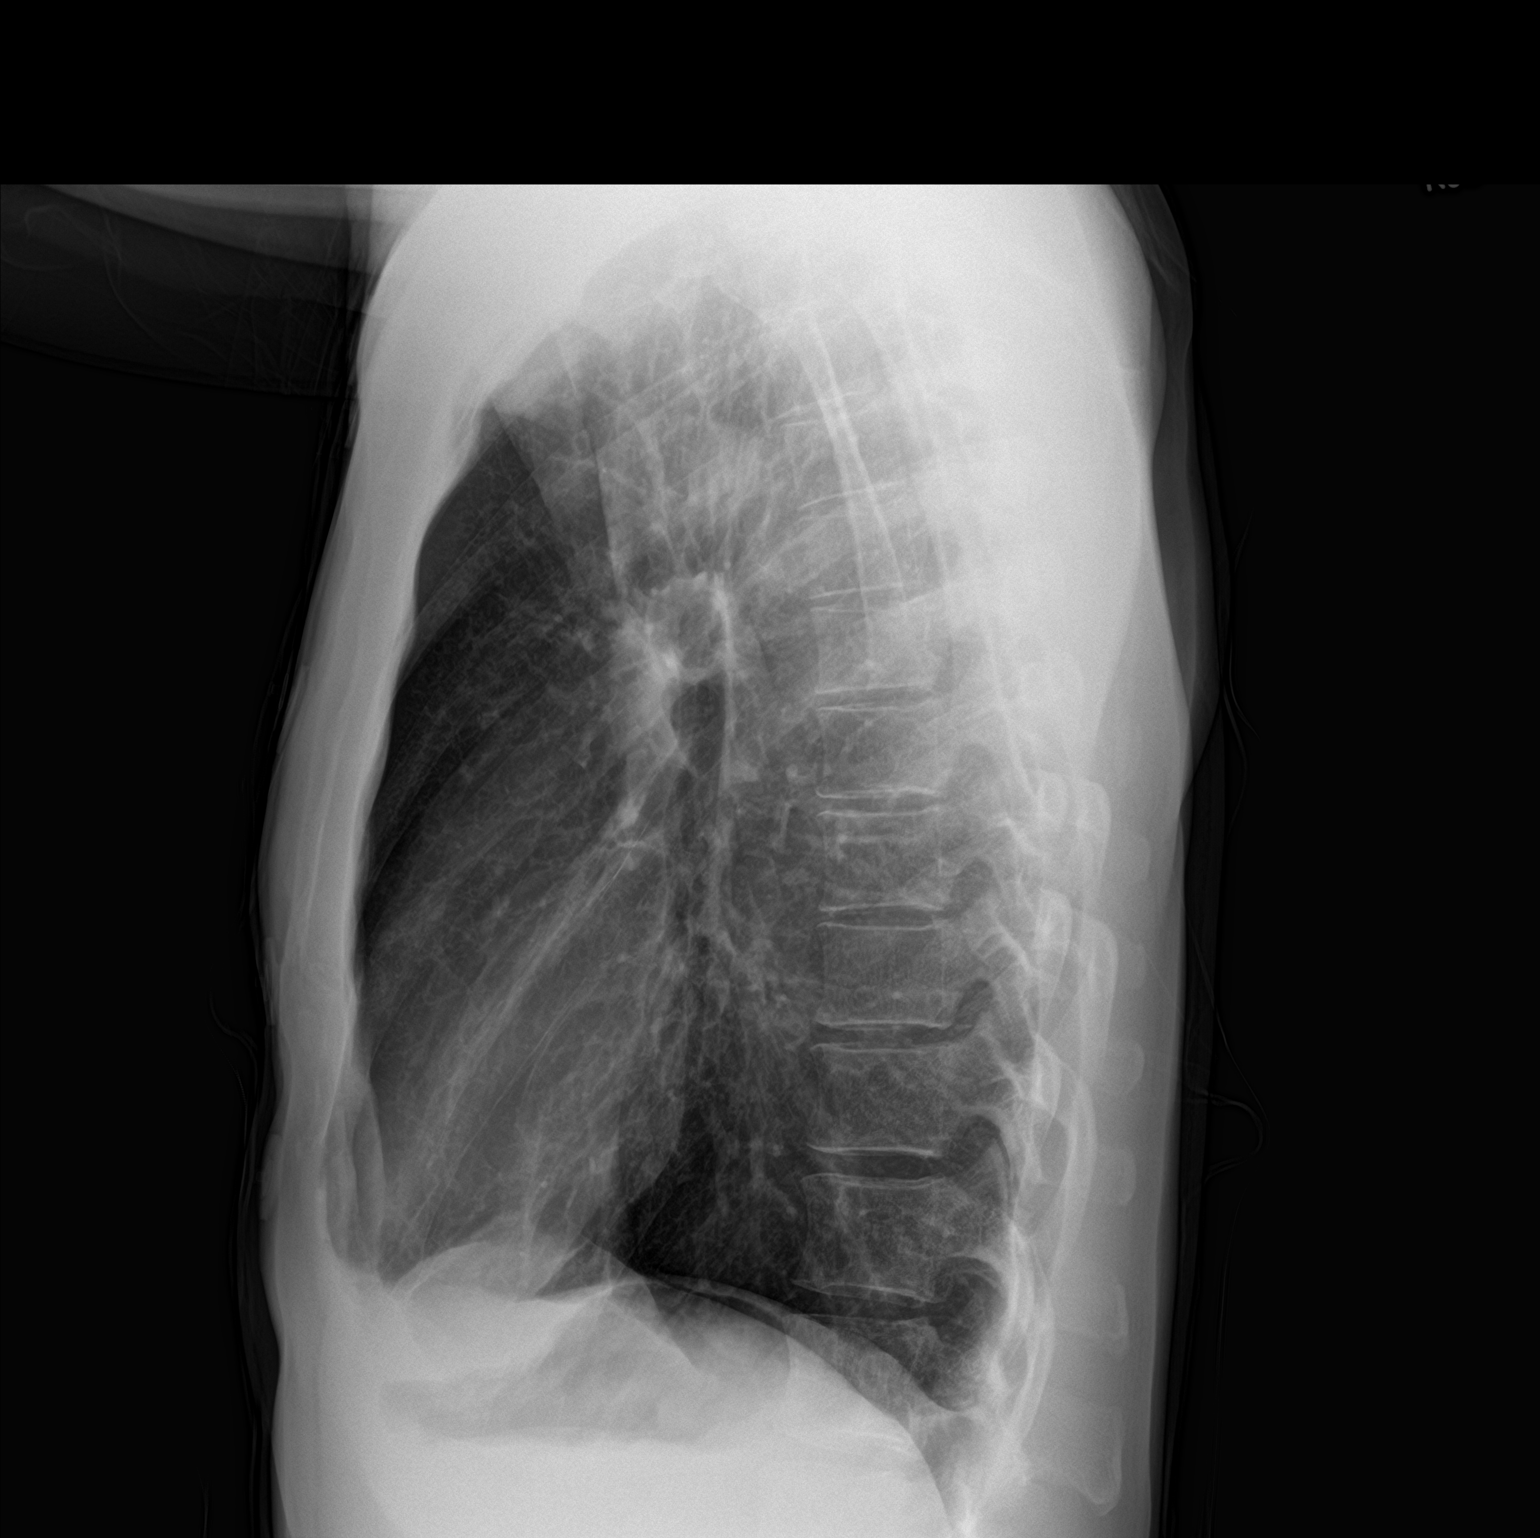

[2 of 2 positions shown; findings below may reference images not displayed]

FINDINGS: Borderline chronic hyperinflation.The cardiomediastinal contours are
normal. The lungs are clear. Pulmonary vasculature is normal. No
consolidation, pleural effusion, or pneumothorax. No acute osseous
abnormalities are seen.
IMPRESSION: No acute chest findings.

## 2021-12-05 ENCOUNTER — Telehealth (INDEPENDENT_AMBULATORY_CARE_PROVIDER_SITE_OTHER): Payer: 59 | Admitting: Physician Assistant

## 2021-12-05 ENCOUNTER — Other Ambulatory Visit: Payer: Self-pay

## 2021-12-05 DIAGNOSIS — G4700 Insomnia, unspecified: Secondary | ICD-10-CM | POA: Diagnosis not present

## 2021-12-05 DIAGNOSIS — F319 Bipolar disorder, unspecified: Secondary | ICD-10-CM | POA: Diagnosis not present

## 2021-12-05 MED ORDER — ARIPIPRAZOLE 5 MG PO TABS
5.0000 mg | ORAL_TABLET | Freq: Every day | ORAL | 2 refills | Status: DC
  Filled 2021-12-05: qty 30, 30d supply, fill #0

## 2021-12-05 MED ORDER — LAMOTRIGINE 25 MG PO TABS
50.0000 mg | ORAL_TABLET | Freq: Every day | ORAL | 2 refills | Status: DC
  Filled 2021-12-05: qty 60, 30d supply, fill #0

## 2021-12-05 MED ORDER — TRAZODONE HCL 50 MG PO TABS
50.0000 mg | ORAL_TABLET | Freq: Every day | ORAL | 2 refills | Status: DC
  Filled 2021-12-05: qty 30, 30d supply, fill #0

## 2021-12-05 NOTE — Progress Notes (Signed)
BH MD/PA/NP OP Progress Note  Virtual Visit via Telephone Note  I connected with Gregory Bishop on 12/06/21 at  3:30 PM EST by telephone and verified that I am speaking with the correct person using two identifiers.  Location: Patient: Home Provider: Clinic   I discussed the limitations, risks, security and privacy concerns of performing an evaluation and management service by telephone and the availability of in person appointments. I also discussed with the patient that there may be a patient responsible charge related to this service. The patient expressed understanding and agreed to proceed.  Follow Up Instructions:   I discussed the assessment and treatment plan with the patient. The patient was provided an opportunity to ask questions and all were answered. The patient agreed with the plan and demonstrated an understanding of the instructions.   The patient was advised to call back or seek an in-person evaluation if the symptoms worsen or if the condition fails to improve as anticipated.  I provided 13 minutes of non-face-to-face time during this encounter.  Meta Hatchet, PA    12/05/2021 3:42 PM Gregory Bishop  MRN:  [id number]  Chief Complaint:  Chief Complaint  Patient presents with   Follow-up   Medication Management   HPI:   Gregory Bishop is a 46 year old male with a past psychiatric history significant for insomniac, and bipolar 1 disorder who presents to [facility] via virtual telephone visit for follow-up and medication management.  Patient is currently being managed on the following medications:  Aripiprazole 5 mg daily Lamotrigine 50 mg daily  Patient reports no issues or concerns regarding his current medication regimen except for some drowsiness.  Patient denies the need for dosage adjustments at this time and is requesting refills on all of his medications following the completion of the encounter.  Patient states that  he will be running out of his medications soon.  Patient denies experiencing any depression and states that he feels real good when taking his medications.  Patient further denies anxiety.  A GAD-7 screen was performed with the patient going to 5.  Patient is alert and oriented x4, calm, cooperative, and fully engaged in conversation during the encounter.  Patient endorses fine mood.  Patient denies suicidal, vital ideations.  He further denies auditory or visual hallucinations and does not appear to be responding to internal/5.  Patient endorses fair sleep and receives on average 4 to 5 hours of sleep each night.  Patient endorses good appetite and eats on average 3 meals per day.  Patient denies alcohol consumption and illicit drug use.  Patient endorses tobacco use and smokes on average 4 cigarettes/day.  Visit Diagnosis:    ICD-10-CM   1. Insomnia, unspecified type  G47.00 traZODone (DESYREL) 50 MG tablet    2. Bipolar 1 disorder, depressed (HCC)  F31.9 ARIPiprazole (ABILIFY) 5 MG tablet    lamoTRIgine (LAMICTAL) 25 MG tablet      Past Psychiatric History:  Bipolar I Disorder  Past Medical History:  Past Medical History:  Diagnosis Date   Stroke [facility])     Past Surgical History:  Procedure Laterality Date   LEG SURGERY Left 2014    Family Psychiatric History:  Patient denies family history of psychiatric illness  Family History: History reviewed. No pertinent family history.  Social History:  Social History   Socioeconomic History   Marital status: Significant Other    Spouse name: Not on file   Number of children: Not on file  Years of education: Not on file   Highest education level: Not on file  Occupational History   Not on file  Tobacco Use   Smoking status: Every Day    Packs/day: 0.50    Types: Cigarettes   Smokeless tobacco: Never  Substance and Sexual Activity   Alcohol use: Not Currently    Alcohol/week: 6.0 standard drinks    Types: 6 Cans of beer per  week    Comment: sober 4 months   Drug use: No   Sexual activity: Yes    Partners: Female    Comment: 1 partner with sig other  Other Topics Concern   Not on file  Social History Narrative   Not on file   Social Determinants of Health   Financial Resource Strain: Low Risk    Difficulty of Paying Living Expenses: Not hard at all  Food Insecurity: No Food Insecurity   Worried About Programme researcher, broadcasting/film/video in the Last Year: Never true   Ran Out of Food in the Last Year: Never true  Transportation Needs: No Transportation Needs   Lack of Transportation (Medical): No   Lack of Transportation (Non-Medical): No  Physical Activity: Sufficiently Active   Days of Exercise per Week: 7 days   Minutes of Exercise per Session: 60 min  Stress: Stress Concern Present   Feeling of Stress : Rather much  Social Connections: Socially Isolated   Frequency of Communication with Friends and Family: Once a week   Frequency of Social Gatherings with Friends and Family: Never   Attends Religious Services: Never   Diplomatic Services operational officer: No   Attends Engineer, structural: Never   Marital Status: Living with partner    Allergies: No Known Allergies  Metabolic Disorder Labs: No results found for: HGBA1C, MPG No results found for: PROLACTIN No results found for: CHOL, TRIG, HDL, CHOLHDL, VLDL, LDLCALC No results found for: TSH  Therapeutic Level Labs: No results found for: LITHIUM No results found for: VALPROATE No components found for:  CBMZ  Current Medications: Current Outpatient Medications  Medication Sig Dispense Refill   traZODone (DESYREL) 50 MG tablet Take 1 tablet (50 mg total) by mouth at bedtime. 30 tablet 2   acetaminophen (TYLENOL) 500 MG tablet Take 500-1,000 mg by mouth every 6 (six) hours as needed for mild pain or headache.     albuterol (VENTOLIN HFA) 108 (90 Base) MCG/ACT inhaler Inhale 1-2 puffs into the lungs every 4 (four) hours as needed for  wheezing or shortness of breath. 8 g 0   ARIPiprazole (ABILIFY) 5 MG tablet Take 1 tablet (5 mg total) by mouth daily. 30 tablet 2   ibuprofen (ADVIL) 200 MG tablet Take 200-400 mg by mouth every 6 (six) hours as needed for mild pain or headache.     lamoTRIgine (LAMICTAL) 25 MG tablet Take 2 tablets (50 mg total) by mouth daily. 60 tablet 2   naltrexone (DEPADE) 50 MG tablet Take 50 mg by mouth daily.     naproxen (NAPROSYN) 500 MG tablet Take 1 tablet (500 mg total) by mouth 2 (two) times daily. 30 tablet 0   Nicotine (NICODERM CQ TD) Place 1 patch onto the skin daily as needed (for smoking cessation).     NON FORMULARY Take 1-2 tablets by mouth See admin instructions. Emergen-C Citrus-Ginger Gummies, Turmeric and Ginger, Immune Support gummies- Chew 1-2 gummies by mouth daily     ondansetron (ZOFRAN) 4 MG tablet Take 1 tablet (4  mg total) by mouth every 8 (eight) hours as needed for nausea or vomiting. 12 tablet 0   No current facility-administered medications for this visit.     Musculoskeletal: Strength & Muscle Tone: Unable to assess due to telemedicine visit Gait & Station: Unable to assess due to telemedicine visit Patient leans: Unable to assess due to telemedicine visit  Psychiatric Specialty Exam: Review of Systems  Psychiatric/Behavioral:  Positive for sleep disturbance. Negative for decreased concentration, dysphoric mood, hallucinations, self-injury and suicidal ideas. The patient is not nervous/anxious and is not hyperactive.    There were no vitals taken for this visit.There is no height or weight on file to calculate BMI.  General Appearance: Unable to assess due to telemedicine visit  Eye Contact:  Unable to assess due to telemedicine visit  Speech:  Clear and Coherent and Normal Rate  Volume:  Normal  Mood:  Euthymic  Affect:  Appropriate  Thought Process:  Coherent, Goal Directed, and Descriptions of Associations: Intact  Orientation:  Full (Time, Place, and Person)   Thought Content: WDL   Suicidal Thoughts:  No  Homicidal Thoughts:  No  Memory:  Immediate;   Good Recent;   Fair Remote;   Fair  Judgement:  Fair  Insight:  Fair  Psychomotor Activity:  Normal  Concentration:  Concentration: Fair and Attention Span: Fair  Recall:  [employer] of Knowledge: Fair  Language: Good  Akathisia:  No  Handed:  Right  AIMS (if indicated): not done  Assets:  Communication Skills Desire for Improvement Housing Intimacy  ADL's:  Intact  Cognition: WNL  Sleep:  Fair   Screenings: CAGE-AID    Flowsheet Row ED to Hosp-Admission (Discharged) from 05/22/2021 in [facility] [place] [facility]  CAGE-AID Score 3      GAD-7    Flowsheet Row Video Visit from 12/05/2021 in [facility] Office Visit from 07/24/2021 in [facility]  Total GAD-7 Score 5 15      PHQ2-9    Flowsheet Row Video Visit from 12/05/2021 in [facility] Office Visit from 07/24/2021 in [facility] Counselor from 07/21/2021 in [facility]  PHQ-2 Total Score 0 3 4  PHQ-9 Total Score -- 11 15      Flowsheet Row Video Visit from 12/05/2021 in [facility] ED from 09/18/2021 in [facility] Visit from 07/24/2021 in [facility]  C-SSRS RISK CATEGORY No Risk No Risk High Risk        Assessment and Plan:   Gregory Bishop is a 46 year old male with a past psychiatric history significant for insomniac, and bipolar 1 disorder who presents to [facility] via virtual telephone visit for follow-up and medication management.  Patient reports that he will be running out of his medications soon and is requesting refills.  Patient denied any major symptoms regarding his mental health but states that he has been experiencing some issues  with his sleep.  Patient was recommended trazodone 50 mg at bedtime for the management of his sleep.  Patient was agreeable to recommendation.  Patient's medications to be e-prescribed to pharmacy of choice.  Collaboration of Care: Collaboration of Care: Medication Management AEB provider managing patient's psychiatric medications, Primary Care Provider AEB patient being seen at [facility], and Psychiatrist AEB patient being followed by this mental health provider  Patient/Guardian was advised Release of Information must be obtained prior to any record release in order to collaborate their care with an outside provider. Patient/Guardian was advised if they have not already done so to contact the registration department to sign all necessary forms in order for us to release information regarding their care.   Consent: Patient/Guardian gives verbal consent for treatment and assignment of benefits for services provided during this visit. Patient/Guardian expressed understanding and agreed to proceed.   1. Bipolar 1 disorder, depressed (HCC)  - ARIPiprazole (ABILIFY) 5 MG tablet; Take 1 tablet (5 mg total) by mouth daily.  Dispense: 30 tablet; Refill: 2 - lamoTRIgine (LAMICTAL) 25 MG tablet; Take 2 tablets (50 mg total) by mouth daily.  Dispense: 60 tablet; Refill: 2  2. Insomnia, unspecified type  - traZODone (DESYREL) 50 MG tablet; Take 1 tablet (50 mg total) by mouth at bedtime.  Dispense: 30 tablet; Refill: 2  Patient to follow up in 2 months Provider spent a total of 13 minutes with the patient/reviewing patient's chart  Meta HatchetUchenna E Vestal Markin, PA 12/05/2021, 3:42 PM

## 2021-12-06 ENCOUNTER — Encounter (HOSPITAL_COMMUNITY): Payer: Self-pay | Admitting: Physician Assistant

## 2021-12-08 ENCOUNTER — Other Ambulatory Visit: Payer: Self-pay

## 2021-12-23 ENCOUNTER — Encounter: Payer: Self-pay | Admitting: Family Medicine

## 2021-12-23 ENCOUNTER — Ambulatory Visit (INDEPENDENT_AMBULATORY_CARE_PROVIDER_SITE_OTHER): Payer: 59 | Admitting: Family Medicine

## 2021-12-23 ENCOUNTER — Other Ambulatory Visit: Payer: Self-pay

## 2021-12-23 VITALS — BP 118/66 | HR 71 | Temp 98.6°F | Ht 72.0 in | Wt 148.2 lb

## 2021-12-23 DIAGNOSIS — F1021 Alcohol dependence, in remission: Secondary | ICD-10-CM

## 2021-12-23 DIAGNOSIS — R062 Wheezing: Secondary | ICD-10-CM

## 2021-12-23 DIAGNOSIS — F17200 Nicotine dependence, unspecified, uncomplicated: Secondary | ICD-10-CM

## 2021-12-23 DIAGNOSIS — Z13220 Encounter for screening for lipoid disorders: Secondary | ICD-10-CM

## 2021-12-23 DIAGNOSIS — G4700 Insomnia, unspecified: Secondary | ICD-10-CM

## 2021-12-23 DIAGNOSIS — Z1159 Encounter for screening for other viral diseases: Secondary | ICD-10-CM

## 2021-12-23 DIAGNOSIS — F319 Bipolar disorder, unspecified: Secondary | ICD-10-CM

## 2021-12-23 DIAGNOSIS — F4310 Post-traumatic stress disorder, unspecified: Secondary | ICD-10-CM | POA: Diagnosis not present

## 2021-12-23 DIAGNOSIS — F17201 Nicotine dependence, unspecified, in remission: Secondary | ICD-10-CM | POA: Insufficient documentation

## 2021-12-23 MED ORDER — ALBUTEROL SULFATE HFA 108 (90 BASE) MCG/ACT IN AERS
1.0000 | INHALATION_SPRAY | RESPIRATORY_TRACT | 0 refills | Status: DC | PRN
Start: 2021-12-23 — End: 2021-12-23
  Filled 2021-12-23: qty 8, fill #0

## 2021-12-23 MED ORDER — ALBUTEROL SULFATE HFA 108 (90 BASE) MCG/ACT IN AERS
1.0000 | INHALATION_SPRAY | RESPIRATORY_TRACT | 0 refills | Status: DC | PRN
  Filled 2021-12-23: qty 8.5, 17d supply, fill #0
  Filled 2022-07-14 – 2022-07-27 (×2): qty 6.7, 25d supply, fill #0
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
## 2021-12-24 LAB — CBC
HCT: 40.4 % (ref 39.0–52.0)
Hemoglobin: 13.6 g/dL (ref 13.0–17.0)
MCHC: 33.6 g/dL (ref 30.0–36.0)
MCV: 93.5 fl (ref 78.0–100.0)
Platelets: 150 10*3/uL (ref 150.0–400.0)
RBC: 4.32 Mil/uL (ref 4.22–5.81)
RDW: 13.4 % (ref 11.5–15.5)
WBC: 10.3 10*3/uL (ref 4.0–10.5)

## 2021-12-24 LAB — COMPREHENSIVE METABOLIC PANEL
ALT: 17 U/L (ref 0–53)
AST: 19 U/L (ref 0–37)
Albumin: 4.3 g/dL (ref 3.5–5.2)
Alkaline Phosphatase: 77 U/L (ref 39–117)
BUN: 9 mg/dL (ref 6–23)
CO2: 29 mEq/L (ref 19–32)
Calcium: 9.4 mg/dL (ref 8.4–10.5)
Chloride: 101 mEq/L (ref 96–112)
Creatinine, Ser: 0.93 mg/dL (ref 0.40–1.50)
GFR: 97.95 mL/min (ref >60.00)
Glucose, Bld: 87 mg/dL (ref 70–99)
Potassium: 4 mEq/L (ref 3.5–5.1)
Sodium: 136 mEq/L (ref 135–145)
Total Bilirubin: 0.4 mg/dL (ref 0.2–1.2)
Total Protein: 6.6 g/dL (ref 6.0–8.3)

## 2021-12-24 LAB — LIPID PANEL
Cholesterol: 184 mg/dL (ref 0–200)
HDL: 52 mg/dL (ref >39.00)
LDL Cholesterol: 113 mg/dL — ABNORMAL HIGH (ref 0–99)
NonHDL: 132.18
Total CHOL/HDL Ratio: 4
Triglycerides: 96 mg/dL (ref 0.0–149.0)
VLDL: 19.2 mg/dL (ref 0.0–40.0)

## 2021-12-24 LAB — FOLATE: Folate: >24.2 ng/mL (ref >5.9)

## 2021-12-24 LAB — VITAMIN B12: Vitamin B-12: 318 pg/mL (ref 211–911)

## 2021-12-24 LAB — HCV INTERPRETATION

## 2021-12-24 LAB — HCV AB W REFLEX TO QUANT PCR: HCV Ab: NONREACTIVE

## 2021-12-30 ENCOUNTER — Other Ambulatory Visit: Payer: Self-pay

## 2022-01-19 ENCOUNTER — Other Ambulatory Visit: Payer: Self-pay

## 2022-01-19 ENCOUNTER — Emergency Department (HOSPITAL_COMMUNITY)
Admission: EM | Admit: 2022-01-19 | Discharge: 2022-01-20 | Disposition: A | Discharge status: Home / Self Care | Payer: 59 | Attending: Emergency Medicine | Admitting: Emergency Medicine

## 2022-01-19 ENCOUNTER — Emergency Department (HOSPITAL_COMMUNITY): Payer: 59

## 2022-01-19 ENCOUNTER — Encounter (HOSPITAL_COMMUNITY): Payer: Self-pay

## 2022-01-19 DIAGNOSIS — R0602 Shortness of breath: Secondary | ICD-10-CM | POA: Diagnosis present

## 2022-01-19 DIAGNOSIS — Z20822 Contact with and (suspected) exposure to covid-19: Secondary | ICD-10-CM | POA: Insufficient documentation

## 2022-01-19 DIAGNOSIS — J4541 Moderate persistent asthma with (acute) exacerbation: Secondary | ICD-10-CM | POA: Diagnosis not present

## 2022-01-19 LAB — RESP PANEL BY RT-PCR (FLU A&B, COVID) ARPGX2
Influenza A by PCR: NEGATIVE
Influenza B by PCR: NEGATIVE
SARS Coronavirus 2 by RT PCR: NEGATIVE

## 2022-01-19 MED ORDER — CETIRIZINE HCL 10 MG PO TABS
10.0000 mg | ORAL_TABLET | Freq: Every day | ORAL | 0 refills | Status: AC
  Filled 2022-01-19 – 2022-04-03 (×2): qty 28, 28d supply, fill #0

## 2022-01-19 MED ORDER — IPRATROPIUM-ALBUTEROL 0.5-2.5 (3) MG/3ML IN SOLN
3.0000 mL | Freq: Once | RESPIRATORY_TRACT | Status: AC
  Administered 2022-01-20: 3 mL via RESPIRATORY_TRACT
  Filled 2022-01-19: qty 3

## 2022-01-19 MED ORDER — ALBUTEROL SULFATE (2.5 MG/3ML) 0.083% IN NEBU
2.5000 mg | INHALATION_SOLUTION | Freq: Once | RESPIRATORY_TRACT | Status: AC
  Administered 2022-01-20: 2.5 mg via RESPIRATORY_TRACT
  Filled 2022-01-19: qty 3

## 2022-01-19 MED ORDER — ALBUTEROL SULFATE HFA 108 (90 BASE) MCG/ACT IN AERS
4.0000 | INHALATION_SPRAY | Freq: Once | RESPIRATORY_TRACT | Status: AC
  Administered 2022-01-19: 4 via RESPIRATORY_TRACT
  Filled 2022-01-19: qty 6.7

## 2022-01-19 MED ORDER — PREDNISONE 10 MG PO TABS
ORAL_TABLET | Freq: Every day | ORAL | 0 refills | Status: DC
  Filled 2022-01-19: qty 42, 12d supply, fill #0

## 2022-01-19 MED ORDER — AEROCHAMBER Z-STAT PLUS/MEDIUM MISC
1.0000 | Freq: Once | Status: AC
  Administered 2022-01-19: 1
  Filled 2022-01-19: qty 1

## 2022-01-19 MED ORDER — PREDNISONE 20 MG PO TABS
60.0000 mg | ORAL_TABLET | Freq: Once | ORAL | Status: AC
Start: 2022-01-19 — End: 2022-01-19
  Administered 2022-01-19: 60 mg via ORAL
  Filled 2022-01-19: qty 3

## 2022-01-19 NOTE — ED Provider Notes (Signed)
?[facility]-EMERGENCY DEPT ?Provider Note ? ? ?CSN: [id number] ?Arrival date & time: 01/19/22  2155 ? ?  ? ?History ? ?Chief Complaint  ?Patient presents with  ? Shortness of Breath  ? Asthma  ? ? ?Gregory Bishop is a 46 y.o. male. ? ? ?Shortness of Breath ?Asthma ?Associated symptoms include shortness of breath.  ? ?Patient is a 46 year old male with past medical history significant for asthma.  He is presented today to the emergency room with symptoms of shortness of breath.  Denies any chest pain or hemoptysis.  ? ?No recent surgeries, hospitalization, long travel, hemoptysis, estrogen containing OCP, cancer history.  No unilateral leg swelling.  No history of PE or VTE.  ? ?He states that he ran out of his albuterol inhaler.  He states that Used 2 doses before coming to the ER today which were his last 2. ? ?  ? ?Home Medications ?Prior to Admission medications   ?Medication Sig Start Date End Date Taking? Authorizing Provider  ?cetirizine (ZYRTEC ALLERGY) 10 MG tablet Take 1 tablet (10 mg total) by mouth daily. 01/19/22  Yes Florencia Zaccaro, Stevphen MeuseWylder S, PA  ?predniSONE (STERAPRED UNI-PAK 21 TAB) 10 MG (21) TBPK tablet Take by mouth daily. Take 6 tabs by mouth daily  for 2 days, then 5 tabs for 2 days, then 4 tabs for 2 days, then 3 tabs for 2 days, 2 tabs for 2 days, then 1 tab by mouth daily for 2 days 01/19/22  Yes Coreena Rubalcava, PalisadeWylder S, PA  ?acetaminophen (TYLENOL) 500 MG tablet Take 500-1,000 mg by mouth every 6 (six) hours as needed for mild pain or headache.    [provider]  ?albuterol (VENTOLIN HFA) 108 (90 Base) MCG/ACT inhaler Inhale 1-2 puffs into the lungs every 4 (four) hours as needed for wheezing or shortness of breath. 12/23/21   Loyola Mastudd, Stephen M, MD  ?ARIPiprazole (ABILIFY) 5 MG tablet Take 1 tablet (5 mg total) by mouth daily. 12/05/21   Nwoko, Tommas OlpUchenna E, PA  ?ibuprofen (ADVIL) 200 MG tablet Take 200-400 mg by mouth every 6 (six) hours as needed for mild pain or headache.    [provider]  ?lamoTRIgine (LAMICTAL) 25 MG tablet Take 2 tablets (50 mg total) by mouth daily. 12/05/21   Nwoko, Tommas OlpUchenna E, PA  ?naltrexone (DEPADE) 50 MG tablet Take 50 mg by mouth daily.    [provider]  ?naproxen (NAPROSYN) 500 MG tablet Take 1 tablet (500 mg total) by mouth 2 (two) times daily. 09/18/21   Jannifer FranklinPiontek, Erin, MD  ?NON FORMULARY Take 1-2 tablets by mouth See admin instructions. Emergen-C Citrus-Ginger Gummies, Turmeric and Ginger, Immune Support gummies- Chew 1-2 gummies by mouth daily    [provider]  ?ondansetron (ZOFRAN) 4 MG tablet Take 1 tablet (4 mg total) by mouth every 8 (eight) hours as needed for nausea or vomiting. 07/31/20   Linwood DibblesKnapp, Jon, MD  ?traZODone (DESYREL) 50 MG tablet Take 1 tablet (50 mg total) by mouth at bedtime. 12/05/21   Meta HatchetNwoko, Uchenna E, PA  ?   ? ?Allergies    ?Patient has no known allergies.   ? ?Review of Systems   ?Review of Systems  ?Respiratory:  Positive for shortness of breath.   ? ?Physical Exam ?Updated Vital Signs ?BP 121/78   Pulse 92   Temp 98.2 ?F (36.8 ?C) (Oral)   Resp 19   Ht 6' (1.829 m)   Wt 65.8 kg   SpO2 95%   BMI 19.67  kg/m?  ?Physical Exam ?Vitals and nursing note reviewed.  ?Constitutional:   ?   General: He is not in acute distress. ?   Comments: Pleasant 46 year old male in no acute distress.  ?HENT:  ?   Head: Normocephalic and atraumatic.  ?   Nose: Nose normal.  ?Eyes:  ?   General: No scleral icterus. ?Cardiovascular:  ?   Rate and Rhythm: Normal rate and regular rhythm.  ?   Pulses: Normal pulses.  ?   Heart sounds: Normal heart sounds.  ?Pulmonary:  ?   Effort: Pulmonary effort is normal. No respiratory distress.  ?   Breath sounds: Wheezing present.  ?   Comments: Patient is speaking in full sentences, not tachypneic, diffuse expiratory wheezes. ?Abdominal:  ?   Palpations: Abdomen is soft.  ?   Tenderness: There is no abdominal tenderness.  ?Musculoskeletal:  ?   Cervical back: Normal range of motion.  ?   Right  lower leg: No edema.  ?   Left lower leg: No edema.  ?Skin: ?   General: Skin is warm and dry.  ?   Capillary Refill: Capillary refill takes less than 2 seconds.  ?Neurological:  ?   Mental Status: He is alert. Mental status is at baseline.  ?Psychiatric:     ?   Mood and Affect: Mood normal.     ?   Behavior: Behavior normal.  ? ? ?ED Results / Procedures / Treatments   ?Labs ?(all labs ordered are listed, but only abnormal results are displayed) ?Labs Reviewed  ?RESP PANEL BY RT-PCR (FLU A&B, COVID) ARPGX2  ? ? ?EKG ?None ? ?Radiology ?No results found. ? ?Procedures ?Procedures  ? ? ?Medications Ordered in ED ?Medications  ?predniSONE (DELTASONE) tablet 60 mg (has no administration in time range)  ?aerochamber Z-Stat Plus/medium 1 each (has no administration in time range)  ?albuterol (VENTOLIN HFA) 108 (90 Base) MCG/ACT inhaler 4 puff (4 puffs Inhalation Given 01/19/22 2320)  ? ? ?ED Course/ Medical Decision Making/ A&P ?  ?                        ?Medical Decision Making ?Amount and/or Complexity of Data Reviewed ?Radiology: ordered. ? ?Risk ?OTC drugs. ?Prescription drug management. ? ? ?This patient presents to the ED for concern of SOB, this involves a number of treatment options, and is a complaint that carries with it a high risk of complications and morbidity.  The differential diagnosis includes The causes for shortness of breath include but are not limited to ?Cardiac (AHF, pericardial effusion and tamponade, arrhythmias, ischemia, etc) ?Respiratory (COPD, asthma, pneumonia, pneumothorax, primary pulmonary hypertension, PE/VQ mismatch) ?Hematological (anemia) ?Neuromuscular (ALS, Guillain-Barr?, etc)  ? ? ?Co morbidities: ?Discussed in HPI ? ? ?Brief History: ? ?Patient is a 46 year old male with past medical history significant for asthma.  He is presented today to the emergency room with symptoms of shortness of breath.  Denies any chest pain or hemoptysis.  ? ?No recent surgeries, hospitalization,  long travel, hemoptysis, estrogen containing OCP, cancer history.  No unilateral leg swelling.  No history of PE or VTE.  ? ?He states that he ran out of his albuterol inhaler.  He states that Used 2 doses before coming to the ER today which were his last 2. ? ? ?Physical exam notable for expiratory wheezing.  Not tachypneic or hypoxic. ? ? ? ?EMR reviewed including pt PMHx, past surgical history and past visits to ER.  ? ?  See HPI for more details ? ? ?Lab Tests: ? ? ?I ordered and independently interpreted labs. Labs notable for negative for COVID and influenza ? ? ?Imaging Studies: ? ?NAD. I personally reviewed all imaging studies and no acute abnormality found. I agree with radiology interpretation. ?I personally viewed chest x-ray images.  Negative for infiltrate or pneumothorax ? ? ?Cardiac Monitoring: ? ?The patient was maintained on a cardiac monitor.  I personally viewed and interpreted the cardiac monitored which showed an underlying rhythm of: NSR ?NA ? ? ?Medicines ordered: ? ?I ordered medication including albuterol MDI 4 puffs, prednisone, DuoNeb 5/5 for SOB/wheezing ?Reevaluation of the patient after these medicines showed that the patient improved ?I have reviewed the patients home medicines and have made adjustments as needed ? ? ?Critical Interventions: ? ? ? ? ?Consults/Attending Physician ? ? ? ? ? ?Reevaluation: ? ?After the interventions noted above I re-evaluated patient and found that they have :improved ? ? ?Social Determinants of Health: ? ?The patient's social determinants of health were a factor in the care of this patient ? ? ? ?Problem List / ED Course: ? ?Asthma.  Wheezing improved after MDI.  Reassessed still having some wheezing however.  Given DuoNeb with resolution of wheezing. ?Patient continues to be breathing at a normal rate not tachycardic or hypoxic.  States he feels significantly improved.  Will discharge home with present taper and AeroChamber and MDI inhaler. ? ?Given time  of year and rhinorrhea we will provide Zyrtec as well as uncontrolled allergic rhinitis is likely contributing to his asthma ? ? ?Dispostion: ? ?After consideration of the diagnostic results and the patients res

## 2022-01-19 NOTE — ED Triage Notes (Signed)
Pt reports with SHOB and cough since today. Pt reports having asthma and states that his inhaler is not working.  ?

## 2022-01-20 ENCOUNTER — Other Ambulatory Visit: Payer: Self-pay

## 2022-01-20 NOTE — Discharge Instructions (Signed)
Please take the prednisone as prescribed.  Please drink plenty of water, follow-up with your primary care provider. ? ?Please use albuterol 4 puffs every 4 hours for the next 24 hours after that you may use 2 puffs as needed for shortness of breath. ? ? ?Return to the emergency room for any new or concerning symptoms. ?

## 2022-01-30 ENCOUNTER — Telehealth (INDEPENDENT_AMBULATORY_CARE_PROVIDER_SITE_OTHER): Payer: 59 | Admitting: Physician Assistant

## 2022-01-30 ENCOUNTER — Other Ambulatory Visit: Payer: Self-pay

## 2022-01-30 DIAGNOSIS — F515 Nightmare disorder: Secondary | ICD-10-CM | POA: Diagnosis not present

## 2022-01-30 DIAGNOSIS — G4700 Insomnia, unspecified: Secondary | ICD-10-CM | POA: Diagnosis not present

## 2022-01-30 DIAGNOSIS — F319 Bipolar disorder, unspecified: Secondary | ICD-10-CM | POA: Diagnosis not present

## 2022-01-30 DIAGNOSIS — Z8659 Personal history of other mental and behavioral disorders: Secondary | ICD-10-CM | POA: Insufficient documentation

## 2022-01-30 MED ORDER — TRAZODONE HCL 150 MG PO TABS
150.0000 mg | ORAL_TABLET | Freq: Every day | ORAL | 1 refills | Status: DC
  Filled 2022-01-30 – 2022-03-04 (×2): qty 30, 30d supply, fill #0
  Filled 2022-04-03: qty 30, 30d supply, fill #1

## 2022-01-30 MED ORDER — LAMOTRIGINE 25 MG PO TABS
50.0000 mg | ORAL_TABLET | Freq: Every day | ORAL | 2 refills | Status: DC
  Filled 2022-01-30 – 2022-03-04 (×2): qty 60, 30d supply, fill #0
  Filled 2022-04-03: qty 60, 30d supply, fill #1

## 2022-01-30 MED ORDER — DOXAZOSIN MESYLATE 1 MG PO TABS
1.0000 mg | ORAL_TABLET | Freq: Every day | ORAL | 1 refills | Status: DC
  Filled 2022-01-30 – 2022-03-04 (×2): qty 30, 30d supply, fill #0
  Filled 2022-04-03: qty 30, 30d supply, fill #1

## 2022-01-30 MED ORDER — ARIPIPRAZOLE 5 MG PO TABS
5.0000 mg | ORAL_TABLET | Freq: Every day | ORAL | 2 refills | Status: DC
  Filled 2022-01-30 – 2022-03-04 (×2): qty 30, 30d supply, fill #0
  Filled 2022-04-03: qty 30, 30d supply, fill #1

## 2022-01-30 NOTE — Progress Notes (Addendum)
BH MD/PA/NP OP Progress Note ? ?Virtual Visit via Video Note ? ?I connected with Gregory Bishop Krock on 01/30/22 at  1:30 PM EDT by a video enabled telemedicine application and verified that I am speaking with the correct person using two identifiers. ? ?Location: ?Patient: Home ?Provider: Clinic ?  ?I discussed the limitations of evaluation and management by telemedicine and the availability of in person appointments. The patient expressed understanding and agreed to proceed. ? ?Follow Up Instructions: ? ?I discussed the assessment and treatment plan with the patient. The patient was provided an opportunity to ask questions and all were answered. The patient agreed with the plan and demonstrated an understanding of the instructions. ?  ?The patient was advised to call back or seek an in-person evaluation if the symptoms worsen or if the condition fails to improve as anticipated. ? ?I provided 22 minutes of non-face-to-face time during this encounter. ? ?Meta HatchetUchenna E Jazlyne Gauger, PA  ? ? ?01/30/2022 1:48 PM ?Gregory Bishop Getter  ?MRN:  [id number] ? ?Chief Complaint: No chief complaint on file. ? ?HPI:  ? ?Gregory LundCarlos Abeyta is a 46 year old male with a past psychiatric history significant for insomnia, bipolar 1 disorder, and nightmares who [facility] via virtual video visit for follow-up and medication management.  Patient is currently being managed on the following medications: ? ?Trazodone 50 mg at bedtime ?Abilify 5 mg daily ?Lamictal 50 mg daily ? ?Patient reports that he is not doing too well and is requesting refills on his medications.  He states that he recently started experiencing nightmares and crying in his sleep.  Per patient's girlfriend, patient has been talking in his sleep accompanied by tears streaming down his face while sleeping.  She reports that his dream was upsetting and would often wake up very irritable/agitated.  She states that the nightmares have been going on  for 2 months and while the patient has a past history of bad dreams and talking in his sleep, they have never been to this degree. ? ?Patient would like to continue taking his medications but states that either his Lamictal or Abilify makes him sleepy.  Patient states that he still experiences rage and always has mood swings.  Patient states that he often has the urge to punch things.  Patient also believes that his medications may be contributing to issues with his man parts (sexual dysfunction).  Provider informed patient to discuss with his primary care provider options for the management of his sexual dysfunction. ? ?Patient endorses depressive episodes roughly 3 days out of the week.  Patient's depressive symptoms are characterized by the following: feelings of sadness, lack of motivation, irritability, and increased appetite.  Patient also endorses anxiety and rates his anxiety an 8 out of 10.  Patient attributes his anxiety to being unable to find a job and being bored while at home.  Patient reports that he is currently trying to apply for disability.  Other issues that the patient reports having are issues with his memory.  Per patient, he will often not remember previous conversations.  Per patient's girlfriend, patient has a history of brain trauma after a terrible fall and his family has a history of early onset dementia.  Patient also endorses the following stressor: Comatose that mother was only able to communicate through blinking.  Patient's girlfriend states that it has been tough interacting with them and that he is easily triggered.  A PHQ-9 screen was performed with the patient scoring a 21.  A GAD-7 screen was also performed with the patient scoring an 18. ? ?Patient is alert and oriented x4, calm, cooperative, and fully engaged in conversation during the encounter.  Patient endorses okay mood and states that he is currently neutral.  Patient denies suicidal or homicidal ideations.  He denies  visual hallucinations but states that he occasionally hears things.  Patient is denying active auditory hallucinations and does not appear to be responding to internal/external stimuli.  Patient endorses poor sleep and receives on average 3 to 4 hours of sleep each night.  He reports that his trazodone used to be helpful in the management of his sleep but does not cut it at this point in time.  Patient endorses increased appetite and eats on average 3 meals per day.  Patient denies alcohol consumption and illicit drug use.  Patient endorses tobacco use and smokes on average 4 to 6 cigarettes each day. ? ?Visit Diagnosis:  ?  ICD-10-CM   ?1. Insomnia, unspecified type  G47.00 traZODone (DESYREL) 150 MG tablet  ?  ?2. Bipolar 1 disorder, depressed (HCC)  F31.9 lamoTRIgine (LAMICTAL) 25 MG tablet  ?  ARIPiprazole (ABILIFY) 5 MG tablet  ?  ?3. Nightmares  F51.5 doxazosin (CARDURA) 1 MG tablet  ?  ? ? ?Past Psychiatric History:  ?Bipolar I Disorder ? ?Past Medical History:  ?Past Medical History:  ?Diagnosis Date  ? Stroke [facility])   ?  ?Past Surgical History:  ?Procedure Laterality Date  ? LEG SURGERY Left 2014  ? ? ?Family Psychiatric History:  ?Patient denies a family history of psychiatric illness ? ?Family History:  ?Family History  ?Problem Relation Age of Onset  ? Diabetes Maternal Uncle   ? Diabetes Maternal Grandfather   ? ? ?Social History:  ?Social History  ? ?Socioeconomic History  ? Marital status: Significant Other  ?  Spouse name: Not on file  ? Number of children: 2  ? Years of education: Not on file  ? Highest education level: Not on file  ?Occupational History  ? Occupation: Unemployed  ?Tobacco Use  ? Smoking status: Every Day  ?  Packs/day: 0.25  ?  Types: Cigarettes  ? Smokeless tobacco: Never  ?Substance and Sexual Activity  ? Alcohol use: Not Currently  ?  Alcohol/week: 6.0 standard drinks  ?  Types: 6 Cans of beer per week  ?  Comment: sober 4 months  ? Drug use: No  ? Sexual activity: Yes  ?   Partners: Female  ?  Comment: 1 partner with sig other  ?Other Topics Concern  ? Not on file  ?Social History Narrative  ? Not on file  ? ?Social Determinants of Health  ? ?Financial Resource Strain: Low Risk   ? Difficulty of Paying Living Expenses: Not hard at all  ?Food Insecurity: No Food Insecurity  ? Worried About Programme researcher, broadcasting/film/video in the Last Year: Never true  ? Ran Out of Food in the Last Year: Never true  ?Transportation Needs: No Transportation Needs  ? Lack of Transportation (Medical): No  ? Lack of Transportation (Non-Medical): No  ?Physical Activity: Sufficiently Active  ? Days of Exercise per Week: 7 days  ? Minutes of Exercise per Session: 60 min  ?Stress: Stress Concern Present  ? Feeling of Stress : Rather much  ?Social Connections: Socially Isolated  ? Frequency of Communication with Friends and Family: Once a week  ? Frequency of Social Gatherings with Friends and Family: Never  ? Attends Religious Services:  Never  ? Active Member of Clubs or Organizations: No  ? Attends Banker Meetings: Never  ? Marital Status: Living with partner  ? ? ?Allergies: No Known Allergies ? ?Metabolic Disorder Labs: ?No results found for: HGBA1C, MPG ?No results found for: PROLACTIN ?Lab Results  ?Component Value Date  ? CHOL 184 12/23/2021  ? TRIG 96.0 12/23/2021  ? HDL 52.00 12/23/2021  ? CHOLHDL 4 12/23/2021  ? VLDL 19.2 12/23/2021  ? LDLCALC 113 (H) 12/23/2021  ? ?No results found for: TSH ? ?Therapeutic Level Labs: ?No results found for: LITHIUM ?No results found for: VALPROATE ?No components found for:  CBMZ ? ?Current Medications: ?Current Outpatient Medications  ?Medication Sig Dispense Refill  ? doxazosin (CARDURA) 1 MG tablet Take 1 tablet (1 mg total) by mouth daily. 30 tablet 1  ? acetaminophen (TYLENOL) 500 MG tablet Take 500-1,000 mg by mouth every 6 (six) hours as needed for mild pain or headache.    ? albuterol (VENTOLIN HFA) 108 (90 Base) MCG/ACT inhaler Inhale 1-2 puffs into the lungs  every 4 (four) hours as needed for wheezing or shortness of breath. 8.5 g 0  ? ARIPiprazole (ABILIFY) 5 MG tablet Take 1 tablet (5 mg total) by mouth daily. 30 tablet 2  ? cetirizine (ZYRTEC ALLERGY) 10 MG tab

## 2022-02-01 ENCOUNTER — Encounter (HOSPITAL_COMMUNITY): Payer: Self-pay | Admitting: Physician Assistant

## 2022-02-05 ENCOUNTER — Other Ambulatory Visit: Payer: Self-pay

## 2022-03-04 ENCOUNTER — Other Ambulatory Visit: Payer: Self-pay

## 2022-03-05 ENCOUNTER — Other Ambulatory Visit: Payer: Self-pay

## 2022-03-25 ENCOUNTER — Encounter: Payer: Self-pay | Admitting: Family Medicine

## 2022-03-25 ENCOUNTER — Ambulatory Visit (INDEPENDENT_AMBULATORY_CARE_PROVIDER_SITE_OTHER): Payer: 59 | Admitting: Family Medicine

## 2022-03-25 VITALS — BP 118/64 | HR 72 | Temp 97.7°F | Ht 72.0 in | Wt 140.0 lb

## 2022-03-25 DIAGNOSIS — F319 Bipolar disorder, unspecified: Secondary | ICD-10-CM

## 2022-03-25 DIAGNOSIS — F1021 Alcohol dependence, in remission: Secondary | ICD-10-CM

## 2022-03-25 DIAGNOSIS — R634 Abnormal weight loss: Secondary | ICD-10-CM | POA: Diagnosis not present

## 2022-03-25 NOTE — Progress Notes (Signed)
[facility] [address] [place] [address] Dept: [phone] Dept Fax: [phone]  Chronic Care Office Visit  Subjective:    Patient ID: Gregory Bishop, male    DOB: 1976-01-05, 46 y.o..   MRN: [id number]  Chief Complaint  Patient presents with   Follow-up    3 month f/u, c/o having weight loss (-8 lbs).     History of Present Illness:  Patient is in today for reassessment of chronic medical issues.  Mr. Maffei has an alcohol addiction. He notes he is maintaining his sobriety. He continues to attend two virtual AA sessions a day (8am, 5pm) and sees his psychiatric PA, Uchenna Nwoko, monthly. He remains on naltrexone 50 mg daily to assist him in maintaining sobriety. He has been looking for work, but finds that employers are not willing to work around the [employer] times.   Mr. Hallowell also has bipolar disorder and PTSD. He is managed on aripiprazole (Abilify) 5 mg daily and lamotrigine (Lamictal) 50 mg daily. He takes trazodone to help with sleep. He notes he is not having frequent nightmares at this point.   Mr. Lappe has noted some weight loss. He states he has always struggled to gain weight and finds he loses weight very easily. He is eating regular meals. He is not excessively active. He wonders if he might have a thyroid condition.  Past Medical History: Patient Active Problem List   Diagnosis Date Noted   History of nightmares 01/30/2022   Nightmares 01/30/2022   Wheezing 12/23/2021   Tobacco use disorder, mild, abuse 12/23/2021   Insomnia 12/05/2021   Alcohol use disorder, severe, in early remission (HCC) 07/24/2021   Bipolar 1 disorder, depressed (HCC) 07/21/2021   PTSD (post-traumatic stress disorder) 07/21/2021   Dizziness 05/22/2021   Past Surgical History:  Procedure Laterality Date   LEG SURGERY Left 2014   Family History  Problem Relation Age of Onset   Diabetes Maternal Uncle    Diabetes  Maternal Grandfather    Outpatient Medications Prior to Visit  Medication Sig Dispense Refill   acetaminophen (TYLENOL) 500 MG tablet Take 500-1,000 mg by mouth every 6 (six) hours as needed for mild pain or headache.     albuterol (VENTOLIN HFA) 108 (90 Base) MCG/ACT inhaler Inhale 1-2 puffs into the lungs every 4 (four) hours as needed for wheezing or shortness of breath. 8.5 g 0   ARIPiprazole (ABILIFY) 5 MG tablet Take 1 tablet (5 mg total) by mouth daily. 30 tablet 2   cetirizine (ZYRTEC ALLERGY) 10 MG tablet Take 1 tablet (10 mg total) by mouth daily. 28 tablet 0   doxazosin (CARDURA) 1 MG tablet Take 1 tablet (1 mg total) by mouth daily. 30 tablet 1   ibuprofen (ADVIL) 200 MG tablet Take 200-400 mg by mouth every 6 (six) hours as needed for mild pain or headache.     lamoTRIgine (LAMICTAL) 25 MG tablet Take 2 tablets (50 mg total) by mouth daily. 60 tablet 2   naltrexone (DEPADE) 50 MG tablet Take 50 mg by mouth daily.     naproxen (NAPROSYN) 500 MG tablet Take 1 tablet (500 mg total) by mouth 2 (two) times daily. 30 tablet 0   NON FORMULARY Take 1-2 tablets by mouth See admin instructions. Emergen-C Citrus-Ginger Gummies, Turmeric and Ginger, Immune Support gummies- Chew 1-2 gummies by mouth daily     ondansetron (ZOFRAN) 4 MG tablet Take 1 tablet (4 mg total) by mouth every 8 (  eight) hours as needed for nausea or vomiting. 12 tablet 0   traZODone (DESYREL) 150 MG tablet Take 1 tablet (150 mg total) by mouth at bedtime. 30 tablet 1   predniSONE (DELTASONE) 10 MG tablet Take 6 tabs by mouth daily  for 2 days, then 5 tabs for 2 days, then 4 tabs for 2 days, then 3 tabs for 2 days, 2 tabs for 2 days, then 1 tab by mouth daily for 2 days 42 tablet 0   No facility-administered medications prior to visit.   No Known Allergies    Objective:   Today's Vitals   03/25/22 1511  BP: 118/64  Pulse: 72  Temp: 97.7 F (36.5 C)  TempSrc: Temporal  SpO2: 94%  Weight: 140 lb (63.5 kg)  Height:  6' (1.829 m)   Body mass index is 18.99 kg/m.   General: Well developed, well nourished. No acute distress. Neuro: No tremor. Psych: Alert and oriented. Normal mood and affect.  Health Maintenance Due  Topic Date Due   COVID-19 Vaccine (2 - Pfizer series) 10/15/2020     Assessment & Plan:   1. Alcohol use disorder, severe, in early remission (HCC) Remains sober. Continue naltrexone 50 mg daily. I strongly encourage ongoing participation in AA.  2. Bipolar 1 disorder, depressed (HCC) Continue to work with [facility].  3. Unintended weight loss It is unclear as to the etiology of the recent weight loss (8 lbs in 3 months). I will check a TSH to rule-out thyroid disease. Other labs at last visit were normal. Some of this may be constitutional for Mr. Kucher. He has laready tried supplements and not found them to help. We will monitor this.  - TSH - T4, free  Return in about 6 months (around 09/24/2022) for Reassessment.   Loyola Mast, MD

## 2022-03-26 LAB — TSH: TSH: 0.5 u[IU]/mL (ref 0.35–5.50)

## 2022-03-26 LAB — T4, FREE: Free T4: 0.83 ng/dL (ref 0.60–1.60)

## 2022-04-03 ENCOUNTER — Other Ambulatory Visit: Payer: Self-pay

## 2022-04-03 ENCOUNTER — Telehealth (INDEPENDENT_AMBULATORY_CARE_PROVIDER_SITE_OTHER): Payer: 59 | Admitting: Physician Assistant

## 2022-04-03 ENCOUNTER — Other Ambulatory Visit (HOSPITAL_COMMUNITY): Payer: Self-pay

## 2022-04-03 DIAGNOSIS — G4700 Insomnia, unspecified: Secondary | ICD-10-CM

## 2022-04-03 DIAGNOSIS — F319 Bipolar disorder, unspecified: Secondary | ICD-10-CM | POA: Diagnosis not present

## 2022-04-03 DIAGNOSIS — F515 Nightmare disorder: Secondary | ICD-10-CM | POA: Diagnosis not present

## 2022-04-03 MED ORDER — LAMOTRIGINE 100 MG PO TABS
100.0000 mg | ORAL_TABLET | Freq: Every day | ORAL | 2 refills | Status: DC
  Filled 2022-04-03: qty 30, 30d supply, fill #0

## 2022-04-03 MED ORDER — ARIPIPRAZOLE 15 MG PO TABS
15.0000 mg | ORAL_TABLET | Freq: Every day | ORAL | 2 refills | Status: DC
  Filled 2022-04-03: qty 30, 30d supply, fill #0

## 2022-04-03 MED ORDER — DOXAZOSIN MESYLATE 2 MG PO TABS
2.0000 mg | ORAL_TABLET | Freq: Every day | ORAL | 2 refills | Status: DC
  Filled 2022-04-03: qty 30, 30d supply, fill #0

## 2022-04-03 MED ORDER — TRAZODONE HCL 300 MG PO TABS
150.0000 mg | ORAL_TABLET | Freq: Every day | ORAL | 2 refills | Status: DC
  Filled 2022-04-03: qty 15, 30d supply, fill #0
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
## 2022-04-08 ENCOUNTER — Other Ambulatory Visit: Payer: Self-pay

## 2022-04-08 ENCOUNTER — Encounter (HOSPITAL_COMMUNITY): Payer: Self-pay | Admitting: Physician Assistant

## 2022-04-08 MED ORDER — TRAZODONE HCL 150 MG PO TABS
300.0000 mg | ORAL_TABLET | Freq: Every day | ORAL | 2 refills | Status: DC
  Filled 2022-04-08: qty 15, 15d supply, fill #0
  Filled 2022-04-08: qty 60, 30d supply, fill #0
  Filled 2022-04-08: qty 30, 30d supply, fill #0

## 2022-04-10 ENCOUNTER — Other Ambulatory Visit: Payer: Self-pay

## 2022-06-05 ENCOUNTER — Telehealth (INDEPENDENT_AMBULATORY_CARE_PROVIDER_SITE_OTHER): Payer: 59 | Admitting: Physician Assistant

## 2022-06-05 ENCOUNTER — Encounter (HOSPITAL_COMMUNITY): Payer: Self-pay | Admitting: Physician Assistant

## 2022-06-05 ENCOUNTER — Other Ambulatory Visit: Payer: Self-pay

## 2022-06-05 DIAGNOSIS — G4700 Insomnia, unspecified: Secondary | ICD-10-CM | POA: Diagnosis not present

## 2022-06-05 DIAGNOSIS — F1021 Alcohol dependence, in remission: Secondary | ICD-10-CM | POA: Diagnosis not present

## 2022-06-05 DIAGNOSIS — F319 Bipolar disorder, unspecified: Secondary | ICD-10-CM

## 2022-06-05 DIAGNOSIS — F515 Nightmare disorder: Secondary | ICD-10-CM

## 2022-06-05 MED ORDER — ACAMPROSATE CALCIUM 333 MG PO TBEC
666.0000 mg | DELAYED_RELEASE_TABLET | Freq: Three times a day (TID) | ORAL | 1 refills | Status: DC
  Filled 2022-06-05: qty 180, 30d supply, fill #0
  Filled 2022-07-27: qty 180, 30d supply, fill #1

## 2022-06-05 MED ORDER — OLANZAPINE 10 MG PO TABS
10.0000 mg | ORAL_TABLET | Freq: Every day | ORAL | 2 refills | Status: DC
  Filled 2022-06-05: qty 30, 30d supply, fill #0

## 2022-06-05 MED ORDER — NALTREXONE HCL 50 MG PO TABS
50.0000 mg | ORAL_TABLET | Freq: Every day | ORAL | 2 refills | Status: DC
  Filled 2022-06-05: qty 30, 30d supply, fill #0

## 2022-06-05 MED ORDER — TRAZODONE HCL 150 MG PO TABS
300.0000 mg | ORAL_TABLET | Freq: Every day | ORAL | 1 refills | Status: DC
  Filled 2022-06-05: qty 60, 30d supply, fill #0

## 2022-06-05 MED ORDER — DOXAZOSIN MESYLATE 2 MG PO TABS
2.0000 mg | ORAL_TABLET | Freq: Every day | ORAL | 1 refills | Status: DC
  Filled 2022-06-05: qty 30, 30d supply, fill #0

## 2022-06-05 NOTE — Progress Notes (Unsigned)
BH MD/PA/NP OP Progress Note  Virtual Visit via Video Note  I connected with Gregory Bishop on 06/05/22 at  3:00 PM EDT by a video enabled telemedicine application and verified that I am speaking with the correct person using two identifiers.  Location: Patient: Home Provider: Clinic   I discussed the limitations of evaluation and management by telemedicine and the availability of in person appointments. The patient expressed understanding and agreed to proceed.  Follow Up Instructions:  I discussed the assessment and treatment plan with the patient. The patient was provided an opportunity to ask questions and all were answered. The patient agreed with the plan and demonstrated an understanding of the instructions.   The patient was advised to call back or seek an in-person evaluation if the symptoms worsen or if the condition fails to improve as anticipated.  I provided 33 minutes of non-face-to-face time during this encounter.  Meta Hatchet, PA    06/05/2022 3:00 PM Shayan Bramhall  MRN:  [id number]  Chief Complaint:  Chief Complaint  Patient presents with   Medication Refill   Follow-up    HPI:   Gregory Bishop is a 46 year old male with a past psychiatric history significant for insomnia, bipolar 1 disorder, and nightmares who presents to [facility], accompanied by his partner, via virtual video visit for follow-up and medication management.  Patient is currently being managed on the following medications:  Trazodone 300 mg at bedtime Abilify 15 mg daily Lamictal 100 mg daily Doxazosin 2 mg daily  Patient reports that his medications are not working.  Per patient's partner, patient has been more angry and has been prone to cursing at her.  She reports that the patient has no desire to do anything and has neglected activities of daily living such as not showering for a week.  She states that interactions with the patient always  turned into an argument and feels that she is at or with him.  She states that the patient will occasionally start taking his medications and crash as a result.  Patient endorses depression characterized by decreased appetite, irritability, low mood, and neglecting activities of daily living.  Patient's partner described the patient as having a "fuck the world" mentality."  Patient endorses anxiety and rates his anxiety at 10 out of 10.  Patient's partner's attributes patient's anxiety to interacting with his father whom is responsible for a lot of the patient,.  She reports that whenever the patient talks with his father, he becomes more emotional.  She reports that the patient also dealing with a stepmother who is very ill and noncommunicative at this time.  She states that these stressors are possibly exacerbating his current symptoms.  She reports that the patient is so stressed out that he has been chewing his fingernails as a result.  A PHQ-9 screen was performed with the patient scoring a 23.  A GAD-7 screen was also performed with the patient scoring a 16.  Patient is alert and oriented x4, calm, cooperative, and fully engaged in conversation during the encounter.  Patient reports that he has been more angry and more recently been upset due to trying to get in touch with his father.  Patient denies suicidal or homicidal ideations.  He further denies active auditory or visual hallucinations; however, he reports that he saw something 2 weeks ago characterized by shapes and forms.  Patient does not appear to be responding to internal/external stimuli.  Patient endorses poor sleep and  receives on average 4 hours of sleep.  Patient's partner states that the patient tosses and turns in his sleep all night long.  Patient endorses decreased appetite and eats on average one half meals per day.  In regards to alcohol consumption, patient is normally prescribed naltrexone 50 mg daily for the management of his  alcohol use disorder.  Patient's partner states that naltrexone has become increasingly ineffective in managing patient's alcohol use.  Patient denies alcohol consumption and illicit drug use.  Patient endorses tobacco use and smokes on average 5 cigarettes/day.  Visit Diagnosis:    ICD-10-CM   1. Alcohol use disorder, severe, in early remission (HCC)  F10.21 acamprosate (CAMPRAL) 333 MG tablet    naltrexone (DEPADE) 50 MG tablet    2. Bipolar 1 disorder, depressed (HCC)  F31.9 OLANZapine (ZYPREXA) 10 MG tablet    3. Insomnia, unspecified type  G47.00 traZODone (DESYREL) 150 MG tablet    4. Nightmares  F51.5 doxazosin (CARDURA) 2 MG tablet      Past Psychiatric History:  Bipolar I Disorder  Past Medical History:  Past Medical History:  Diagnosis Date   Stroke [facility](HCC)     Past Surgical History:  Procedure Laterality Date   LEG SURGERY Left 2014    Family Psychiatric History:  Patient denies a family history of psychiatric illness  Family History:  Family History  Problem Relation Age of Onset   Diabetes Maternal Uncle    Diabetes Maternal Grandfather     Social History:  Social History   Socioeconomic History   Marital status: Significant Other    Spouse name: Not on file   Number of children: 2   Years of education: Not on file   Highest education level: Not on file  Occupational History   Occupation: Unemployed  Tobacco Use   Smoking status: Every Day    Packs/day: 0.25    Types: Cigarettes   Smokeless tobacco: Never  Vaping Use   Vaping Use: Never used  Substance and Sexual Activity   Alcohol use: Not Currently    Alcohol/week: 6.0 standard drinks of alcohol    Types: 6 Cans of beer per week    Comment: sober 4 months   Drug use: No   Sexual activity: Yes    Partners: Female    Comment: 1 partner with sig other  Other Topics Concern   Not on file  Social History Narrative   Not on file   Social Determinants of Health   Financial Resource Strain:  Low Risk  (07/21/2021)   Overall Financial Resource Strain (CARDIA)    Difficulty of Paying Living Expenses: Not hard at all  Food Insecurity: No Food Insecurity (07/21/2021)   Hunger Vital Sign    Worried About Running Out of Food in the Last Year: Never true    Ran Out of Food in the Last Year: Never true  Transportation Needs: No Transportation Needs (07/21/2021)   PRAPARE - Administrator, Civil ServiceTransportation    Lack of Transportation (Medical): No    Lack of Transportation (Non-Medical): No  Physical Activity: Sufficiently Active (07/21/2021)   Exercise Vital Sign    Days of Exercise per Week: 7 days    Minutes of Exercise per Session: 60 min  Stress: Stress Concern Present (07/21/2021)   [employer] - Occupational Stress Questionnaire    Feeling of Stress : Rather much  Social Connections: Socially Isolated (07/21/2021)   Social Connection and Isolation Panel [NHANES]    Frequency of Communication  with Friends and Family: Once a week    Frequency of Social Gatherings with Friends and Family: Never    Attends Religious Services: Never    Database administrator or Organizations: No    Attends Engineer, structural: Never    Marital Status: Living with partner    Allergies: No Known Allergies  Metabolic Disorder Labs: No results found for: "HGBA1C", "MPG" No results found for: "PROLACTIN" Lab Results  Component Value Date   CHOL 184 12/23/2021   TRIG 96.0 12/23/2021   HDL 52.00 12/23/2021   CHOLHDL 4 12/23/2021   VLDL 19.2 12/23/2021   LDLCALC 113 (H) 12/23/2021   Lab Results  Component Value Date   TSH 0.50 03/25/2022    Therapeutic Level Labs: No results found for: "LITHIUM" No results found for: "VALPROATE" No results found for: "CBMZ"  Current Medications: Current Outpatient Medications  Medication Sig Dispense Refill   acamprosate (CAMPRAL) 333 MG tablet Take 2 tablets (666 mg total) by mouth 3 (three) times daily with meals. 180 tablet 1    OLANZapine (ZYPREXA) 10 MG tablet Take 1 tablet (10 mg total) by mouth at bedtime. 30 tablet 2   acetaminophen (TYLENOL) 500 MG tablet Take 500-1,000 mg by mouth every 6 (six) hours as needed for mild pain or headache.     albuterol (VENTOLIN HFA) 108 (90 Base) MCG/ACT inhaler Inhale 1-2 puffs into the lungs every 4 (four) hours as needed for wheezing or shortness of breath. 8.5 g 0   cetirizine (ZYRTEC ALLERGY) 10 MG tablet Take 1 tablet (10 mg total) by mouth daily. 28 tablet 0   doxazosin (CARDURA) 2 MG tablet Take 1 tablet (2 mg total) by mouth at bedtime. 30 tablet 1   ibuprofen (ADVIL) 200 MG tablet Take 200-400 mg by mouth every 6 (six) hours as needed for mild pain or headache.     lamoTRIgine (LAMICTAL) 100 MG tablet Take 1 tablet (100 mg total) by mouth daily. 30 tablet 2   naltrexone (DEPADE) 50 MG tablet Take 1 tablet (50 mg total) by mouth daily. 30 tablet 2   naproxen (NAPROSYN) 500 MG tablet Take 1 tablet (500 mg total) by mouth 2 (two) times daily. 30 tablet 0   NON FORMULARY Take 1-2 tablets by mouth See admin instructions. Emergen-C Citrus-Ginger Gummies, Turmeric and Ginger, Immune Support gummies- Chew 1-2 gummies by mouth daily     ondansetron (ZOFRAN) 4 MG tablet Take 1 tablet (4 mg total) by mouth every 8 (eight) hours as needed for nausea or vomiting. 12 tablet 0   traZODone (DESYREL) 150 MG tablet Take 2 tablets (300 mg total) by mouth at bedtime. 60 tablet 1   No current facility-administered medications for this visit.     Musculoskeletal: Strength & Muscle Tone: Unable to assess due to telemedicine visit Gait & Station: Unable to assess due to telemedicine visit Patient leans: Unable to assess due to telemedicine visit  Psychiatric Specialty Exam: Review of Systems  Psychiatric/Behavioral:  Positive for agitation, decreased concentration, hallucinations and sleep disturbance. Negative for dysphoric mood, self-injury and suicidal ideas. The patient is  nervous/anxious. The patient is not hyperactive.     There were no vitals taken for this visit.There is no height or weight on file to calculate BMI.  General Appearance: Casual  Eye Contact:  Good  Speech:  Clear and Coherent and Normal Rate  Volume:  Normal  Mood:  Anxious, Depressed, and Irritable  Affect:  Congruent and Depressed  Thought  Process:  Coherent, Goal Directed, and Descriptions of Associations: Intact  Orientation:  Full (Time, Place, and Person)  Thought Content: WDL   Suicidal Thoughts:  No  Homicidal Thoughts:  No  Memory:  Immediate;   Good Recent;   Fair Remote;   Fair  Judgement:  Fair  Insight:  Fair  Psychomotor Activity:  Normal  Concentration:  Concentration: Fair and Attention Span: Fair  Recall:  [employer] of Knowledge: Fair  Language: Good  Akathisia:  No  Handed:  Right  AIMS (if indicated): not done  Assets:  Communication Skills Desire for Improvement Housing Intimacy  ADL's:  Intact  Cognition: WNL  Sleep:  Poor   Screenings: CAGE-AID    Flowsheet Row ED to Hosp-Admission (Discharged) from 05/22/2021 in [facility]  CAGE-AID Score 3      GAD-7    Flowsheet Row Video Visit from 06/05/2022 in [facility] Video Visit from 04/03/2022 in [facility] Video Visit from 01/30/2022 in [facility] Office Visit from 12/23/2021 in [facility] Video Visit from 12/05/2021 in [facility]  Total GAD-7 Score 16 18 18 18 5       PHQ2-9    Flowsheet Row Video Visit from 06/05/2022 in [facility] Video Visit from 04/03/2022 in [facility] Video Visit from 01/30/2022 in [facility] Office Visit from 12/23/2021 in [facility] Video Visit from 12/05/2021 in [facility]  PHQ-2  Total Score 6 5 6 4  0  PHQ-9 Total Score 23 19 21 18  --      Flowsheet Row Video Visit from 06/05/2022 in [facility] Video Visit from 04/03/2022 in [facility] Video Visit from 01/30/2022 in [facility]  C-SSRS RISK CATEGORY No Risk No Risk No Risk        Assessment and Plan:   Gerrick Ray is a 46 year old male with a past psychiatric history significant for insomnia, bipolar 1 disorder, and nightmares who presents to [facility] via virtual video visit for follow-up and medication management.  Patient reports that his medications have been minimally effective in managing his symptoms.  Per patient's partner, patient has no desire to do anything and has been neglecting activities of daily living.  She states that interactions with the patient often and an arguments.  Patient also endorses worsening depression and elevated anxiety.  Due to patient's ongoing symptoms in the presence of the use of Abilify, patient to discontinue taking Abilify and patient to be placed on olanzapine 10 mg at bedtime for the management of his mood.  Patient would like to continue taking trazodone just in case he is unable to sleep at night.  Provider to add on acamprosate 333 mg 2 tablets 3 times a day for the management of patient's alcohol use disorder.  Patient was agreeable to recommendations.  Patient's medications to be prescribed to pharmacy of choice.  Collaboration of Care: Collaboration of Care: Medication Management AEB provider managing patient's psychiatric medications, Primary Care Provider AEB patient being seen at [facility], and Psychiatrist AEB patient being followed by this mental health provider  Patient/Guardian was advised Release of Information must be obtained prior to any record release in order to collaborate their care with an outside  provider. Patient/Guardian was  advised if they have not already done so to contact the registration department to sign all necessary forms in order for [place] to release information regarding their care.   Consent: Patient/Guardian gives verbal consent for treatment and assignment of benefits for services provided during this visit. Patient/Guardian expressed understanding and agreed to proceed.   1. Alcohol use disorder, severe, in early remission (HCC)  - acamprosate (CAMPRAL) 333 MG tablet; Take 2 tablets (666 mg total) by mouth 3 (three) times daily with meals.  Dispense: 180 tablet; Refill: 1 - naltrexone (DEPADE) 50 MG tablet; Take 1 tablet (50 mg total) by mouth daily.  Dispense: 30 tablet; Refill: 2  2. Bipolar 1 disorder, depressed (HCC)  - OLANZapine (ZYPREXA) 10 MG tablet; Take 1 tablet (10 mg total) by mouth at bedtime.  Dispense: 30 tablet; Refill: 2  3. Insomnia, unspecified type  - traZODone (DESYREL) 150 MG tablet; Take 2 tablets (300 mg total) by mouth at bedtime.  Dispense: 60 tablet; Refill: 1  4. Nightmares  - doxazosin (CARDURA) 2 MG tablet; Take 1 tablet (2 mg total) by mouth at bedtime.  Dispense: 30 tablet; Refill: 1  Patient to follow up in 6 weeks Provider spent a total of 33 minutes with the patient after reviewing patient's chart  Meta Hatchet, PA 06/05/2022, 3:00 PM

## 2022-06-08 ENCOUNTER — Other Ambulatory Visit: Payer: Self-pay

## 2022-06-09 ENCOUNTER — Other Ambulatory Visit: Payer: Self-pay

## 2022-07-14 ENCOUNTER — Telehealth (INDEPENDENT_AMBULATORY_CARE_PROVIDER_SITE_OTHER): Payer: Commercial Managed Care - HMO | Admitting: Student in an Organized Health Care Education/Training Program

## 2022-07-14 ENCOUNTER — Other Ambulatory Visit: Payer: Self-pay

## 2022-07-14 DIAGNOSIS — F319 Bipolar disorder, unspecified: Secondary | ICD-10-CM

## 2022-07-14 DIAGNOSIS — F515 Nightmare disorder: Secondary | ICD-10-CM

## 2022-07-14 DIAGNOSIS — F1021 Alcohol dependence, in remission: Secondary | ICD-10-CM

## 2022-07-14 DIAGNOSIS — G4700 Insomnia, unspecified: Secondary | ICD-10-CM

## 2022-07-14 MED ORDER — OLANZAPINE 15 MG PO TABS
15.0000 mg | ORAL_TABLET | Freq: Every day | ORAL | 1 refills | Status: DC
  Filled 2022-07-14 – 2022-07-27 (×2): qty 30, 30d supply, fill #0

## 2022-07-14 MED ORDER — TRAZODONE HCL 150 MG PO TABS
300.0000 mg | ORAL_TABLET | Freq: Every day | ORAL | 1 refills | Status: DC
  Filled 2022-07-14 – 2022-07-27 (×2): qty 60, 30d supply, fill #0

## 2022-07-14 MED ORDER — DOXAZOSIN MESYLATE 2 MG PO TABS
2.0000 mg | ORAL_TABLET | Freq: Every day | ORAL | 1 refills | Status: DC
  Filled 2022-07-14 – 2022-07-27 (×2): qty 30, 30d supply, fill #0

## 2022-07-14 MED ORDER — NALTREXONE HCL 50 MG PO TABS
50.0000 mg | ORAL_TABLET | Freq: Every day | ORAL | 1 refills | Status: DC
  Filled 2022-07-14 – 2022-07-27 (×2): qty 30, 30d supply, fill #0

## 2022-07-14 NOTE — Progress Notes (Signed)
Virtual Visit via Video Note  I connected with Gregory Bishop on 07/14/22 at  3:00 PM EDT by a video enabled telemedicine application and verified that I am speaking with the correct person using two identifiers.  Location: Patient: Home Provider: [facility]   I discussed the limitations of evaluation and management by telemedicine and the availability of in person appointments. The patient expressed understanding and agreed to proceed.  History of Present Illness:  Gregory Bishop is a 46 yr old male who presents via virtual video visit for follow up and medication management.  PPHx is significant for Bipolar Disorder, EtOH Abuse, Insomnia, and Nightmares.  Patient presents with his significant other Gregory Bishop.  He reports that he has noticed an improvement since starting the Zyprexa.  Gregory Bishop reports that patient has even started attending to self-care like taking a shower on his own without it becoming a big argument all the time which it had become.  He reports that he has not had any alcohol cravings since starting the Acomprosate.  He reports that he is still having some nightmares but they are not as often and they are much improved.  When asked about mood swings they both report that he is still having these.  Gregory Bishop reports that she is often the subject of these anger outbursts.  Discussed with him that we could consider starting a medication such as Depakote or Trileptal.  However, discussed that we would need to get lab work first given all the different medications he is on.  Discussed he would need to get a CMP to ensure his liver enzymes are not elevated but also since he is on an antipsychotic he would benefit from this lab as well as A1c, lipid panel, and CBC.  Also discussed getting a repeat EKG with his PCP.  They both reported understanding and Gregory Bishop reports she will call his PCP's office to schedule this appointment.  He reports no SI, HI, or AVH.  He does report he occasionally  hears voices but that is very rare and much improved since starting his medications back.  He reports sleep is doing good.  He reports appetite is doing good.  He reports no side effects to his medications.  He reports no other concerns at present.    Observations/Objective:  Psychiatric Specialty Exam: Physical Exam Constitutional:      General: He is not in acute distress.    Appearance: Normal appearance. He is normal weight. He is not ill-appearing or toxic-appearing.  HENT:     Head: Normocephalic and atraumatic.  Pulmonary:     Effort: Pulmonary effort is normal.  Neurological:     General: No focal deficit present.     Mental Status: He is alert.     Review of Systems  Respiratory:  Negative for cough and shortness of breath.   Cardiovascular:  Negative for chest pain.  Gastrointestinal:  Negative for abdominal pain, constipation, diarrhea, nausea and vomiting.  Neurological:  Negative for weakness and headaches.  Psychiatric/Behavioral:  Positive for agitation, behavioral problems and hallucinations (occasional AH). Negative for dysphoric mood, self-injury, sleep disturbance and suicidal ideas. The patient is not nervous/anxious.     There were no vitals taken for this visit.There is no height or weight on file to calculate BMI.  General Appearance: Casual and Fairly Groomed  Eye Contact:  Good  Speech:  Clear and Coherent and Normal Rate  Volume:  Normal  Mood:  Euthymic  Affect:  Appropriate and Congruent  Thought Process:  Coherent and Linear  Orientation:  Full (Time, Place, and Person)  Thought Content:  WDL and Logical  Suicidal Thoughts:  No  Homicidal Thoughts:  No  Memory:  Immediate;   Fair Recent;   Fair  Judgement:  Intact  Insight:  Shallow  Psychomotor Activity:  Normal  Concentration:  Concentration: Good and Attention Span: Good  Recall:  [employer] of Knowledge:  Fair  Language:  Good  Akathisia:  Negative  Handed:  Right  AIMS (if indicated):      Assets:  Communication Skills Desire for Improvement Housing Intimacy Resilience Social Support  ADL's:  Impaired mildly  Cognition:  WNL  Sleep:   good    Assessment and Plan:  [name] is a 46 yr old male who presents via virtual video visit for follow up and medication management.  PPHx is significant for Bipolar Disorder, EtOH Abuse, Insomnia, and Nightmares.   Irving has had improvements with his latest medication changes.  He has started to attend to his ADLs and has not had any cravings of alcohol.  However, both he and his significant other report he is still having issues with mood swings and irritability.  We will increase his Zyprexa at this time.  He will make an appointment with his PCP to get following lab work: Lipid panel, A1c, CBC, CMP and also an EKG.  Once those results are back we will consider starting a mood stabilizer-Depakote or Trileptal.  He will return for follow-up in 4 to 6 weeks.   Bipolar 1 Disorder, Depressed: -Increase Zyprexa to 15 mg QHS.  30 tablets with 1 refill.   Alcohol Use Disorder, severe, in early remission: -Continue Acamprosate 666 mg TID with meals.  180 (333 mg) tablets with 1 refill. -Continue Naltrexone 50 mg daily. 30 tablets with 1 refill.   Insomnia, unspecified type: -Continue Trazodone 300 mg QHS.  60 (150 mg) tablets with 1 refill.    Nightmares: -Continue Doxazosin 2 mg QHS. 30 tablets with 1 refill.    Follow Up Instructions:    I discussed the assessment and treatment plan with the patient. The patient was provided an opportunity to ask questions and all were answered. The patient agreed with the plan and demonstrated an understanding of the instructions.   The patient was advised to call back or seek an in-person evaluation if the symptoms worsen or if the condition fails to improve as anticipated.  I provided 25 minutes of non-face-to-face time during this encounter.   [name], MD

## 2022-07-17 ENCOUNTER — Telehealth (HOSPITAL_COMMUNITY): Payer: 59 | Admitting: Physician Assistant

## 2022-07-21 ENCOUNTER — Other Ambulatory Visit: Payer: Self-pay

## 2022-07-27 ENCOUNTER — Other Ambulatory Visit: Payer: Self-pay

## 2022-07-28 ENCOUNTER — Other Ambulatory Visit: Payer: Self-pay

## 2022-07-29 ENCOUNTER — Other Ambulatory Visit: Payer: Self-pay

## 2022-07-29 MED ORDER — COVID-19 MRNA 2023-2024 VACCINE (COMIRNATY) 0.3 ML INJECTION
INTRAMUSCULAR | 0 refills | Status: DC
  Filled 2022-07-29: qty 0.3, 1d supply, fill #0

## 2022-08-14 ENCOUNTER — Encounter (HOSPITAL_COMMUNITY): Payer: Commercial Managed Care - HMO | Admitting: Student in an Organized Health Care Education/Training Program

## 2022-08-14 ENCOUNTER — Encounter (HOSPITAL_COMMUNITY): Payer: Self-pay

## 2022-08-18 ENCOUNTER — Encounter (HOSPITAL_COMMUNITY): Payer: Self-pay | Admitting: Licensed Clinical Social Worker

## 2022-08-18 ENCOUNTER — Ambulatory Visit (HOSPITAL_COMMUNITY): Payer: Commercial Managed Care - HMO | Admitting: Licensed Clinical Social Worker

## 2022-08-18 ENCOUNTER — Encounter (HOSPITAL_COMMUNITY): Payer: Self-pay

## 2022-08-18 DIAGNOSIS — F319 Bipolar disorder, unspecified: Secondary | ICD-10-CM

## 2022-08-18 DIAGNOSIS — F4310 Post-traumatic stress disorder, unspecified: Secondary | ICD-10-CM

## 2022-08-18 DIAGNOSIS — Z8659 Personal history of other mental and behavioral disorders: Secondary | ICD-10-CM

## 2022-08-18 NOTE — Plan of Care (Signed)
Pt verbally agreeable to plan  
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
## 2022-08-21 ENCOUNTER — Telehealth (INDEPENDENT_AMBULATORY_CARE_PROVIDER_SITE_OTHER): Payer: Commercial Managed Care - HMO | Admitting: Student in an Organized Health Care Education/Training Program

## 2022-08-21 ENCOUNTER — Other Ambulatory Visit: Payer: Self-pay

## 2022-08-21 DIAGNOSIS — F319 Bipolar disorder, unspecified: Secondary | ICD-10-CM

## 2022-08-21 DIAGNOSIS — F1021 Alcohol dependence, in remission: Secondary | ICD-10-CM

## 2022-08-21 DIAGNOSIS — F515 Nightmare disorder: Secondary | ICD-10-CM

## 2022-08-21 DIAGNOSIS — G4700 Insomnia, unspecified: Secondary | ICD-10-CM

## 2022-08-21 DIAGNOSIS — Z79899 Other long term (current) drug therapy: Secondary | ICD-10-CM

## 2022-08-21 DIAGNOSIS — R062 Wheezing: Secondary | ICD-10-CM

## 2022-08-21 MED ORDER — OLANZAPINE 5 MG PO TABS
ORAL_TABLET | ORAL | 1 refills | Status: DC
  Filled 2022-08-21 – 2022-09-01 (×2): qty 90, 30d supply, fill #0

## 2022-08-21 MED ORDER — TRAZODONE HCL 150 MG PO TABS
300.0000 mg | ORAL_TABLET | Freq: Every day | ORAL | 1 refills | Status: DC
  Filled 2022-08-21 – 2022-09-01 (×2): qty 60, 30d supply, fill #0

## 2022-08-21 MED ORDER — ACAMPROSATE CALCIUM 333 MG PO TBEC
666.0000 mg | DELAYED_RELEASE_TABLET | Freq: Three times a day (TID) | ORAL | 1 refills | Status: DC
  Filled 2022-08-21 – 2022-09-01 (×2): qty 180, 30d supply, fill #0

## 2022-08-21 MED ORDER — DOXAZOSIN MESYLATE 2 MG PO TABS
2.0000 mg | ORAL_TABLET | Freq: Every day | ORAL | 1 refills | Status: DC
  Filled 2022-08-21 – 2022-09-01 (×2): qty 30, 30d supply, fill #0

## 2022-08-21 MED ORDER — ALBUTEROL SULFATE HFA 108 (90 BASE) MCG/ACT IN AERS
1.0000 | INHALATION_SPRAY | RESPIRATORY_TRACT | 0 refills | Status: DC | PRN
Start: 2022-08-21 — End: 2022-10-26
  Filled 2022-08-21: qty 6.7, 17d supply, fill #0
  Filled 2022-09-01: qty 6.7, 25d supply, fill #0

## 2022-08-21 MED ORDER — NALTREXONE HCL 50 MG PO TABS
50.0000 mg | ORAL_TABLET | Freq: Every day | ORAL | 1 refills | Status: DC
Start: 2022-08-21 — End: 2022-09-14
  Filled 2022-08-21 – 2022-09-01 (×2): qty 30, 30d supply, fill #0

## 2022-08-21 NOTE — Progress Notes (Signed)
Virtual Visit via Video Note  I connected with Gregory Bishop on 08/21/22 at  4:00 PM EST by a video enabled telemedicine application and verified that I am speaking with the correct person using two identifiers.  Location: Patient: Home Provider: [facility]   I discussed the limitations of evaluation and management by telemedicine and the availability of in person appointments. The patient expressed understanding and agreed to proceed.  History of Present Illness:  Gregory Bishop is a 46 yr old male who presents via virtual video visit for follow up and medication management.  PPHx is significant for Bipolar Disorder, EtOH Abuse, Insomnia, and Nightmares.   Patient presents with his significant other [name].  They report that with the increase in Zyprexa they have not noticed a significant improvement in his mood.  They report that when he takes his medicine at night his mood is good but during the day as the medicine is at its lowest point he will have what they call "bf's" (bitch fits).  He reports his sleep is still poor.  He reports he sleeps about 4 hours a night but that he also naps during the day and so will toss and turn a lot at night.  Discussed sleep hygiene and limiting naps during the day.  Discussed splitting his Zyprexa dose to 5 in the morning and 10 at night to provide better mood stability throughout the day.  He reports he has an appointment for his PCP December 6 and so we will get the blood work done at that time.  They both report that the combination of naltrexone and acamprosate have been very successful in controlling his alcohol cravings.  He reports no SI, HI, or VH.  He does report hearing occasional voices but is still improved with his medications.  He reports his sleep is okay hours wise but because he naps a lot during the day is very fragmented.  He reports appetite is doing okay.  He reports having shortness of breath due to his asthma and not having an inhaler.   Discussed I would send in a refill for the inhaler as he will not have a PCP appointment until December.  He reports no other concerns at present.  He will return for follow-up in approximately 6 weeks.    Observations/Objective:  Psychiatric Specialty Exam: Physical Exam Constitutional:      General: He is not in acute distress.    Appearance: He is not ill-appearing or toxic-appearing.  HENT:     Head: Normocephalic and atraumatic.  Pulmonary:     Effort: Pulmonary effort is normal.  Neurological:     General: No focal deficit present.     Mental Status: He is alert.     Review of Systems  Respiratory:  Positive for shortness of breath (Asthma). Negative for cough.   Cardiovascular:  Negative for chest pain.  Gastrointestinal:  Negative for abdominal pain, constipation, diarrhea, nausea and vomiting.  Neurological:  Negative for weakness and headaches.  Psychiatric/Behavioral:  Positive for agitation (improved), hallucinations (occasional AH) and sleep disturbance. Negative for dysphoric mood and suicidal ideas. The patient is not nervous/anxious.     There were no vitals taken for this visit.There is no height or weight on file to calculate BMI.  General Appearance: Casual and Fairly Groomed  Eye Contact:  Fair  Speech:  Clear and Coherent and Normal Rate  Volume:  Normal  Mood:   "ok"  Affect:  Appropriate and Congruent  Thought Process:  Coherent  and Goal Directed  Orientation:  Full (Time, Place, and Person)  Thought Content:  WDL and Logical  Suicidal Thoughts:  No  Homicidal Thoughts:  No  Memory:  Immediate;   Good  Judgement:  Good  Insight:  Good  Psychomotor Activity:  Normal  Concentration:  Concentration: Good and Attention Span: Good  Recall:  Good  Fund of Knowledge:  Good  Language:  Good  Akathisia:  Negative  Handed:  Right  AIMS (if indicated):     Assets:  Communication Skills Desire for Improvement Housing Intimacy Resilience Social Support   ADL's:  Impaired mildly  Cognition:  WNL  Sleep:   fair     Assessment and Plan:  [name] is a 46 yr old male who presents via virtual video visit for follow up and medication management.  PPHx is significant for Bipolar Disorder, EtOH Abuse, Insomnia, and Nightmares.    Jaquari has had minimal improvement with his increase of Zyprexa.  Since he has worsening of his mood during the day when his concentration of Zyprexa is lesser we will change his Zyprexa dosing.  We will start a morning dose of 5 mg and 10 mg at night to keep the total dosage the same but better provide mood stability throughout the day.  Have placed orders for Lipid panel, A1c, CBC, CMP and an EKG to be done at his PCP appointment Dec 15.  We will return for follow up in approximately 6 weeks.    He does report having significant issues with his Asthma and that he is out of refills of his inhaler, will provide a one time refill to bridge him to his PCP appointment in December.   Bipolar 1 Disorder, Depressed: -Change Zyprexa to 5 mg AM and 10 mg QHS.  90 (5 mg) tablets with 1 refill.     Alcohol Use Disorder, severe, in early remission: -Continue Acamprosate 666 mg TID with meals.  180 (333 mg) tablets with 1 refill. -Continue Naltrexone 50 mg daily. 30 tablets with 1 refill.     Insomnia, unspecified type: -Continue Trazodone 300 mg QHS.  60 (150 mg) tablets with 1 refill.     Nightmares: -Continue Doxazosin 2 mg QHS. 30 tablets with 1 refill.   -Albuterol refill sent.   Follow Up Instructions:    I discussed the assessment and treatment plan with the patient. The patient was provided an opportunity to ask questions and all were answered. The patient agreed with the plan and demonstrated an understanding of the instructions.   The patient was advised to call back or seek an in-person evaluation if the symptoms worsen or if the condition fails to improve as anticipated.  I provided 20 minutes of  non-face-to-face time during this encounter.   [name], MD

## 2022-08-22 ENCOUNTER — Encounter (HOSPITAL_COMMUNITY): Payer: Self-pay | Admitting: Student in an Organized Health Care Education/Training Program

## 2022-08-24 ENCOUNTER — Other Ambulatory Visit: Payer: Self-pay

## 2022-08-25 ENCOUNTER — Other Ambulatory Visit: Payer: Self-pay

## 2022-08-31 ENCOUNTER — Other Ambulatory Visit: Payer: Self-pay

## 2022-09-01 ENCOUNTER — Other Ambulatory Visit: Payer: Self-pay

## 2022-09-14 ENCOUNTER — Telehealth (INDEPENDENT_AMBULATORY_CARE_PROVIDER_SITE_OTHER): Payer: Commercial Managed Care - HMO | Admitting: Student in an Organized Health Care Education/Training Program

## 2022-09-14 ENCOUNTER — Other Ambulatory Visit: Payer: Self-pay

## 2022-09-14 DIAGNOSIS — F515 Nightmare disorder: Secondary | ICD-10-CM

## 2022-09-14 DIAGNOSIS — F319 Bipolar disorder, unspecified: Secondary | ICD-10-CM

## 2022-09-14 DIAGNOSIS — G4700 Insomnia, unspecified: Secondary | ICD-10-CM

## 2022-09-14 DIAGNOSIS — F1021 Alcohol dependence, in remission: Secondary | ICD-10-CM

## 2022-09-14 MED ORDER — ACAMPROSATE CALCIUM 333 MG PO TBEC
666.0000 mg | DELAYED_RELEASE_TABLET | Freq: Three times a day (TID) | ORAL | 1 refills | Status: DC
  Filled 2022-09-14: qty 180, 30d supply, fill #0

## 2022-09-14 MED ORDER — MIRTAZAPINE 7.5 MG PO TABS
7.5000 mg | ORAL_TABLET | Freq: Every day | ORAL | 1 refills | Status: DC
  Filled 2022-09-14: qty 30, 30d supply, fill #0

## 2022-09-14 MED ORDER — DOXAZOSIN MESYLATE 2 MG PO TABS
2.0000 mg | ORAL_TABLET | Freq: Every day | ORAL | 1 refills | Status: DC
  Filled 2022-09-14: qty 30, 30d supply, fill #0

## 2022-09-14 MED ORDER — OLANZAPINE 15 MG PO TABS
15.0000 mg | ORAL_TABLET | Freq: Every day | ORAL | 1 refills | Status: DC
  Filled 2022-09-14: qty 30, 30d supply, fill #0

## 2022-09-14 MED ORDER — TRAZODONE HCL 150 MG PO TABS
300.0000 mg | ORAL_TABLET | Freq: Every day | ORAL | 1 refills | Status: DC
  Filled 2022-09-14: qty 60, 30d supply, fill #0

## 2022-09-14 MED ORDER — NALTREXONE HCL 50 MG PO TABS
50.0000 mg | ORAL_TABLET | Freq: Every day | ORAL | 1 refills | Status: DC
  Filled 2022-09-14: qty 30, 30d supply, fill #0

## 2022-09-14 NOTE — Progress Notes (Signed)
Virtual Visit via Video Note  I connected with [name] on 09/14/22 at  3:30 PM EST by a video enabled telemedicine application and verified that I am speaking with the correct person using two identifiers.  Location: Patient: Home Provider: [facility]   I discussed the limitations of evaluation and management by telemedicine and the availability of in person appointments. The patient expressed understanding and agreed to proceed.  History of Present Illness:  Gregory Bishop is a 46 yr old male who presents via virtual video visit for follow up and medication management.  PPHx is significant for Bipolar Disorder, EtOH Abuse, Insomnia, and Nightmares.    Patient presents with his significant other [name].   He reports that things have not been going that well since her last appointment.  He reports that mom since taking the morning dose of Zyprexa his sleep has become very fragmented.  After going out in the morning and jobhunting he has been sleeping 2 to 3 hours waking up and then his sleep at night has been poor.  He reports he is also been very emotional.  [name] reports that he has been crying a lot.  Discussed starting an antidepressant.  He reports his appetite is still poor so discussed starting Remeron.  Discussed potential risks and side effects and he was agreeable to this.  Also discussed recombining his Zyprexa to a single dose at night.  He has done his intake appointment with Richardson Dopp for therapy and has his next appointment scheduled for Wednesday this week.  He will be seeing his PCP on 12/15 and confirmed that [name] still has the list of lab work to be done- CMP, CBC, A1c, Lipid Panel, and EKG. he reports no SI or HI.  When asked about AVH he reports sometimes.  He reports his sleep is okay but fragmented.  He reports his appetite is poor.  He reports having shortness of breath due to asthma and some dizziness/weakness.  He reports no other concerns at present.  He will  return for follow-up in approximately 4 weeks.    Observations/Objective:   Psychiatric Specialty Exam:   Review of Systems  Respiratory:  Positive for shortness of breath (Asthma). Negative for cough.   Cardiovascular:  Negative for chest pain.  Gastrointestinal:  Negative for abdominal pain, constipation, diarrhea, nausea and vomiting.  Neurological:  Positive for dizziness and weakness. Negative for headaches.    There were no vitals taken for this visit.There is no height or weight on file to calculate BMI.  General Appearance: Casual and Fairly Groomed  Eye Contact:  Good  Speech:  Clear and Coherent and Normal Rate  Volume:  Normal  Mood:  Dysphoric  Affect:  Congruent  Thought Process:  Coherent and Goal Directed  Orientation:  Full (Time, Place, and Person)  Thought Content:  WDL and Logical occasional AVH  Suicidal Thoughts:  No  Homicidal Thoughts:  No  Memory:  Immediate;   Good Recent;   Good  Judgement:  Good  Insight:  Good  Psychomotor Activity:  Normal  Concentration:  Concentration: Good and Attention Span: Good  Recall:  Good  Fund of Knowledge:  Good  Language:  Good  Akathisia:  Negative  Handed:  Right  AIMS (if indicated):     Assets:  Communication Skills Desire for Improvement Housing Intimacy Resilience Social Support  ADL's:  Impaired mildly  Cognition:  WNL  Sleep:   fragmented    Assessment and Plan:  Vernis Eid is  a 46 yr old male who presents via virtual video visit for follow up and medication management.  PPHx is significant for Bipolar Disorder, EtOH Abuse, Insomnia, and Nightmares.     Edvin has had difficulty these last few weeks.  His sleep has been fragmented and he has been sleeping a lot during the day due to this we will change his Zyprexa back to 15 mg at night.  Since he has been having depressive symptoms we will start Remeron to also stimulate his appetite as this has been poor.  He will have an appointment with his  PCP on 12/15 and he will obtain- CMP, CBC, A1c, Lipid, and EKG. he will return for follow-up in approximately 4 weeks.   Bipolar 1 Disorder, Depressed: -Change Zyprexa to 15 mg QHS.  90 (5 mg) tablets with 1 refill. -Start Remeron 7.5 mg QHS.  30 tablets with 1 refill.     Alcohol Use Disorder, severe, in early remission: -Continue Acamprosate 666 mg TID with meals.  180 (333 mg) tablets with 1 refill. -Continue Naltrexone 50 mg daily. 30 tablets with 1 refill.     Insomnia, unspecified type: -Continue Trazodone 300 mg QHS.  60 (150 mg) tablets with 1 refill.     Nightmares: -Continue Doxazosin 2 mg QHS. 30 tablets with 1 refill.    Follow Up Instructions:    I discussed the assessment and treatment plan with the patient. The patient was provided an opportunity to ask questions and all were answered. The patient agreed with the plan and demonstrated an understanding of the instructions.   The patient was advised to call back or seek an in-person evaluation if the symptoms worsen or if the condition fails to improve as anticipated.  I provided 20 minutes of non-face-to-face time during this encounter.   [name], MD

## 2022-09-15 ENCOUNTER — Other Ambulatory Visit: Payer: Self-pay

## 2022-09-16 ENCOUNTER — Ambulatory Visit (INDEPENDENT_AMBULATORY_CARE_PROVIDER_SITE_OTHER): Payer: Commercial Managed Care - HMO | Admitting: Licensed Clinical Social Worker

## 2022-09-16 DIAGNOSIS — F4310 Post-traumatic stress disorder, unspecified: Secondary | ICD-10-CM

## 2022-09-16 DIAGNOSIS — F319 Bipolar disorder, unspecified: Secondary | ICD-10-CM

## 2022-09-16 NOTE — Progress Notes (Signed)
   THERAPIST PROGRESS NOTE  Session Time: 30  Participation Level: Active  Behavioral Response: CasualAlertAnxious and Depressed  Type of Therapy: Individual Therapy  Treatment Goals addressed:  Decrease PHQ-9 down 10   ProgressTowards Goals: Progressing  Interventions: Motivational Interviewing and Supportive  Summary: Gregory Bishop is a 46 y.o. male who presents with depressed and anxious mood\affect.  Patient was pleasant, cooperative, maintained good eye contact.  He engaged well in therapy session was dressed casually.  Gregory Bishop was alert and oriented x 5. ` Patient comes in today with primary stressors as housing and work.  Patient reports that he has been struggling to find employment due to his alcohol Anonymous meetings being from 8 to 9 am .  LCSW attempted to use solution focused therapy to help patient find another group.  Patient reports that there is another group from 6 to 7 PM but it is only ran 3 days weekly.  LCSW advised patient that this could be an option for him if he was able to still continue alcohol Anonymous meetings while maintaining some type of employment.   Gregory Bishop reports that he still been struggling to get job offers.  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  Gregory Bishop also reports stressors for family conflict with his father who has been living in [place] and can be emotionally abusive on the phone.  Gregory Bishop reports that he has struggled to stay away from his father and is concerned that he is okay as his father has not even attempted to reach out to them.  Suicidal/Homicidal: Nowithout intent/plan  Therapist Response:     Intervention/Plan: Plan for patient is to reach out to his brother instead of his father to see if he has spoken with his father.  LCSW supportive therapy for praise and  encouragement.  LCSW use person centered therapy for empowerment.  LCSW used motivational interviewing for positive affirmations reflective listening and open-ended questions.  LCSW administered the GAD-7.  LCSW administered a PHQ-9.  LCSW reviewed scores with patient.  LCSW notes a decrease in both GAD-7 and PHQ-9.  LCSW utilized education for medication management by explaining to patient take medications as prescribed  Plan: Return again in 3 weeks.  Diagnosis: Bipolar 1 disorder, depressed (HCC)  PTSD (post-traumatic stress disorder)  Collaboration of Care: Other none today  Patient/Guardian was advised Release of Information must be obtained prior to any record release in order to collaborate their care with an outside provider. Patient/Guardian was advised if they have not already done so to contact the registration department to sign all necessary forms in order for [place] to release information regarding their care.   Consent: Patient/Guardian gives verbal consent for treatment and assignment of benefits for services provided during this visit. Patient/Guardian expressed understanding and agreed to proceed.   [name], LCSW 09/16/2022

## 2022-09-25 ENCOUNTER — Encounter: Payer: Self-pay | Admitting: Family Medicine

## 2022-09-25 ENCOUNTER — Ambulatory Visit (INDEPENDENT_AMBULATORY_CARE_PROVIDER_SITE_OTHER): Payer: Commercial Managed Care - HMO | Admitting: Family Medicine

## 2022-09-25 VITALS — BP 114/68 | HR 86 | Temp 98.6°F | Ht 72.0 in | Wt 146.0 lb

## 2022-09-25 DIAGNOSIS — F319 Bipolar disorder, unspecified: Secondary | ICD-10-CM

## 2022-09-25 DIAGNOSIS — F1021 Alcohol dependence, in remission: Secondary | ICD-10-CM | POA: Diagnosis not present

## 2022-09-25 DIAGNOSIS — F4310 Post-traumatic stress disorder, unspecified: Secondary | ICD-10-CM | POA: Diagnosis not present

## 2022-09-25 DIAGNOSIS — Z79899 Other long term (current) drug therapy: Secondary | ICD-10-CM | POA: Diagnosis not present

## 2022-09-25 NOTE — Progress Notes (Signed)
[facility] [address] [place] [address] Dept: [phone] Dept Fax: [phone]  Chronic Care Office Visit  Subjective:    Patient ID: Gregory Bishop, male    DOB: [birth year], 46 y.o..   MRN: [id number]  Chief Complaint  Patient presents with   Follow-up    6 month f/u.       History of Present Illness:  Patient is in today for reassessment of chronic medical issues.  Mr. Barge has an alcohol addiction. He notes he is maintaining his sobriety. He states he "may" have had a brief relapse this summer, but is doing well with sobriety currently. He continues to attend two virtual AA sessions a day. He is working with a counselor Ralene Ok, LCSW) and Dr. Renaldo Fiddler (psychiatry). He remains on naltrexone 50 mg daily to assist him in maintaining sobriety. He is also taking acamprosate 333 mg 2 tabs TID. He has been looking for work, but finds that employers are not willing to work around the [employer] times.   Mr. Train also has bipolar disorder and PTSD. He is now managed on mirtazipine (Remeron) 7.5 mg  daily and olanzapine (Zyprexa) 15 mg at bedtime. He takes trazodone to help with sleep. He describes a more stable home life.  Past Medical History: Patient Active Problem List   Diagnosis Date Noted   History of nightmares 01/30/2022   Nightmares 01/30/2022   Wheezing 12/23/2021   Tobacco use disorder, mild, abuse 12/23/2021   Insomnia 12/05/2021   Alcohol use disorder, severe, in sustained remission (HCC) 07/24/2021   Bipolar 1 disorder, depressed (HCC) 07/21/2021   PTSD (post-traumatic stress disorder) 07/21/2021   Dizziness 05/22/2021   Past Surgical History:  Procedure Laterality Date   LEG SURGERY Left 2014   Family History  Problem Relation Age of Onset   Diabetes Maternal Uncle    Diabetes Maternal Grandfather    Outpatient Medications Prior to Visit  Medication Sig Dispense Refill   acamprosate  (CAMPRAL) 333 MG tablet Take 2 tablets (666 mg total) by mouth 3 (three) times daily with meals. 180 tablet 1   acetaminophen (TYLENOL) 500 MG tablet Take 500-1,000 mg by mouth every 6 (six) hours as needed for mild pain or headache.     albuterol (VENTOLIN HFA) 108 (90 Base) MCG/ACT inhaler Inhale 1-2 puffs into the lungs every 4 (four) hours as needed for wheezing or shortness of breath. 6.7 g 0   cetirizine (ZYRTEC ALLERGY) 10 MG tablet Take 1 tablet (10 mg total) by mouth daily. 28 tablet 0   doxazosin (CARDURA) 2 MG tablet Take 1 tablet (2 mg total) by mouth at bedtime. 30 tablet 1   ibuprofen (ADVIL) 200 MG tablet Take 200-400 mg by mouth every 6 (six) hours as needed for mild pain or headache.     mirtazapine (REMERON) 7.5 MG tablet Take 1 tablet (7.5 mg total) by mouth at bedtime. 30 tablet 1   naltrexone (DEPADE) 50 MG tablet Take 1 tablet (50 mg total) by mouth daily. 30 tablet 1   naproxen (NAPROSYN) 500 MG tablet Take 1 tablet (500 mg total) by mouth 2 (two) times daily. 30 tablet 0   NON FORMULARY Take 1-2 tablets by mouth See admin instructions. Emergen-C Citrus-Ginger Gummies, Turmeric and Ginger, Immune Support gummies- Chew 1-2 gummies by mouth daily     OLANZapine (ZYPREXA) 15 MG tablet Take 1 tablet (15 mg total) by mouth at bedtime. 30 tablet 1   ondansetron (ZOFRAN)  4 MG tablet Take 1 tablet (4 mg total) by mouth every 8 (eight) hours as needed for nausea or vomiting. 12 tablet 0   traZODone (DESYREL) 150 MG tablet Take 2 tablets (300 mg total) by mouth at bedtime. 60 tablet 1   COVID-19 mRNA vaccine 2023-2024 (COMIRNATY) SUSP injection Inject into the muscle. 0.3 mL 0   No facility-administered medications prior to visit.   No Known Allergies    Objective:   Today's Vitals   09/25/22 1402  BP: 114/68  Pulse: 86  Temp: 98.6 F (37 C)  TempSrc: Temporal  SpO2: 95%  Weight: 146 lb (66.2 kg)  Height: 6' (1.829 m)   Body mass index is 19.8 kg/m.   General: Well  developed, well nourished. No acute distress. Psych: Alert and oriented. Normal mood and affect.  There are no preventive care reminders to display for this patient.  EKG: Normal sinus rhythm (rate= 75)   Assessment & Plan:   1. Alcohol use disorder, severe, in early remission (HCC) Maintaining sobriety. Well engaged in [place] and counseling. Continue naltrexone 50 mg daily and acamprosate 333 mg 2 tabs TID.   2. Bipolar 1 disorder, depressed (HCC) 3. PTSD (post-traumatic stress disorder) 4. High risk medication use Continue mirtazipine (Remeron) 7.5 mg  daily and olanzapine (Zyprexa) 15 mg at bedtime. Psychiatry request and EKG and screening labs related to his medication regimen. The EKG is normal. I will order the labs as requested.  - EKG 12-Lead - CBC - Comprehensive metabolic panel - Hemoglobin A1c - Lipid panel  Return in about 6 months (around 03/27/2023) for Reassessment.   Loyola Mast, MD

## 2022-09-26 LAB — CBC
HCT: 44.2 % (ref 38.5–50.0)
Hemoglobin: 15 g/dL (ref 13.2–17.1)
MCH: 31.3 pg (ref 27.0–33.0)
MCHC: 33.9 g/dL (ref 32.0–36.0)
MCV: 92.3 fL (ref 80.0–100.0)
MPV: 10.5 fL (ref 7.5–12.5)
Platelets: 142 10*3/uL (ref 140–400)
RBC: 4.79 10*6/uL (ref 4.20–5.80)
RDW: 12.1 % (ref 11.0–15.0)
WBC: 9.1 10*3/uL (ref 3.8–10.8)

## 2022-09-26 LAB — COMPREHENSIVE METABOLIC PANEL
AG Ratio: 1.6 (calc) (ref 1.0–2.5)
ALT: 13 U/L (ref 9–46)
AST: 15 U/L (ref 10–40)
Albumin: 4.4 g/dL (ref 3.6–5.1)
Alkaline phosphatase (APISO): 79 U/L (ref 36–130)
BUN: 10 mg/dL (ref 7–25)
CO2: 29 mmol/L (ref 20–32)
Calcium: 9.7 mg/dL (ref 8.6–10.3)
Chloride: 103 mmol/L (ref 98–110)
Creat: 0.98 mg/dL (ref 0.60–1.29)
Globulin: 2.7 g/dL (calc) (ref 1.9–3.7)
Glucose, Bld: 77 mg/dL (ref 65–99)
Potassium: 4.5 mmol/L (ref 3.5–5.3)
Sodium: 139 mmol/L (ref 135–146)
Total Bilirubin: 0.5 mg/dL (ref 0.2–1.2)
Total Protein: 7.1 g/dL (ref 6.1–8.1)

## 2022-09-26 LAB — HEMOGLOBIN A1C
Hgb A1c MFr Bld: 5.8 % of total Hgb — ABNORMAL HIGH (ref <5.7)
Mean Plasma Glucose: 120 mg/dL
eAG (mmol/L): 6.6 mmol/L

## 2022-09-26 LAB — LIPID PANEL
Cholesterol: 234 mg/dL — ABNORMAL HIGH (ref <200)
HDL: 59 mg/dL (ref >=40)
LDL Cholesterol (Calc): 146 mg/dL (calc) — ABNORMAL HIGH
Non-HDL Cholesterol (Calc): 175 mg/dL (calc) — ABNORMAL HIGH (ref <130)
Total CHOL/HDL Ratio: 4 (calc) (ref <5.0)
Triglycerides: 157 mg/dL — ABNORMAL HIGH (ref <150)

## 2022-10-01 ENCOUNTER — Ambulatory Visit (INDEPENDENT_AMBULATORY_CARE_PROVIDER_SITE_OTHER): Payer: Commercial Managed Care - HMO | Admitting: Licensed Clinical Social Worker

## 2022-10-01 DIAGNOSIS — F319 Bipolar disorder, unspecified: Secondary | ICD-10-CM | POA: Diagnosis not present

## 2022-10-01 DIAGNOSIS — F4310 Post-traumatic stress disorder, unspecified: Secondary | ICD-10-CM

## 2022-10-01 NOTE — Progress Notes (Signed)
   THERAPIST PROGRESS NOTE  Virtual Visit via Video Note  I connected with Gregory Bishop on 10/01/22 at  3:00 PM EST by a video enabled telemedicine application and verified that I am speaking with the correct person using two identifiers.  Location: Patient: Gregory Bishop  Provider: Providers Home    I discussed the limitations of evaluation and management by telemedicine and the availability of in person appointments. The patient expressed understanding and agreed to proceed.    I discussed the assessment and treatment plan with the patient. The patient was provided an opportunity to ask questions and all were answered. The patient agreed with the plan and demonstrated an understanding of the instructions.   The patient was advised to call back or seek an in-person evaluation if the symptoms worsen or if the condition fails to improve as anticipated.  I provided 30 minutes of non-face-to-face time during this encounter.   Gregory Cooks, LCSW   Participation Level: Active  Behavioral Response: CasualAlertAnxious and Depressed  Type of Therapy: Individual Therapy  Treatment Goals addressed: Decrease GAD-7 down by 10    ProgressTowards Goals: Not Progressing  Interventions: Motivational Interviewing and Supportive   Suicidal/Homicidal: Nowithout intent/plan  Therapist Response:     Pt was alert and oriented x 5. He was dressed casually and engaged well in therapy session. He presented with depressed, anxious and irritable mood/affect. He was pleasant and cooperative in session.   Pt reports primary stressors as family conflict and work. Pt reports that he has been working to gaining some type of employment. He has not been successful. Gregory Bishop states that he was offered an old job back but did not take it because they offered him 10 dollars less then where he ended at when he had resigned prior to go into rehab for alcohol. Pt also reports family conflict with father. Pt  states he was able to talk to his father, which he had not spoke with in several weeks. Pt reports that his stepmother daughter is trying to take the property that the stepmother and pt father own away from them. Gregory Bishop states to his dad to talk to his sister about help, but his father refused. This increased anxiety for Gregory Bishop for tension and worry.   Intervention/Plan: LCSW administered a PHQ-9. LCSW administered a GAD-7. Both increased today. Pt goal for GAD-7 is below a 5 and it increased from a 12 to a 14. Goal for PHQ-9 increase from a 15 to a 19. Plan for pt to talk with psychiatrist about increasing dosage of medications.    Plan: Return again in 3 weeks.  Diagnosis: Bipolar 1 disorder, depressed (HCC)  PTSD (post-traumatic stress disorder)  Collaboration of Bishop: Other None today   Patient/Guardian was advised Release of Information must be obtained prior to any record release in order to collaborate their Bishop with an outside provider. Patient/Guardian was advised if they have not already done so to contact the registration department to sign all necessary forms in order for [place] to release information regarding their Bishop.   Consent: Patient/Guardian gives verbal consent for treatment and assignment of benefits for services provided during this visit. Patient/Guardian expressed understanding and agreed to proceed.   Gregory Cooks, LCSW 10/01/2022

## 2022-10-19 ENCOUNTER — Telehealth (HOSPITAL_COMMUNITY): Payer: Commercial Managed Care - HMO | Admitting: Student in an Organized Health Care Education/Training Program

## 2022-10-22 ENCOUNTER — Ambulatory Visit (INDEPENDENT_AMBULATORY_CARE_PROVIDER_SITE_OTHER): Payer: Commercial Managed Care - HMO | Admitting: Licensed Clinical Social Worker

## 2022-10-22 DIAGNOSIS — F319 Bipolar disorder, unspecified: Secondary | ICD-10-CM | POA: Diagnosis not present

## 2022-10-22 DIAGNOSIS — F4310 Post-traumatic stress disorder, unspecified: Secondary | ICD-10-CM

## 2022-10-22 NOTE — Progress Notes (Signed)
   THERAPIST PROGRESS NOTE  Virtual Visit via Video Note  I connected with Gregory Bishop on 10/23/22 at  4:00 PM EST by a video enabled telemedicine application and verified that I am speaking with the correct person using two identifiers.  Location: Patient: [facility]  Provider: Provider Home    I discussed the limitations of evaluation and management by telemedicine and the availability of in person appointments. The patient expressed understanding and agreed to proceed.      I discussed the assessment and treatment plan with the patient. The patient was provided an opportunity to ask questions and all were answered. The patient agreed with the plan and demonstrated an understanding of the instructions.   The patient was advised to call back or seek an in-person evaluation if the symptoms worsen or if the condition fails to improve as anticipated.  I provided 30 minutes of non-face-to-face time during this encounter.   Gregory Horn, LCSW   Participation Level: Active  Behavioral Response: CasualAlertAnxious  Type of Therapy: Individual Therapy  Treatment Goals addressed:  Gain income either through SSDI or work.   ProgressTowards Goals: Progressing  Interventions: Supportive  Suicidal/Homicidal: Nowithout intent/plan  Therapist Response:    Pt was alert and oriented x 5. He was dressed casually and engaged well in therapy session. He presented today with anxious mood/affect. He was pleasant, cooperative and maintained good eye contact.  Pt reports primary stressor are family conflict for relationship with his father. Pt reports that he loves his father, but he is verbally abusive and an alcoholic. He makes poor choices and pt reports it can be hard to maintain a relationship with those types of problems.  Other stressors for pt are for medications mgmt. Pt reports taking his Zyprexa and passing out or fainting in the morning. Pt states that his missed his  nightly dosage and decided to take it in the morning instead. LCSW educated used solution focused therapy to educate pt about process for messaging provider of side effects. LCSW did advise pt to speak with provider first before continuing to take medication as this could be a result of a severe side effect. Pt was agreeable. LCSW also notes pt took dosage in the morning instead of at night as directed. LCSW spoke with pt about taking medications as prescribed per bottle instructions.  Interventions/Plan: LCSW educated pt on setting healthy boundaries for "knowing your limits" this is in response to stressor with his father. LCSW educated pt for medication management uses. LCSW advised pt to speak with medication provider about side effects.   Plan: Return again in 3 weeks.  Diagnosis: PTSD (post-traumatic stress disorder)  Bipolar 1 disorder, depressed ([place])  Collaboration of Care: Other none today   Patient/Guardian was advised Release of Information must be obtained prior to any record release in order to collaborate their care with an outside provider. Patient/Guardian was advised if they have not already done so to contact the registration department to sign all necessary forms in order for [place] to release information regarding their care.   Consent: Patient/Guardian gives verbal consent for treatment and assignment of benefits for services provided during this visit. Patient/Guardian expressed understanding and agreed to proceed.   Gregory Horn, LCSW 10/23/2022

## 2022-10-26 ENCOUNTER — Other Ambulatory Visit: Payer: Self-pay

## 2022-10-26 ENCOUNTER — Telehealth (INDEPENDENT_AMBULATORY_CARE_PROVIDER_SITE_OTHER): Payer: Commercial Managed Care - HMO | Admitting: Student in an Organized Health Care Education/Training Program

## 2022-10-26 DIAGNOSIS — F515 Nightmare disorder: Secondary | ICD-10-CM | POA: Diagnosis not present

## 2022-10-26 DIAGNOSIS — F319 Bipolar disorder, unspecified: Secondary | ICD-10-CM | POA: Diagnosis not present

## 2022-10-26 DIAGNOSIS — R062 Wheezing: Secondary | ICD-10-CM | POA: Diagnosis not present

## 2022-10-26 DIAGNOSIS — G4700 Insomnia, unspecified: Secondary | ICD-10-CM

## 2022-10-26 DIAGNOSIS — F1021 Alcohol dependence, in remission: Secondary | ICD-10-CM

## 2022-10-26 MED ORDER — ACAMPROSATE CALCIUM 333 MG PO TBEC
666.0000 mg | DELAYED_RELEASE_TABLET | Freq: Three times a day (TID) | ORAL | 1 refills | Status: DC
  Filled 2022-10-26: qty 180, 30d supply, fill #0
  Filled 2022-12-04: qty 180, 30d supply, fill #1

## 2022-10-26 MED ORDER — TRAZODONE HCL 150 MG PO TABS
300.0000 mg | ORAL_TABLET | Freq: Every day | ORAL | 1 refills | Status: DC
  Filled 2022-10-26: qty 60, 30d supply, fill #0
  Filled 2022-12-04 – 2023-01-21 (×3): qty 60, 30d supply, fill #1

## 2022-10-26 MED ORDER — MIRTAZAPINE 15 MG PO TABS
15.0000 mg | ORAL_TABLET | Freq: Every day | ORAL | 1 refills | Status: DC
  Filled 2022-10-26: qty 30, 30d supply, fill #0
  Filled 2022-12-04: qty 30, 30d supply, fill #1

## 2022-10-26 MED ORDER — DOXAZOSIN MESYLATE 2 MG PO TABS
2.0000 mg | ORAL_TABLET | Freq: Every day | ORAL | 1 refills | Status: DC
  Filled 2022-10-26: qty 30, 30d supply, fill #0
  Filled 2022-12-04: qty 30, 30d supply, fill #1

## 2022-10-26 MED ORDER — ALBUTEROL SULFATE HFA 108 (90 BASE) MCG/ACT IN AERS
1.0000 | INHALATION_SPRAY | RESPIRATORY_TRACT | 0 refills | Status: DC | PRN
  Filled 2022-10-26: qty 6.7, 25d supply, fill #0

## 2022-10-26 MED ORDER — NALTREXONE HCL 50 MG PO TABS
50.0000 mg | ORAL_TABLET | Freq: Every day | ORAL | 1 refills | Status: DC
  Filled 2022-10-26: qty 30, 30d supply, fill #0
  Filled 2022-12-04: qty 30, 30d supply, fill #1

## 2022-10-26 MED ORDER — OLANZAPINE 15 MG PO TABS
15.0000 mg | ORAL_TABLET | Freq: Every day | ORAL | 1 refills | Status: DC
  Filled 2022-10-26: qty 30, 30d supply, fill #0
  Filled 2022-12-04: qty 30, 30d supply, fill #1

## 2022-10-26 NOTE — Progress Notes (Signed)
BH MD/PA/NP OP Progress Note  Virtual Visit via Video Note  I connected with Gregory Bishop on 10/26/22 at  3:00 PM EST by a video enabled telemedicine application and verified that I am speaking with the correct person using two identifiers.  Location: Patient: Home Provider: [facility]   I discussed the limitations of evaluation and management by telemedicine and the availability of in person appointments. The patient expressed understanding and agreed to proceed.  10/26/2022 3:26 PM Gregory Bishop  MRN:  [id number]  Chief Complaint:  Chief Complaint  Patient presents with   Follow-up   bipolar Depressed   HPI:  Gregory Bishop is a 47 yr old male who presents via virtual video visit for follow up and medication management.  PPHx is significant for Bipolar Disorder, EtOH Abuse, Insomnia, and Nightmares.   He reports that he has been doing okay since our last appointment.  He reports that his outbursts have improved. *** Visit Diagnosis:    ICD-10-CM   1. Bipolar 1 disorder, depressed (HCC)  F31.9 mirtazapine (REMERON) 15 MG tablet    OLANZapine (ZYPREXA) 15 MG tablet    2. Insomnia, unspecified type  G47.00 mirtazapine (REMERON) 15 MG tablet    traZODone (DESYREL) 150 MG tablet    3. Nightmares  F51.5 doxazosin (CARDURA) 2 MG tablet    4. Wheezing  R06.2 albuterol (VENTOLIN HFA) 108 (90 Base) MCG/ACT inhaler    5. Alcohol use disorder, severe, in early remission (HCC)  F10.21 acamprosate (CAMPRAL) 333 MG tablet    naltrexone (DEPADE) 50 MG tablet      Past Psychiatric History: Bipolar Disorder, EtOH Abuse, Insomnia, and Nightmares.   Past Medical History:  Past Medical History:  Diagnosis Date   Stroke [facility])     Past Surgical History:  Procedure Laterality Date   LEG SURGERY Left 2014    Family Psychiatric History: Reports None  Family History:  Family History  Problem Relation Age of Onset   Diabetes Maternal Uncle    Diabetes Maternal Grandfather     Social  History:  Social History   Socioeconomic History   Marital status: Significant Other    Spouse name: Not on file   Number of children: 2   Years of education: Not on file   Highest education level: Not on file  Occupational History   Occupation: Unemployed  Tobacco Use   Smoking status: Every Day    Packs/day: 0.25    Types: Cigarettes   Smokeless tobacco: Never  Vaping Use   Vaping Use: Never used  Substance and Sexual Activity   Alcohol use: Not Currently    Alcohol/week: 6.0 standard drinks of alcohol    Types: 6 Cans of beer per week    Comment: 8 months   Drug use: No   Sexual activity: Yes    Partners: Female    Comment: 1 partner with sig other  Other Topics Concern   Not on file  Social History Narrative   Not on file   Social Determinants of Health   Financial Resource Strain: Low Risk  (07/21/2021)   Overall Financial Resource Strain (CARDIA)    Difficulty of Paying Living Expenses: Not hard at all  Food Insecurity: No Food Insecurity (07/21/2021)   Hunger Vital Sign    Worried About Running Out of Food in the Last Year: Never true    Ran Out of Food in the Last Year: Never true  Transportation Needs: No Transportation Needs (07/21/2021)   PRAPARE - Transportation  Lack of Transportation (Medical): No    Lack of Transportation (Non-Medical): No  Physical Activity: Sufficiently Active (07/21/2021)   Exercise Vital Sign    Days of Exercise per Week: 7 days    Minutes of Exercise per Session: 60 min  Stress: Stress Concern Present (07/21/2021)   [name]    Feeling of Stress : Rather much  Social Connections: Socially Isolated (07/21/2021)   Social Connection and Isolation Panel [NHANES]    Frequency of Communication with Friends and Family: Once a week    Frequency of Social Gatherings with Friends and Family: Never    Attends Religious Services: Never    Marine scientist or  Organizations: No    Attends Music therapist: Never    Marital Status: Living with partner    Allergies: No Known Allergies  Metabolic Disorder Labs: Lab Results  Component Value Date   HGBA1C 5.8 (H) 09/25/2022   MPG 120 09/25/2022   No results found for: "PROLACTIN" Lab Results  Component Value Date   CHOL 234 (H) 09/25/2022   TRIG 157 (H) 09/25/2022   HDL 59 09/25/2022   CHOLHDL 4.0 09/25/2022   VLDL 19.2 12/23/2021   LDLCALC 146 (H) 09/25/2022   LDLCALC 113 (H) 12/23/2021   Lab Results  Component Value Date   TSH 0.50 03/25/2022    Therapeutic Level Labs: No results found for: "LITHIUM" No results found for: "VALPROATE" No results found for: "CBMZ"  Current Medications: Current Outpatient Medications  Medication Sig Dispense Refill   acamprosate (CAMPRAL) 333 MG tablet Take 2 tablets (666 mg total) by mouth 3 (three) times daily with meals. 180 tablet 1   acetaminophen (TYLENOL) 500 MG tablet Take 500-1,000 mg by mouth every 6 (six) hours as needed for mild pain or headache.     albuterol (VENTOLIN HFA) 108 (90 Base) MCG/ACT inhaler Inhale 1-2 puffs into the lungs every 4 (four) hours as needed for wheezing or shortness of breath. 6.7 g 0   cetirizine (ZYRTEC ALLERGY) 10 MG tablet Take 1 tablet (10 mg total) by mouth daily. 28 tablet 0   doxazosin (CARDURA) 2 MG tablet Take 1 tablet (2 mg total) by mouth at bedtime. 30 tablet 1   ibuprofen (ADVIL) 200 MG tablet Take 200-400 mg by mouth every 6 (six) hours as needed for mild pain or headache.     mirtazapine (REMERON) 15 MG tablet Take 1 tablet (15 mg total) by mouth at bedtime. 30 tablet 1   naltrexone (DEPADE) 50 MG tablet Take 1 tablet (50 mg total) by mouth daily. 30 tablet 1   naproxen (NAPROSYN) 500 MG tablet Take 1 tablet (500 mg total) by mouth 2 (two) times daily. 30 tablet 0   NON FORMULARY Take 1-2 tablets by mouth See admin instructions. Emergen-C Citrus-Ginger Gummies, Turmeric and Ginger,  Immune Support gummies- Chew 1-2 gummies by mouth daily     OLANZapine (ZYPREXA) 15 MG tablet Take 1 tablet (15 mg total) by mouth at bedtime. 30 tablet 1   ondansetron (ZOFRAN) 4 MG tablet Take 1 tablet (4 mg total) by mouth every 8 (eight) hours as needed for nausea or vomiting. 12 tablet 0   traZODone (DESYREL) 150 MG tablet Take 2 tablets (300 mg total) by mouth at bedtime. 60 tablet 1   No current facility-administered medications for this visit.     Musculoskeletal: Strength & Muscle Tone: within normal limits Gait & Station: normal Patient leans:  N/A  Psychiatric Specialty Exam: Review of Systems  Respiratory:  Negative for cough and shortness of breath.   Cardiovascular:  Negative for chest pain.  Gastrointestinal:  Negative for abdominal pain, constipation, diarrhea, nausea and vomiting.  Neurological:  Negative for weakness and headaches.  Psychiatric/Behavioral:  Positive for behavioral problems (improving) and sleep disturbance. Negative for dysphoric mood, hallucinations and suicidal ideas. The patient is not nervous/anxious.     There were no vitals taken for this visit.There is no height or weight on file to calculate BMI.  General Appearance: Casual and Fairly Groomed  Eye Contact:  Good  Speech:  Clear and Coherent and Normal Rate  Volume:  Normal  Mood:   "ok"  Affect:  Appropriate and Congruent  Thought Process:  Coherent and Goal Directed  Orientation:  Full (Time, Place, and Person)  Thought Content: WDL and Logical   Suicidal Thoughts:  No  Homicidal Thoughts:  No  Memory:  Immediate;   Good Recent;   Good  Judgement:  Good  Insight:  Good  Psychomotor Activity:  Normal  Concentration:  Concentration: Good and Attention Span: Good  Recall:  Good  Fund of Knowledge: Good  Language: Good  Akathisia:  Negative  Handed:  Right  AIMS (if indicated): not done  Assets:  Communication Skills Desire for Improvement Housing Intimacy Resilience Social  Support  ADL's:  Intact  Cognition: WNL  Sleep:  Fair fragmented   Screenings: [place] [facility] to Hosp-Admission (Discharged) from 05/22/2021 in [facility]  CAGE-AID Score 3      GAD-7    Flowsheet Row Counselor from 10/01/2022 in [facility] Counselor from 09/16/2022 in [facility] Counselor from 08/18/2022 in [facility] Video Visit from 06/05/2022 in [facility] Video Visit from 04/03/2022 in [facility]  Total GAD-7 Score 14 11 14 16 18       PHQ2-9    Flowsheet Row Counselor from 10/01/2022 in [facility] Counselor from 09/16/2022 in [facility] Counselor from 08/18/2022 in [facility] Video Visit from 06/05/2022 in [facility] Video Visit from 04/03/2022 in [place]  PHQ-2 Total Score 5 5 3 6 5   PHQ-9 Total Score 19 15 16 23 19       Flowsheet Row Counselor from 08/18/2022 in [facility] Video Visit from 06/05/2022 in [facility] Video Visit from 04/03/2022 in [place] No Risk No Risk No Risk        Assessment and Plan:  Ygnacio Fecteau is a 47 yr old male who presents via virtual video visit for follow up and medication management.  PPHx is significant for Bipolar Disorder, EtOH Abuse, Insomnia, and Nightmares    Gregory Bishop has had improvement with the start of the Remeron with fewer outbursts.  Given this we will further increase his Remeron.  He did pass out when he accidentally took 30 mg of Zyprexa instead of 10 mg which he thought he was taking.  He reports that this only happened during those times and that with his regular dose of Zyprexa he is fine.   Encouraged him to take all medications as prescribed and check the dosing to ensure this does not happen again that he reports understanding.  We will  not make any other changes to his medication at this time.  His PCP did not write a refill for his albuterol which he needs so a refill will be sent.  He will return for follow-up in approximately 6 weeks.   Bipolar 1 Disorder, Depressed: -Continue Zyprexa to 15 mg QHS.  30 tablets with 1 refill. -Increase Remeron to 15 mg QHS.  30 tablets with 1 refill.     Alcohol Use Disorder, severe, in early remission: -Continue Acamprosate 666 mg TID with meals.  180 (333 mg) tablets with 1 refill. -Continue Naltrexone 50 mg daily. 30 tablets with 1 refill.     Insomnia, unspecified type: -Continue Trazodone 300 mg QHS.  60 (150 mg) tablets with 1 refill.     Nightmares: -Continue Doxazosin 2 mg QHS. 30 tablets with 1 refill.   -Albuterol refill sent.    Collaboration of Care: Collaboration of Care: Other provider involved in patient's care [facility] Therapist  Patient/Guardian was advised Release of Information must be obtained prior to any record release in order to collaborate their care with an outside provider. Patient/Guardian was advised if they have not already done so to contact the registration department to sign all necessary forms in order for [place] to release information regarding their care.   Consent: Patient/Guardian gives verbal consent for treatment and assignment of benefits for services provided during this visit. Patient/Guardian expressed understanding and agreed to proceed.    Gregory Franklin, MD 10/26/2022, 3:26 PM   Follow Up Instructions:    I discussed the assessment and treatment plan with the patient. The patient was provided an opportunity to ask questions and all were answered. The patient agreed with the plan and demonstrated an understanding of the instructions.   The patient was advised to call back or  seek an in-person evaluation if the symptoms worsen or if the condition fails to improve as anticipated.  I provided 22 minutes of non-face-to-face time during this encounter.   Gregory Franklin, MD

## 2022-10-27 ENCOUNTER — Other Ambulatory Visit: Payer: Self-pay

## 2022-10-27 ENCOUNTER — Encounter (HOSPITAL_COMMUNITY): Payer: Self-pay | Admitting: Student in an Organized Health Care Education/Training Program

## 2022-10-29 ENCOUNTER — Other Ambulatory Visit: Payer: Self-pay

## 2022-11-18 ENCOUNTER — Ambulatory Visit (INDEPENDENT_AMBULATORY_CARE_PROVIDER_SITE_OTHER): Payer: Commercial Managed Care - HMO | Admitting: Licensed Clinical Social Worker

## 2022-11-18 DIAGNOSIS — F319 Bipolar disorder, unspecified: Secondary | ICD-10-CM | POA: Diagnosis not present

## 2022-11-18 NOTE — Progress Notes (Signed)
THERAPIST PROGRESS NOTE  Virtual Visit via Video Note  I connected with [name] on 11/18/22 at  2:00 PM EST by a video enabled telemedicine application and verified that I am speaking with the correct person using two identifiers.  Location: Patient: Gregory Bishop  Provider: Providers Home    I discussed the limitations of evaluation and management by telemedicine and the availability of in person appointments. The patient expressed understanding and agreed to proceed.   I discussed the assessment and treatment plan with the patient. The patient was provided an opportunity to ask questions and all were answered. The patient agreed with the plan and demonstrated an understanding of the instructions.   The patient was advised to call back or seek an in-person evaluation if the symptoms worsen or if the condition fails to improve as anticipated.  I provided 45 minutes of non-face-to-face time during this encounter.   Dory Horn, LCSW   Participation Level: Active  Behavioral Response: CasualAlertAnxious and Depressed  Type of Therapy: Individual Therapy  Treatment Goals addressed:  Active     PTSD-Trauma Disorder CCP Problem  1 PTSD       Decrease PHQ-9 down 10  (Progressing)     Start:  08/18/22    Expected End:  06/11/23         Decrease GAD-7 down by 10  (Progressing)     Start:  08/18/22    Expected End:  06/11/23         Gain income either through [place] or work.  (Not Progressing)     Start:  08/18/22    Expected End:  06/11/23            ProgressTowards Goals: Progressing  Interventions: CBT, Motivational Interviewing, and Supportive   Suicidal/Homicidal: Nowithout intent/plan  Therapist Response:    Pt was alert and oriented x 5. He was dressed casually and engaged well in therapy session. He presented today with depressed and anxious mood/affect. He was pleasant, cooperative and maintained good eye contact.    Pt reports stressor today  as trauma and work. Pt states that he has not been able to find employment due to the fact he needs to attend AA meeting, therapy and medication mgnt. He also reports the medications make him tired. Pt reports other stressors as trauma. He reports neglect from his mother. Izyk states she prioritized alcohol and partying over her three children. Pt states things have improved due to the fact she has a stable relationship and does not drink/party like she uses too. Ash states his mother talked to sister about her regrets and made amends with her children.   Interventions/Plan: LCSW administered a PHQ-9. LCSW administered a GAD-7. LCSW notes a decrease towards goals for both. LCSW spoke with pt about triggers for trauma such as alcohol. LCSW notes no progress towards income as he has not been able to find employment or hear from [place]: Return again in 3 weeks.  Diagnosis: No diagnosis found.  Collaboration of Care: Other none today   Patient/Guardian was advised Release of Information must be obtained prior to any record release in order to collaborate their care with an outside provider. Patient/Guardian was advised if they have not already done so to contact the registration department to sign all necessary forms in order for [place] to release information regarding their care.   Consent: Patient/Guardian gives verbal consent for treatment and assignment of benefits for services provided during this visit. Patient/Guardian expressed  understanding and agreed to proceed.   Dory Horn, LCSW 11/18/2022

## 2022-12-07 ENCOUNTER — Other Ambulatory Visit: Payer: Self-pay

## 2022-12-07 IMAGING — DX DG CHEST 1V PORT
1 series · 2 of 2 positions shown · non-contrast
Comparison: 01/13/2020

CLINICAL DATA: Shortness of breath and cough

EXAM:
PORTABLE CHEST 1 VIEW

[Series 1: chest ap · 0.14mm/px · 2 of 2 slices shown]
[im 1/2]
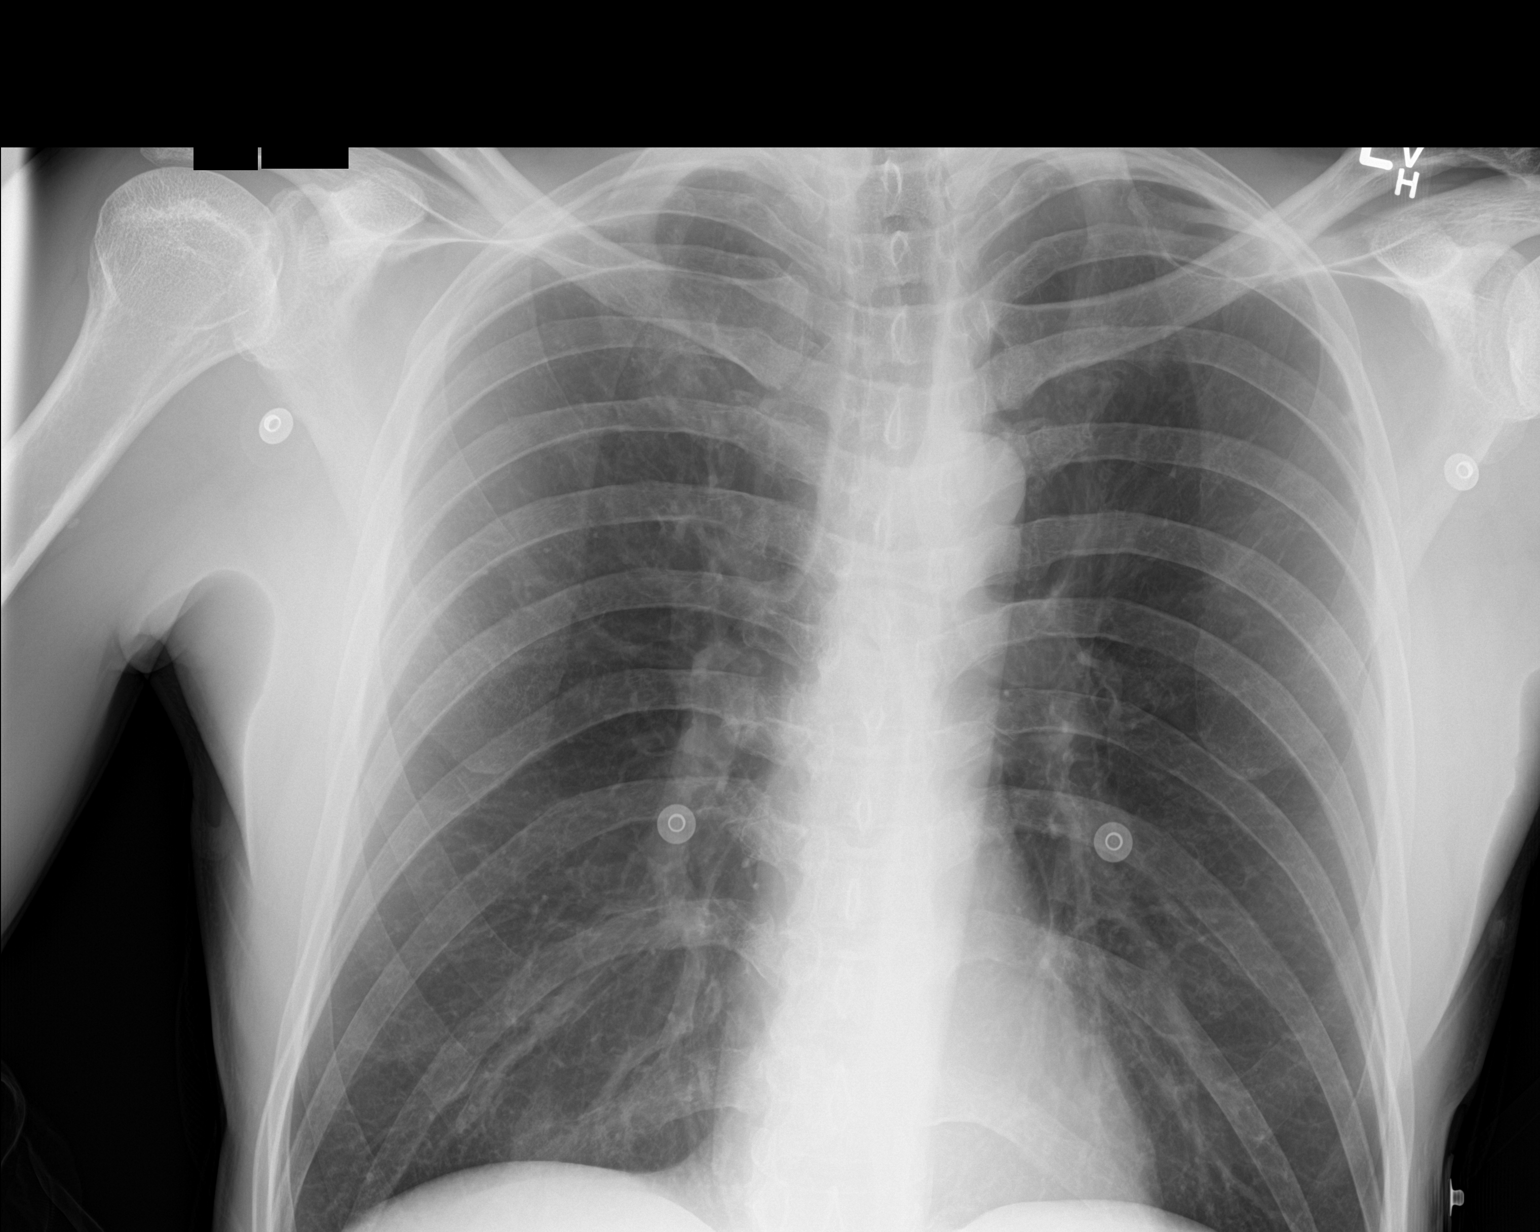
[im 2/2]
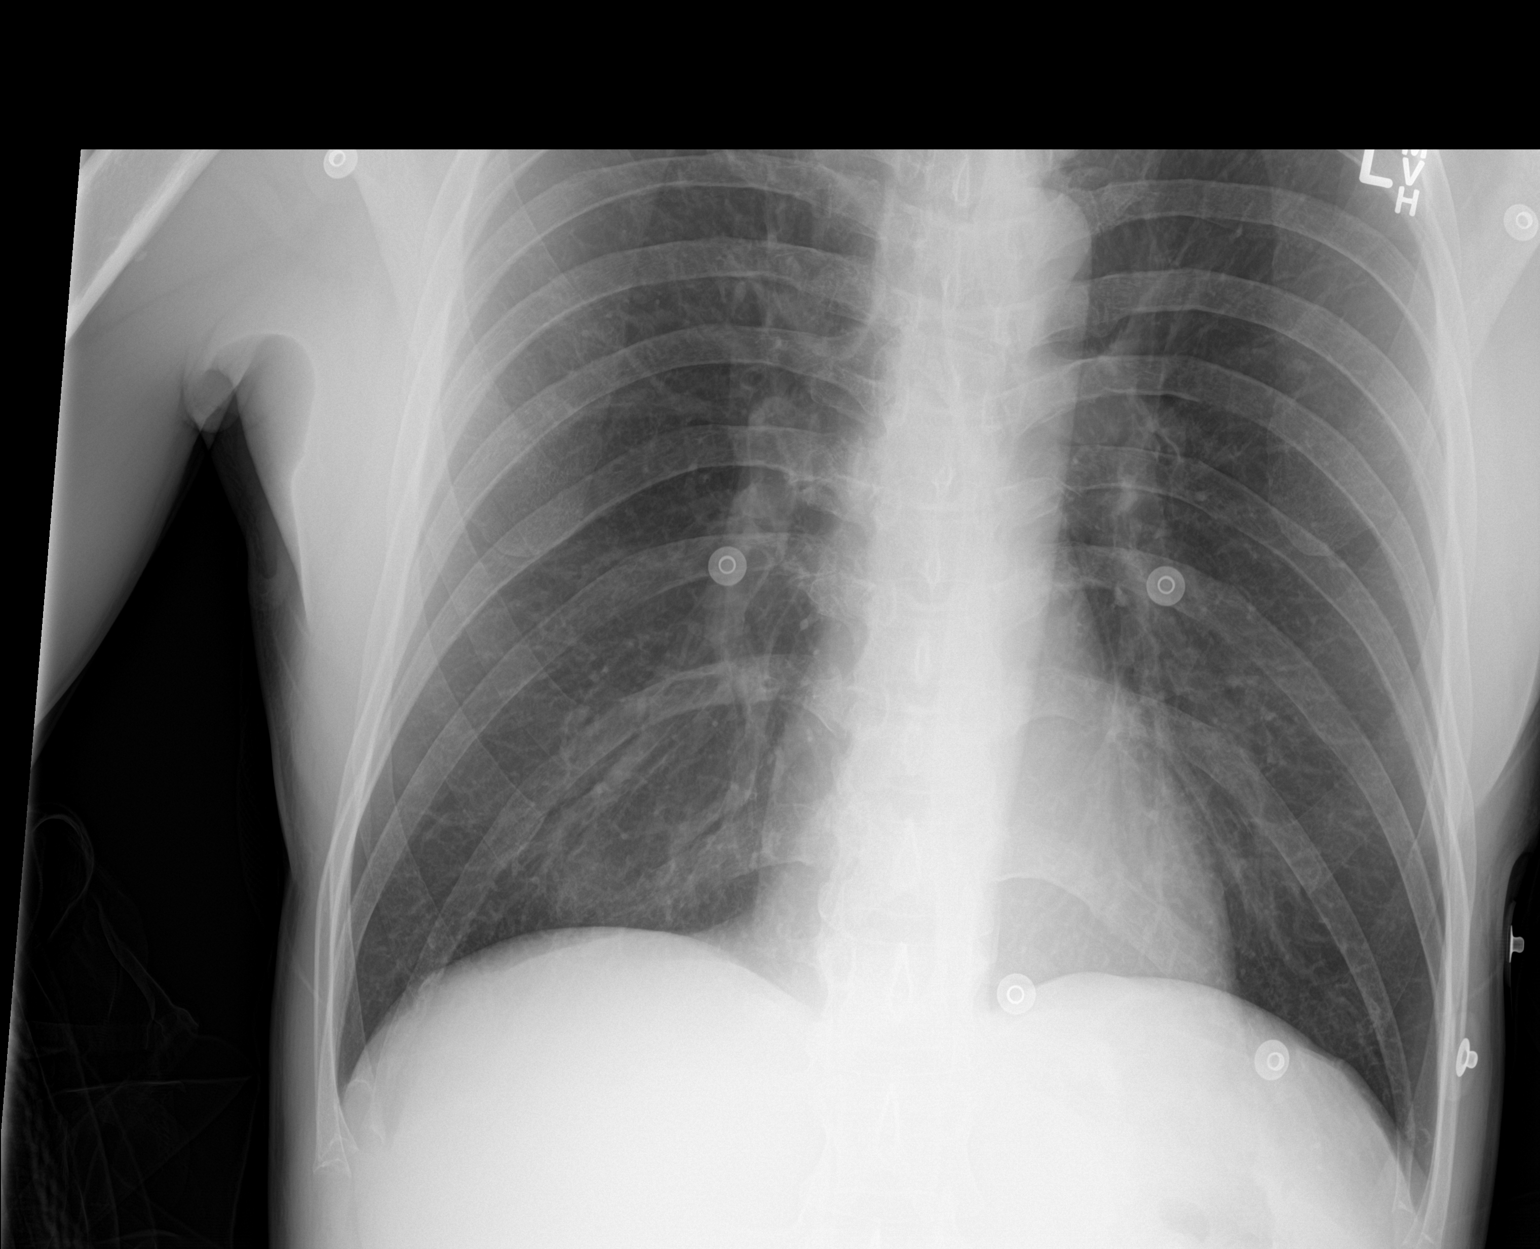

[2 of 2 positions shown; findings below may reference images not displayed]

FINDINGS: Cardiac shadow is stable. The lungs are mildly hyperinflated but
clear. No bony abnormality is seen.
IMPRESSION: No acute abnormality noted.

## 2022-12-08 ENCOUNTER — Other Ambulatory Visit: Payer: Self-pay

## 2022-12-08 ENCOUNTER — Ambulatory Visit (INDEPENDENT_AMBULATORY_CARE_PROVIDER_SITE_OTHER): Payer: Commercial Managed Care - HMO | Admitting: Licensed Clinical Social Worker

## 2022-12-08 DIAGNOSIS — F4310 Post-traumatic stress disorder, unspecified: Secondary | ICD-10-CM

## 2022-12-08 DIAGNOSIS — F319 Bipolar disorder, unspecified: Secondary | ICD-10-CM | POA: Diagnosis not present

## 2022-12-08 NOTE — Progress Notes (Signed)
   THERAPIST PROGRESS NOTE  Virtual Visit via Video Note  I connected with Gregory Bishop on 12/08/22 at  2:00 PM EST by a video enabled telemedicine application and verified that I am speaking with the correct person using two identifiers.  Location: Patient: [facility]  Provider: Providers Home    I discussed the limitations of evaluation and management by telemedicine and the availability of in person appointments. The patient expressed understanding and agreed to proceed.    I discussed the assessment and treatment plan with the patient. The patient was provided an opportunity to ask questions and all were answered. The patient agreed with the plan and demonstrated an understanding of the instructions.   The patient was advised to call back or seek an in-person evaluation if the symptoms worsen or if the condition fails to improve as anticipated.  I provided 30 minutes of non-face-to-face time during this encounter.   Dory Horn, LCSW   Participation Level: Active  Behavioral Response: CasualAlertAnxious and Depressed  Type of Therapy: Individual Therapy  Treatment Goals addressed:   ProgressTowards Goals: Progressing  Interventions: CBT, Motivational Interviewing, and Supportive   Suicidal/Homicidal: Nowithout intent/plan  Therapist Response:    Gregory Bishop was alert and oriented x 5. He was dressed casually and engaged well in therapy session. Pt was pleasant, cooperative, and maintained good eye contact. He presented with depressed and anxious mood/affect.   Pt reports that things have been going well for him. LCSW administered a PHQ-9. LCSW administered a GAD-7. PHQ-9 decreased by 2 points and GAD-7 decreased by 5 points. Pt reports that he has been feeling good after a visit from his daughter and mother. Pt reports that this decreased depressive symptoms. LCSW used supportive therapy for praise and encouragement. LCSW used CBT therapy for reframing. LCSW  educated pt on taking medications as prescribed.    Plan: Return again in 3 weeks.  Diagnosis: Bipolar 1 disorder, depressed (HCC)  PTSD (post-traumatic stress disorder)  Collaboration of Care: Other None today   Patient/Guardian was advised Release of Information must be obtained prior to any record release in order to collaborate their care with an outside provider. Patient/Guardian was advised if they have not already done so to contact the registration department to sign all necessary forms in order for [place] to release information regarding their care.   Consent: Patient/Guardian gives verbal consent for treatment and assignment of benefits for services provided during this visit. Patient/Guardian expressed understanding and agreed to proceed.   Dory Horn, LCSW 12/08/2022

## 2022-12-11 ENCOUNTER — Other Ambulatory Visit: Payer: Self-pay

## 2022-12-11 ENCOUNTER — Encounter (HOSPITAL_COMMUNITY): Payer: Self-pay | Admitting: Student in an Organized Health Care Education/Training Program

## 2022-12-11 ENCOUNTER — Telehealth (INDEPENDENT_AMBULATORY_CARE_PROVIDER_SITE_OTHER): Payer: Commercial Managed Care - HMO | Admitting: Student in an Organized Health Care Education/Training Program

## 2022-12-11 ENCOUNTER — Telehealth (HOSPITAL_COMMUNITY): Payer: Commercial Managed Care - HMO | Admitting: Student in an Organized Health Care Education/Training Program

## 2022-12-11 DIAGNOSIS — F319 Bipolar disorder, unspecified: Secondary | ICD-10-CM | POA: Diagnosis not present

## 2022-12-11 DIAGNOSIS — F4310 Post-traumatic stress disorder, unspecified: Secondary | ICD-10-CM

## 2022-12-11 DIAGNOSIS — G4700 Insomnia, unspecified: Secondary | ICD-10-CM | POA: Diagnosis not present

## 2022-12-11 DIAGNOSIS — F515 Nightmare disorder: Secondary | ICD-10-CM | POA: Diagnosis not present

## 2022-12-11 DIAGNOSIS — R062 Wheezing: Secondary | ICD-10-CM

## 2022-12-11 DIAGNOSIS — F1021 Alcohol dependence, in remission: Secondary | ICD-10-CM

## 2022-12-11 MED ORDER — MIRTAZAPINE 15 MG PO TABS
15.0000 mg | ORAL_TABLET | Freq: Every day | ORAL | 1 refills | Status: DC
  Filled 2023-01-11 – 2023-01-21 (×2): qty 30, 30d supply, fill #0

## 2022-12-11 MED ORDER — ACAMPROSATE CALCIUM 333 MG PO TBEC
666.0000 mg | DELAYED_RELEASE_TABLET | Freq: Three times a day (TID) | ORAL | 1 refills | Status: DC
  Filled 2023-01-11 – 2023-01-21 (×2): qty 180, 30d supply, fill #0

## 2022-12-11 MED ORDER — DOXAZOSIN MESYLATE 2 MG PO TABS: 2.0000 mg | ORAL_TABLET | Freq: Every day | ORAL | 1 refills | Status: DC

## 2022-12-11 MED ORDER — OLANZAPINE 10 MG PO TABS
10.0000 mg | ORAL_TABLET | Freq: Every day | ORAL | 1 refills | Status: DC
  Filled 2022-12-11 – 2023-01-11 (×2): qty 30, 30d supply, fill #0

## 2022-12-11 MED ORDER — OXCARBAZEPINE 150 MG PO TABS
150.0000 mg | ORAL_TABLET | Freq: Two times a day (BID) | ORAL | 1 refills | Status: DC
  Filled 2022-12-11 – 2023-01-21 (×3): qty 60, 30d supply, fill #0

## 2022-12-11 MED ORDER — ALBUTEROL SULFATE HFA 108 (90 BASE) MCG/ACT IN AERS
1.0000 | INHALATION_SPRAY | RESPIRATORY_TRACT | 0 refills | Status: DC | PRN
  Filled 2022-12-11: qty 6.7, 25d supply, fill #0
  Filled 2023-01-21: qty 6.7, 17d supply, fill #0

## 2022-12-11 MED ORDER — NALTREXONE HCL 50 MG PO TABS
50.0000 mg | ORAL_TABLET | Freq: Every day | ORAL | 1 refills | Status: DC
  Filled 2023-01-11 – 2023-01-21 (×2): qty 30, 30d supply, fill #0

## 2022-12-11 NOTE — Progress Notes (Signed)
BH MD/PA/NP OP Progress Note  Virtual Visit via Video Note  I connected with Gregory Bishop on 12/11/22 at  3:00 PM EST by a video enabled telemedicine application and verified that I am speaking with the correct person using two identifiers.  Location: Patient: Home Provider: [facility]   I discussed the limitations of evaluation and management by telemedicine and the availability of in person appointments. The patient expressed understanding and agreed to proceed.   12/11/2022 3:36 PM Gregory Bishop  MRN:  [id number]  Chief Complaint:  Chief Complaint  Patient presents with   Follow-up   Bipolar Depressed   HPI:  Gregory Bishop is a 47 yr old male who presents via Virtual Video Visit for Follow Up and Medication Management.  PPHx is significant for Bipolar Disorder, EtOH Abuse, Insomnia, and Nightmares.   He presents with his girlfriend show.  He reports that he is doing okay but she reports he is still having issues with mood stability and still getting into arguments frequently.  She reports he is also very sedated by his medications.  She reports that he will sleep 10+ hours a night and have trouble still staying awake during the day.  Discussed with them that given these issues with sleep we would decrease his Zyprexa at this time.  Discussed that since his lab work was reassuring when it was done in December we will start Trileptal for further mood stability without hopefully adding more sedation.  Discussed if this continues to be an issue we may adjust his Remeron at follow-up.  They were both agreeable with this and had no concerns.  He reports no SI, HI, or AVH.  He reports his sleep is too good.  He reports his appetite is fair.  He reports some dizziness in the mornings and some chronic shortness of breath from asthma but otherwise reports no other concerns at present.  He will return for follow-up approximately 6 weeks.   Visit Diagnosis:    ICD-10-CM   1. Bipolar 1 disorder,  depressed (HCC)  F31.9 OXcarbazepine (TRILEPTAL) 150 MG tablet    mirtazapine (REMERON) 15 MG tablet    OLANZapine (ZYPREXA) 10 MG tablet    2. PTSD (post-traumatic stress disorder)  F43.10     3. Insomnia, unspecified type  G47.00 mirtazapine (REMERON) 15 MG tablet    4. Nightmares  F51.5 doxazosin (CARDURA) 2 MG tablet    5. Alcohol use disorder, severe, in early remission ([place])  F10.21 acamprosate (CAMPRAL) 333 MG tablet    naltrexone (DEPADE) 50 MG tablet    6. Wheezing  R06.2 albuterol (VENTOLIN HFA) 108 (90 Base) MCG/ACT inhaler      Past Psychiatric History: Bipolar Disorder, EtOH Abuse, Insomnia, and Nightmares.    Past Medical History:  Past Medical History:  Diagnosis Date   Stroke [facility])     Past Surgical History:  Procedure Laterality Date   LEG SURGERY Left 2014    Family Psychiatric History: Reports None   Family History:  Family History  Problem Relation Age of Onset   Diabetes Maternal Uncle    Diabetes Maternal Grandfather     Social History:  Social History   Socioeconomic History   Marital status: Significant Other    Spouse name: Not on file   Number of children: 2   Years of education: Not on file   Highest education level: Not on file  Occupational History   Occupation: Unemployed  Tobacco Use   Smoking status: Every Day  Packs/day: 0.25    Types: Cigarettes   Smokeless tobacco: Never  Vaping Use   Vaping Use: Never used  Substance and Sexual Activity   Alcohol use: Not Currently    Alcohol/week: 6.0 standard drinks of alcohol    Types: 6 Cans of beer per week    Comment: 8 months   Drug use: No   Sexual activity: Yes    Partners: Female    Comment: 1 partner with sig other  Other Topics Concern   Not on file  Social History Narrative   Not on file   Social Determinants of Health   Financial Resource Strain: Low Risk  (07/21/2021)   Overall Financial Resource Strain (CARDIA)    Difficulty of Paying Living Expenses: Not  hard at all  Food Insecurity: No Food Insecurity (07/21/2021)   Hunger Vital Sign    Worried About Running Out of Food in the Last Year: Never true    Ran Out of Food in the Last Year: Never true  Transportation Needs: No Transportation Needs (07/21/2021)   PRAPARE - Hydrologist (Medical): No    Lack of Transportation (Non-Medical): No  Physical Activity: Sufficiently Active (07/21/2021)   Exercise Vital Sign    Days of Exercise per Week: 7 days    Minutes of Exercise per Session: 60 min  Stress: Stress Concern Present (07/21/2021)   [place]    Feeling of Stress : Rather much  Social Connections: Socially Isolated (07/21/2021)   Social Connection and Isolation Panel [NHANES]    Frequency of Communication with Friends and Family: Once a week    Frequency of Social Gatherings with Friends and Family: Never    Attends Religious Services: Never    Marine scientist or Organizations: No    Attends Music therapist: Never    Marital Status: Living with partner    Allergies: No Known Allergies  Metabolic Disorder Labs: Lab Results  Component Value Date   HGBA1C 5.8 (H) 09/25/2022   MPG 120 09/25/2022   No results found for: "PROLACTIN" Lab Results  Component Value Date   CHOL 234 (H) 09/25/2022   TRIG 157 (H) 09/25/2022   HDL 59 09/25/2022   CHOLHDL 4.0 09/25/2022   VLDL 19.2 12/23/2021   LDLCALC 146 (H) 09/25/2022   LDLCALC 113 (H) 12/23/2021   Lab Results  Component Value Date   TSH 0.50 03/25/2022    Therapeutic Level Labs: No results found for: "LITHIUM" No results found for: "VALPROATE" No results found for: "CBMZ"  Current Medications: Current Outpatient Medications  Medication Sig Dispense Refill   OXcarbazepine (TRILEPTAL) 150 MG tablet Take 1 tablet (150 mg total) by mouth 2 (two) times daily. 60 tablet 1   acamprosate (CAMPRAL) 333 MG tablet  Take 2 tablets (666 mg total) by mouth 3 (three) times daily with meals. 180 tablet 1   acetaminophen (TYLENOL) 500 MG tablet Take 500-1,000 mg by mouth every 6 (six) hours as needed for mild pain or headache.     albuterol (VENTOLIN HFA) 108 (90 Base) MCG/ACT inhaler Inhale 1-2 puffs into the lungs every 4 (four) hours as needed for wheezing or shortness of breath. 6.7 g 0   cetirizine (ZYRTEC ALLERGY) 10 MG tablet Take 1 tablet (10 mg total) by mouth daily. 28 tablet 0   doxazosin (CARDURA) 2 MG tablet Take 1 tablet (2 mg total) by mouth at bedtime. 30 tablet 1  ibuprofen (ADVIL) 200 MG tablet Take 200-400 mg by mouth every 6 (six) hours as needed for mild pain or headache.     mirtazapine (REMERON) 15 MG tablet Take 1 tablet (15 mg total) by mouth at bedtime. 30 tablet 1   naltrexone (DEPADE) 50 MG tablet Take 1 tablet (50 mg total) by mouth daily. 30 tablet 1   naproxen (NAPROSYN) 500 MG tablet Take 1 tablet (500 mg total) by mouth 2 (two) times daily. 30 tablet 0   NON FORMULARY Take 1-2 tablets by mouth See admin instructions. Emergen-C Citrus-Ginger Gummies, Turmeric and Ginger, Immune Support gummies- Chew 1-2 gummies by mouth daily     OLANZapine (ZYPREXA) 10 MG tablet Take 1 tablet (10 mg total) by mouth at bedtime. 30 tablet 1   ondansetron (ZOFRAN) 4 MG tablet Take 1 tablet (4 mg total) by mouth every 8 (eight) hours as needed for nausea or vomiting. 12 tablet 0   traZODone (DESYREL) 150 MG tablet Take 2 tablets (300 mg total) by mouth at bedtime. 60 tablet 1   No current facility-administered medications for this visit.     Musculoskeletal: Strength & Muscle Tone: within normal limits Gait & Station:  Sitting During Interview Patient leans: N/A  Psychiatric Specialty Exam: Review of Systems  Respiratory:  Positive for shortness of breath (Asthma).   Cardiovascular:  Negative for chest pain.  Gastrointestinal:  Negative for abdominal pain, constipation, diarrhea, nausea and  vomiting.  Neurological:  Positive for dizziness (in the mornings). Negative for weakness and headaches.  Psychiatric/Behavioral:  Positive for dysphoric mood. Negative for sleep disturbance. The patient is not nervous/anxious.     There were no vitals taken for this visit.There is no height or weight on file to calculate BMI.  General Appearance: Casual and Fairly Groomed  Eye Contact:  Good  Speech:  Clear and Coherent and Normal Rate  Volume:  Normal  Mood:  Dysphoric  Affect:  Congruent  Thought Process:  Coherent and Goal Directed  Orientation:  Full (Time, Place, and Person)  Thought Content: WDL and Logical Chronic AVH non distressing  Suicidal Thoughts:  No  Homicidal Thoughts:  No  Memory:  Immediate;   Fair Recent;   Fair  Judgement:  Fair  Insight:  Fair  Psychomotor Activity:  Normal  Concentration:  Concentration: Good and Attention Span: Good  Recall:  Good  Fund of Knowledge: Good  Language: Good  Akathisia:  Negative  Handed:  Right  AIMS (if indicated): not done  Assets:  Communication Skills Desire for Improvement Housing Intimacy Resilience Social Support  ADL's:  Intact  Cognition: WNL  Sleep:  Good   Screenings: CAGE-AID    [facility] to Hosp-Admission (Discharged) from 05/22/2021 in [facility]  CAGE-AID Score 3      GAD-7    Flowsheet Row Counselor from 12/08/2022 in [facility] Counselor from 11/18/2022 in [facility] Counselor from 10/01/2022 in [facility] Counselor from 09/16/2022 in [facility] Counselor from 08/18/2022 in [facility]  Total GAD-7 Score '7 12 14 11 14      '$ PHQ2-9    Flowsheet Row Counselor from 12/08/2022 in [facility] Counselor from 11/18/2022 in [facility] Counselor from 10/01/2022 in [facility] Counselor from 09/16/2022 in [facility] Counselor from 08/18/2022 in [facility]  PHQ-2 Total Score '2 3 5 5 3  '$ PHQ-9 Total Score '10 12 19 15 16      '$ Flowsheet Row Counselor from 08/18/2022 in [facility] Video Visit from 06/05/2022 in [facility] Video Visit from 04/03/2022 in [place] No Risk No Risk No Risk        Assessment and Plan:  Gregory Bishop is a 47 yr old male who presents via Virtual Video Visit for Follow Up and Medication Management.  PPHx is significant for Bipolar Disorder, EtOH Abuse, Insomnia, and Nightmares.     Gregory Bishop continues to have issues with mood stability.  He is also too sedated on his medication regiment having trouble staying awake during the day.  We will reduce his Zyprexa at this time to reduce the daytime sedation.  To better stabilize his mood we will trial Trileptal at this time (labs drawn 09/2022 show Na: 139, AST: 15, ALT:13).  He has not been taking the trazodone so we will pause that for right now.  If he continues to be overly sedated we may consider changes to Remeron at follow-up.  We will not make any other changes to his medication at this time.  He will return for follow-up in approximately 6 weeks.   Bipolar 1 Disorder, Depressed: -Decrease Zyprexa to 10 mg QHS.  30 tablets with 1 refill. -Continue Remeron 15 mg QHS.  30 tablets with 1 refill. -Start Trileptal 150 mg BID for mood stability.  60 tablets with 1 refill.     Alcohol Use Disorder, severe, in early remission: -Continue Acamprosate 666 mg TID with meals.  180 (333 mg) tablets with 1 refill. -Continue Naltrexone 50 mg daily. 30 tablets with 1 refill.     Insomnia, unspecified type: -Pause Trazodone 300 mg QHS PRN.  He has not been taking the Trazodone     Nightmares: -Continue Doxazosin  2 mg QHS. 30 tablets with 1 refill.   -Albuterol refill sent.     Collaboration of Care: Collaboration of Care: Other provider involved in patient's care AEB [facility] Therapist  Patient/Guardian was advised Release of Information must be obtained prior to any record release in order to collaborate their care with an outside provider. Patient/Guardian was advised if they have not already done so to contact the registration department to sign all necessary forms in order for [place] to release information regarding their care.   Consent: Patient/Guardian gives verbal consent for treatment and assignment of benefits for services provided during this visit. Patient/Guardian expressed understanding and agreed to proceed.    Briant Cedar, MD 12/11/2022, 3:36 PM   Follow Up Instructions:    I discussed the assessment and treatment plan with the patient. The patient was provided an opportunity to ask questions and all were answered. The patient agreed with the plan and demonstrated an understanding of the instructions.   The patient was advised to call back or seek an in-person evaluation if the symptoms worsen or if the condition fails to improve as anticipated.  I provided 26 minutes of non-face-to-face time during this encounter.   Briant Cedar, MD

## 2022-12-14 ENCOUNTER — Other Ambulatory Visit: Payer: Self-pay

## 2022-12-15 ENCOUNTER — Other Ambulatory Visit: Payer: Self-pay

## 2022-12-16 ENCOUNTER — Other Ambulatory Visit: Payer: Self-pay

## 2022-12-21 ENCOUNTER — Other Ambulatory Visit: Payer: Self-pay

## 2022-12-29 ENCOUNTER — Ambulatory Visit (INDEPENDENT_AMBULATORY_CARE_PROVIDER_SITE_OTHER): Payer: Commercial Managed Care - HMO | Admitting: Licensed Clinical Social Worker

## 2022-12-29 DIAGNOSIS — F319 Bipolar disorder, unspecified: Secondary | ICD-10-CM

## 2022-12-29 DIAGNOSIS — F4310 Post-traumatic stress disorder, unspecified: Secondary | ICD-10-CM | POA: Diagnosis not present

## 2022-12-29 NOTE — Progress Notes (Signed)
THERAPIST PROGRESS NOTE Virtual Visit via Video Note I connected with Gregory Bishop on 12/29/22 at  1:00 PM EDT by a video enabled telemedicine application and verified that I am speaking with the correct person using two identifiers. Location: Patient: [name]  Provider: Providers Home    I discussed the limitations of evaluation and management by telemedicine and the availability of in person appointments. The patient expressed understanding and agreed to proceed.   I discussed the assessment and treatment plan with the patient. The patient was provided an opportunity to ask questions and all were answered. The patient agreed with the plan and demonstrated an understanding of the instructions.   The patient was advised to call back or seek an in-person evaluation if the symptoms worsen or if the condition fails to improve as anticipated.  I provided 30 minutes of non-face-to-face time during this encounter.   Dory Horn, LCSW   Participation Level: Active  Behavioral Response: CasualAlertAnxious and Depressed  Type of Therapy: Individual Therapy  Treatment Goals addressed:  Active     PTSD-Trauma Disorder CCP Problem  1 PTSD       Decrease PHQ-9 down 10  (Progressing)     Start:  08/18/22    Expected End:  06/11/23         Decrease GAD-7 down by 10  (Progressing)     Start:  08/18/22    Expected End:  06/11/23         Gain income either through [place] or work.  (Progressing)     Start:  08/18/22    Expected End:  06/11/23       Goal Note     Pt continues to apply for jobs to will work with his mental health and recovery services             ProgressTowards Goals: Progressing  Interventions: CBT, Motivational Interviewing, and Supportive   Suicidal/Homicidal: Nowithout intent/plan  Therapist Response:      Pt was alert and oriented x 5. He was pleasant, cooperative and maintained good eye contact. He engaged well in therapy session and  was dressed casually. Gregory Bishop presented with anxious mood/affect.   Pt reports insomnia has improved with medications. When LCSW asked about his mood stabilization he called for his roommate to answer because he states, "I feel good". Roommate Gregory Bishop reports that he is moody and irritable, and she did ask If there was supposed to be more of change with his medications. LCSW explained one thing that could be explored is a DNA test that helps show drugs he is resistant too. This would have to be asked to the provider to order. Pt and roommate were agreeable to asking. LCSW and pt spoke about accountability today. LCSW asked pt to start to track symptoms and environments where he feels irritable, anger, or has a mood swing. Pt agreeable to start attempting to track symptoms. He states, "No Promises but I will try my best".   Intervention/Plan: LCSW and pt spoke about plan to order DNA drug testing and discuss this option with the provider to better understands medications he is resistant too. LCSW also spoke with pt about tracking symptoms using journal. LCSW educated pt on symptoms for bipolar disorder. LCSW educated pt on taking medications as prescribed.   Plan: Return again in 3 weeks.  Diagnosis: PTSD (post-traumatic stress disorder)  Bipolar 1 disorder, depressed (Gregory Bishop)  Collaboration of Care: Other None today   Patient/Guardian was advised Release of Information  must be obtained prior to any record release in order to collaborate their care with an outside provider. Patient/Guardian was advised if they have not already done so to contact the registration department to sign all necessary forms in order for [place] to release information regarding their care.   Consent: Patient/Guardian gives verbal consent for treatment and assignment of benefits for services provided during this visit. Patient/Guardian expressed understanding and agreed to proceed.   Dory Horn, LCSW 12/29/2022

## 2023-01-11 ENCOUNTER — Other Ambulatory Visit: Payer: Self-pay

## 2023-01-14 ENCOUNTER — Encounter: Payer: Self-pay | Admitting: Pharmacist

## 2023-01-14 ENCOUNTER — Other Ambulatory Visit: Payer: Self-pay

## 2023-01-15 ENCOUNTER — Other Ambulatory Visit: Payer: Self-pay

## 2023-01-18 ENCOUNTER — Other Ambulatory Visit: Payer: Self-pay

## 2023-01-21 ENCOUNTER — Ambulatory Visit (HOSPITAL_COMMUNITY): Payer: Medicaid Other | Admitting: Licensed Clinical Social Worker

## 2023-01-21 ENCOUNTER — Other Ambulatory Visit: Payer: Self-pay

## 2023-01-21 DIAGNOSIS — F319 Bipolar disorder, unspecified: Secondary | ICD-10-CM | POA: Diagnosis not present

## 2023-01-21 DIAGNOSIS — F4310 Post-traumatic stress disorder, unspecified: Secondary | ICD-10-CM

## 2023-01-21 DIAGNOSIS — Z8659 Personal history of other mental and behavioral disorders: Secondary | ICD-10-CM

## 2023-01-21 NOTE — Progress Notes (Signed)
THERAPIST PROGRESS NOTE  Virtual Visit via Video Note  I connected with [name] on 01/21/23 at  1:00 PM EDT by a video enabled telemedicine application and verified that I am speaking with the correct person using two identifiers.  Location: Patient: [facility]  Provider: Providers Home    I discussed the limitations of evaluation and management by telemedicine and the availability of in person appointments. The patient expressed understanding and agreed to proceed.     I discussed the assessment and treatment plan with the patient. The patient was provided an opportunity to ask questions and all were answered. The patient agreed with the plan and demonstrated an understanding of the instructions.   The patient was advised to call back or seek an in-person evaluation if the symptoms worsen or if the condition fails to improve as anticipated.  I provided 55 minutes of non-face-to-face time during this encounter.   [name], LCSW   Participation Level: Active  Behavioral Response: CasualAlertAnxious, Depressed, and Irritable  Type of Therapy: Individual Therapy  Treatment Goals addressed:  Active     PTSD-Trauma Disorder CCP Problem  1 PTSD       Decrease PHQ-9 down 10  (Progressing)     Start:  08/18/22    Expected End:  06/11/23         Decrease GAD-7 down by 10  (Progressing)     Start:  08/18/22    Expected End:  06/11/23         Gain income either through SSDI or work.  (Progressing)     Start:  08/18/22    Expected End:  06/11/23            ProgressTowards Goals: Progressing  Interventions: Motivational Interviewing, Supportive, and Reframing  Suicidal/Homicidal: Nowithout intent/plan  Therapist Response:   Pt was alert and oriented x 5. He was dressed casually and engaged well in therapy session. Kendal presented with irritable and drowsy mood/affect.  Pt comes in today "not feeling therapy". LCSW inquired as to why? Pt reports  because "[name] is being a bitch" and "I just took my trazadone, so I am tired". LCSW spoke with pt about why he is mad at his roommate [name]). Pt states "I do not know I cannot remember". Keyvion reports memory loss from his alcoholism. LCSW also inquired to why he was taking trazadone at 1230 in the afternoon. Pt reports it does not say "nighttime on it". LCSW reviewed medication providers note which says "pause" trazadone. LCSW used solution focused therapy with [name] and pt present. LCSW told pt and roommate to take all medications in Pearlie's name to bring into medication management appointment tomorrow. They can then discuss what pt should be taking vs not taking. LCSW also educated them about possibly exploring a shot instead of oral medications. LCSW educated pt on writing things down in a journal to help him to go back and reference things due to his poor memory.     Plan: Return again in 3 weeks.  Diagnosis: Bipolar 1 disorder, depressed  PTSD (post-traumatic stress disorder)  History of nightmares  Collaboration of Care: Other None today   Patient/Guardian was advised Release of Information must be obtained prior to any record release in order to collaborate their care with an outside provider. Patient/Guardian was advised if they have not already done so to contact the registration department to sign all necessary forms in order for [place] to release information regarding their care.   Consent:  Patient/Guardian gives verbal consent for treatment and assignment of benefits for services provided during this visit. Patient/Guardian expressed understanding and agreed to proceed.   [name], LCSW 01/21/2023

## 2023-01-22 ENCOUNTER — Encounter (HOSPITAL_COMMUNITY): Payer: Self-pay | Admitting: Student in an Organized Health Care Education/Training Program

## 2023-01-22 ENCOUNTER — Telehealth (INDEPENDENT_AMBULATORY_CARE_PROVIDER_SITE_OTHER): Payer: Medicaid Other | Admitting: Student in an Organized Health Care Education/Training Program

## 2023-01-22 ENCOUNTER — Other Ambulatory Visit: Payer: Self-pay

## 2023-01-22 DIAGNOSIS — F319 Bipolar disorder, unspecified: Secondary | ICD-10-CM | POA: Diagnosis not present

## 2023-01-22 DIAGNOSIS — F515 Nightmare disorder: Secondary | ICD-10-CM

## 2023-01-22 DIAGNOSIS — F1021 Alcohol dependence, in remission: Secondary | ICD-10-CM | POA: Diagnosis not present

## 2023-01-22 DIAGNOSIS — F4310 Post-traumatic stress disorder, unspecified: Secondary | ICD-10-CM

## 2023-01-22 MED ORDER — NALTREXONE HCL 50 MG PO TABS
50.0000 mg | ORAL_TABLET | Freq: Every day | ORAL | 1 refills | Status: DC
  Filled 2023-02-14: qty 30, 30d supply, fill #0

## 2023-01-22 MED ORDER — ACAMPROSATE CALCIUM 333 MG PO TBEC
666.0000 mg | DELAYED_RELEASE_TABLET | Freq: Three times a day (TID) | ORAL | 1 refills | Status: DC
  Filled 2023-01-22 – 2023-02-14 (×2): qty 180, 30d supply, fill #0

## 2023-01-22 MED ORDER — OLANZAPINE 10 MG PO TABS
10.0000 mg | ORAL_TABLET | Freq: Every day | ORAL | 1 refills | Status: DC
  Filled 2023-01-22: qty 30, 30d supply, fill #0
  Filled 2023-02-14: qty 30, 30d supply, fill #1

## 2023-01-22 MED ORDER — OXCARBAZEPINE 150 MG PO TABS
150.0000 mg | ORAL_TABLET | Freq: Two times a day (BID) | ORAL | 1 refills | Status: DC
  Filled 2023-01-22 – 2023-02-14 (×2): qty 60, 30d supply, fill #0

## 2023-01-22 NOTE — Progress Notes (Cosign Needed Addendum)
BH MD/PA/NP OP Progress Note  Virtual Visit via Video Note  I connected with Gregory Bishop on 01/22/23 at  1:30 PM EDT by a video enabled telemedicine application and verified that I am speaking with the correct person using two identifiers.  Location: Patient: Home Provider: [facility]   I discussed the limitations of evaluation and management by telemedicine and the availability of in person appointments. The patient expressed understanding and agreed to proceed.    01/22/2023 3:54 PM Gregory Bishop  MRN:  [id number]  Chief Complaint:  Chief Complaint  Patient presents with   Follow-up   Bipolar Depressed   HPI:  Gregory Bishop is a 47 yr old male who presents via Virtual Video Visit for Follow Up and Medication Management.  PPHx is significant for Bipolar Disorder, EtOH Abuse, Insomnia, and Nightmares.    Spoke with his partner Gregory Bishop for most of this interview.  She reports that there has been a significant issue with him getting his medications.  She reports that in the new year there was an issue with the insurance market place and the calculation of his income.  She reports that due to this he was placed on a new insurance plan and it no longer paid for his medications and so they could not afford them.  She reports that due to this he has only been on the 15 mg Zyprexa and the Acamprosate for the last month.  She reports that yesterday due to his memory issues he took a Lamictal and trazodone during the day yesterday.  She reports that the Lamictal made him very irritable and nasty.  She reports it got to the point where his therapist had to talk with him about it during their session.  Discussed with her the need to remove all medications and she reports that[all medications he is not currently on have been removed.  Encouraged her to place his medications in a pill caddy and she reports that she has already done this.  Discussed that we would decrease his Zyprexa to 10 mg as had  originally been planned at last appointment.  Discussed given his mood issues we would start the Trileptal that we had discussed starting at last appointment but never did.  Discussed the need to wait for the third day after starting the Trileptal to then start the naltrexone as we did not want to start too many medications at the same time and she reported understanding.  Discussed with Gregory Bishop and he was agreeable with this plan.  Discussed with her show other concerns about his memory issues.  When asked if he has been seen by a neurologist she reports that he is not and until his insurance is reestablished that would not be able to see 1.  Discussed with both of them the importance of neurology appointment once insurance has been reestablished and encouraged an appointment with his PCP.  He reports no SI or HI.  When asked about AVH he reports always (baseline).  He reports his sleep is good.  He reports his appetite is fair.  He reports some weakness and headaches.  He will return for follow-up in approximately 6 weeks.  Discussed with patient that Resident Provider would be transitioning their care to another Resident Provider starting July 2024.  He reported understanding and had no concerns.    Visit Diagnosis:    ICD-10-CM   1. Bipolar 1 disorder, depressed  F31.9 OXcarbazepine (TRILEPTAL) 150 MG tablet    OLANZapine (ZYPREXA) 10 MG  tablet    2. Alcohol use disorder, severe, in early remission  F10.21 naltrexone (DEPADE) 50 MG tablet    acamprosate (CAMPRAL) 333 MG tablet    3. Nightmares  F51.5     4. PTSD (post-traumatic stress disorder)  F43.10       Past Psychiatric History: Bipolar Disorder, EtOH Abuse, Insomnia, and Nightmares.    Past Medical History:  Past Medical History:  Diagnosis Date   Stroke     Past Surgical History:  Procedure Laterality Date   LEG SURGERY Left 2014    Family Psychiatric History: Reports None   Family History:  Family History  Problem  Relation Age of Onset   Diabetes Maternal Uncle    Diabetes Maternal Grandfather     Social History:  Social History   Socioeconomic History   Marital status: Significant Other    Spouse name: Not on file   Number of children: 2   Years of education: Not on file   Highest education level: Not on file  Occupational History   Occupation: Unemployed  Tobacco Use   Smoking status: Every Day    Packs/day: .25    Types: Cigarettes   Smokeless tobacco: Never  Vaping Use   Vaping Use: Never used  Substance and Sexual Activity   Alcohol use: Not Currently    Alcohol/week: 6.0 standard drinks of alcohol    Types: 6 Cans of beer per week    Comment: 8 months   Drug use: No   Sexual activity: Yes    Partners: Female    Comment: 1 partner with sig other  Other Topics Concern   Not on file  Social History Narrative   Not on file   Social Determinants of Health   Financial Resource Strain: Low Risk  (07/21/2021)   Overall Financial Resource Strain (CARDIA)    Difficulty of Paying Living Expenses: Not hard at all  Food Insecurity: No Food Insecurity (07/21/2021)   Hunger Vital Sign    Worried About Running Out of Food in the Last Year: Never true    Ran Out of Food in the Last Year: Never true  Transportation Needs: No Transportation Needs (07/21/2021)   PRAPARE - Administrator, Civil Service (Medical): No    Lack of Transportation (Non-Medical): No  Physical Activity: Sufficiently Active (07/21/2021)   Exercise Vital Sign    Days of Exercise per Week: 7 days    Minutes of Exercise per Session: 60 min  Stress: Stress Concern Present (07/21/2021)   [employer] of Occupational Health - Occupational Stress Questionnaire    Feeling of Stress : Rather much  Social Connections: Socially Isolated (07/21/2021)   Social Connection and Isolation Panel [NHANES]    Frequency of Communication with Friends and Family: Once a week    Frequency of Social Gatherings with  Friends and Family: Never    Attends Religious Services: Never    Database administrator or Organizations: No    Attends Banker Meetings: Never    Marital Status: Living with partner    Allergies: No Known Allergies  Metabolic Disorder Labs: Lab Results  Component Value Date   HGBA1C 5.8 (H) 09/25/2022   MPG 120 09/25/2022   No results found for: "PROLACTIN" Lab Results  Component Value Date   CHOL 234 (H) 09/25/2022   TRIG 157 (H) 09/25/2022   HDL 59 09/25/2022   CHOLHDL 4.0 09/25/2022   VLDL 47.8 12/23/2021   LDLCALC 146 (H)  09/25/2022   LDLCALC 113 (H) 12/23/2021   Lab Results  Component Value Date   TSH 0.50 03/25/2022    Therapeutic Level Labs: No results found for: "LITHIUM" No results found for: "VALPROATE" No results found for: "CBMZ"  Current Medications: Current Outpatient Medications  Medication Sig Dispense Refill   acamprosate (CAMPRAL) 333 MG tablet Take 2 tablets (666 mg total) by mouth 3 (three) times daily with meals. 180 tablet 1   acetaminophen (TYLENOL) 500 MG tablet Take 500-1,000 mg by mouth every 6 (six) hours as needed for mild pain or headache.     albuterol (VENTOLIN HFA) 108 (90 Base) MCG/ACT inhaler Inhale 1-2 puffs into the lungs every 4 (four) hours as needed for wheezing or shortness of breath. 6.7 g 0   cetirizine (ZYRTEC ALLERGY) 10 MG tablet Take 1 tablet (10 mg total) by mouth daily. 28 tablet 0   ibuprofen (ADVIL) 200 MG tablet Take 200-400 mg by mouth every 6 (six) hours as needed for mild pain or headache.     naltrexone (DEPADE) 50 MG tablet Take 1 tablet (50 mg total) by mouth daily. 30 tablet 1   naproxen (NAPROSYN) 500 MG tablet Take 1 tablet (500 mg total) by mouth 2 (two) times daily. 30 tablet 0   NON FORMULARY Take 1-2 tablets by mouth See admin instructions. Emergen-C Citrus-Ginger Gummies, Turmeric and Ginger, Immune Support gummies- Chew 1-2 gummies by mouth daily     OLANZapine (ZYPREXA) 10 MG tablet Take 1  tablet (10 mg total) by mouth at bedtime. 30 tablet 1   ondansetron (ZOFRAN) 4 MG tablet Take 1 tablet (4 mg total) by mouth every 8 (eight) hours as needed for nausea or vomiting. 12 tablet 0   OXcarbazepine (TRILEPTAL) 150 MG tablet Take 1 tablet (150 mg total) by mouth 2 (two) times daily. 60 tablet 1   No current facility-administered medications for this visit.     Musculoskeletal: Strength & Muscle Tone: within normal limits Gait & Station:  sitting during interview Patient leans: N/A  Psychiatric Specialty Exam: Review of Systems  Respiratory:  Negative for cough and shortness of breath.   Cardiovascular:  Negative for chest pain.  Gastrointestinal:  Negative for abdominal pain, constipation, diarrhea, nausea and vomiting.  Neurological:  Positive for weakness and headaches.  Psychiatric/Behavioral:  Positive for dysphoric mood, hallucinations (baseline AVH) and sleep disturbance. Negative for suicidal ideas. The patient is nervous/anxious.     There were no vitals taken for this visit.There is no height or weight on file to calculate BMI.  General Appearance: Casual and Fairly Groomed  Eye Contact:  Poor  Speech:  Clear and Coherent  Volume:  Normal  Mood:  Dysphoric  Affect:  Congruent  Thought Process:  Disorganized  Orientation:  Full (Time, Place, and Person)  Thought Content: WDL AVH- Baseline  Suicidal Thoughts:  No  Homicidal Thoughts:  No  Memory:  Immediate;   Poor  Judgement:  Intact  Insight:  Present  Psychomotor Activity:  Normal  Concentration:  Concentration: Poor and Attention Span: Poor  Recall:  Poor  Fund of Knowledge: Poor  Language: Fair  Akathisia:  Negative  Handed:  Right  AIMS (if indicated): not done  Assets:  Communication Skills Desire for Improvement Housing Intimacy Resilience Social Support  ADL's:  Impaired  Cognition: Impaired,  Mild  Sleep:  Good   Screenings: CAGE-AID    Flowsheet Row ED to Hosp-Admission (Discharged)  from 05/22/2021 in [facility]  CAGE-AID Score 3      GAD-7    Flowsheet Row Counselor from 12/08/2022 in [facility] Counselor from 11/18/2022 in [facility] Counselor from 10/01/2022 in [facility] Counselor from 09/16/2022 in [facility] Counselor from 08/18/2022 in [facility]  Total GAD-7 Score 7 12 14 11 14       [id number]    Flowsheet Row Counselor from 12/08/2022 in [facility] Counselor from 11/18/2022 in [facility] Counselor from 10/01/2022 in [facility] Counselor from 09/16/2022 in [facility] Counselor from 08/18/2022 in [facility]  PHQ-2 Total Score 2 3 5 5 3   PHQ-9 Total Score 10 12 19 15 16       Flowsheet Row Counselor from 08/18/2022 in [facility] Video Visit from 06/05/2022 in [facility] Video Visit from 04/03/2022 in [facility]  C-SSRS RISK CATEGORY No Risk No Risk No Risk        Assessment and Plan:  Amandeep Nesmith is a 47 yr old male who presents via Virtual Video Visit for Follow Up and Medication Management.  PPHx is significant for Bipolar Disorder, EtOH Abuse, Insomnia, and Nightmares.     [facility] ran out of many of his medications and has only been on his Zyprexa and Acamprosate for approximately the last month.  We will decrease his Zyprexa to 10 mg as originally planned last appointment.  We will start Trileptal as we had originally discussed at last appointment.  He will then wait for 2 days before also restarting the naltrexone.  We will stop his trazodone given his issues with sleeping through so much of the day and night.  His doxazosin and Remeron will be paused for now and we will  evaluate restarting them at follow-up.  His memory issues are quite significant and he would benefit from a neurological workup but due to lack of insurance cannot obtain this at this time.  Once his insurance issues have been resolved will address neurology follow-up again.  He will return follow-up approximately 6 weeks.   Bipolar 1 Disorder, Depressed: -Decrease Zyprexa to 10 mg QHS.  30 tablets with 1 refill. -Start Trileptal 150 mg BID for mood stability.  60 tablets with 1 refill. -Remeron paused    Alcohol Use Disorder, severe, in early remission: -Continue Acamprosate 666 mg TID with meals.  180 (333 mg) tablets with 1 refill. -Restart Naltrexone 50 mg daily.  30 tablets with 1 refill.     Insomnia, unspecified type: -Stop Trazodone     Nightmares: -Doxazosin paused.    Collaboration of Care: Collaboration of Care: Other provider involved in patient's care [facility] Therapist  Patient/Guardian was advised Release of Information must be obtained prior to any record release in order to collaborate their care with an outside provider. Patient/Guardian was advised if they have not already done so to contact the registration department to sign all necessary forms in order for [place] to release information regarding their care.   Consent: Patient/Guardian gives verbal consent for treatment and assignment of benefits for services provided during this visit. Patient/Guardian expressed understanding and agreed to proceed.    [name], MD 01/22/2023, 3:54 PM   Follow Up Instructions:    I discussed the assessment and treatment plan with the patient. The patient was provided  an opportunity to ask questions and all were answered. The patient agreed with the plan and demonstrated an understanding of the instructions.   The patient was advised to call back or seek an in-person evaluation if the symptoms worsen or if the condition fails to improve as anticipated.  I provided  30 minutes of non-face-to-face time during this encounter.   [name], MD

## 2023-01-25 ENCOUNTER — Ambulatory Visit: Payer: Medicaid Other | Admitting: Family Medicine

## 2023-01-25 ENCOUNTER — Telehealth: Payer: Self-pay | Admitting: Family Medicine

## 2023-01-25 NOTE — Telephone Encounter (Signed)
Pt friend brings him to appts. She has an appt for herself this afternoon approx same time. Friend has rescheduled for 4/26 3:00p and said she can bring him that day.  Fee waived due to lack of transportation

## 2023-02-05 ENCOUNTER — Ambulatory Visit (INDEPENDENT_AMBULATORY_CARE_PROVIDER_SITE_OTHER): Payer: Medicaid Other | Admitting: Family Medicine

## 2023-02-05 ENCOUNTER — Encounter: Payer: Self-pay | Admitting: Family Medicine

## 2023-02-05 VITALS — BP 124/76 | HR 77 | Temp 98.6°F | Ht 72.0 in | Wt 147.6 lb

## 2023-02-05 DIAGNOSIS — R413 Other amnesia: Secondary | ICD-10-CM | POA: Diagnosis not present

## 2023-02-05 DIAGNOSIS — F319 Bipolar disorder, unspecified: Secondary | ICD-10-CM

## 2023-02-05 DIAGNOSIS — R42 Dizziness and giddiness: Secondary | ICD-10-CM | POA: Diagnosis not present

## 2023-02-05 DIAGNOSIS — F1021 Alcohol dependence, in remission: Secondary | ICD-10-CM

## 2023-02-05 NOTE — Assessment & Plan Note (Signed)
Continue to work with psychiatry.

## 2023-02-05 NOTE — Assessment & Plan Note (Signed)
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 I will refer him to neurology to assess for underlying causes. In light of his past heavy alcohol use, he would be at risk for developing alcoholic cerebellar degeneration which could impact his equilibrium. It remains critical for him to maintain long term sobriety.

## 2023-02-05 NOTE — Assessment & Plan Note (Addendum)
Likewise, he is exhibiting some short-term memory issues. I will have neurology assess severity and potential underlying causes. He could be at risk for Korsakoff syndrome, which could impair memory.

## 2023-02-05 NOTE — Assessment & Plan Note (Signed)
Continues to have periodic relapses. I strongly encourage ongoing counseling, attendance of AA, and he continue acamprosate 333 mg TID and naltrexone 50 mg daily.

## 2023-02-05 NOTE — Progress Notes (Signed)
[facility] [address] [place] [address] Dept: [phone] Dept Fax: [phone]  Chronic Care Office Visit  Subjective:    Patient ID: Gregory Bishop, male    DOB: Nov 22, 1975, 47 y.o..   MRN: [id number]  Chief Complaint  Patient presents with   Medical Management of Chronic Issues    Needs referral to Neurology.    History of Present Illness:  Patient is in today for reassessment of chronic medical issues.  Gregory Bishop has an alcohol addiction. He notes he did suffer a relapse, but is now back to being sober. He is working with a counselor Ralene Ok, LCSW) and Dr. Renaldo Fiddler (psychiatry). He remains on naltrexone 50 mg daily to assist him in maintaining sobriety. He is also taking acamprosate 333 mg 2 tabs TID. He notes he is currently applying for disability and has a collection of forms form his lawyer requesting a medical opinion on his mental and physical capabilities. Gregory Bishop notes he has a long-standing history of dysequilibrium/dizziness. He states this impairs his ability to stand or walk for longer distances. He also notes an issue with memory. He states he often forgets instructions within a day. He notes his psychiatrist has requested he see a neurologist to evaluate this.   Gregory Bishop also has bipolar disorder and PTSD. He is now managed on oxcarbazepine (Trileptal) 150 mg bid and olanzapine (Zyprexa) 15 mg at bedtime.   Past Medical History: Patient Active Problem List   Diagnosis Date Noted   History of nightmares 01/30/2022   Nightmares 01/30/2022   Wheezing 12/23/2021   Tobacco use disorder, mild, abuse 12/23/2021   Insomnia 12/05/2021   Alcohol use disorder, severe, in early remission (HCC) 07/24/2021   Bipolar 1 disorder, depressed (HCC) 07/21/2021   PTSD (post-traumatic stress disorder) 07/21/2021   Dizziness 05/22/2021   Past Surgical History:  Procedure Laterality Date   LEG SURGERY Left  2014   Family History  Problem Relation Age of Onset   Diabetes Maternal Uncle    Diabetes Maternal Grandfather    Outpatient Medications Prior to Visit  Medication Sig Dispense Refill   acamprosate (CAMPRAL) 333 MG tablet Take 2 tablets (666 mg total) by mouth 3 (three) times daily with meals. 180 tablet 1   acetaminophen (TYLENOL) 500 MG tablet Take 500-1,000 mg by mouth every 6 (six) hours as needed for mild pain or headache.     albuterol (VENTOLIN HFA) 108 (90 Base) MCG/ACT inhaler Inhale 1-2 puffs into the lungs every 4 (four) hours as needed for wheezing or shortness of breath. 6.7 g 0   cetirizine (ZYRTEC ALLERGY) 10 MG tablet Take 1 tablet (10 mg total) by mouth daily. 28 tablet 0   ibuprofen (ADVIL) 200 MG tablet Take 200-400 mg by mouth every 6 (six) hours as needed for mild pain or headache.     naltrexone (DEPADE) 50 MG tablet Take 1 tablet (50 mg total) by mouth daily. 30 tablet 1   naproxen (NAPROSYN) 500 MG tablet Take 1 tablet (500 mg total) by mouth 2 (two) times daily. 30 tablet 0   NON FORMULARY Take 1-2 tablets by mouth See admin instructions. Emergen-C Citrus-Ginger Gummies, Turmeric and Ginger, Immune Support gummies- Chew 1-2 gummies by mouth daily     OLANZapine (ZYPREXA) 10 MG tablet Take 1 tablet (10 mg total) by mouth at bedtime. 30 tablet 1   ondansetron (ZOFRAN) 4 MG tablet Take 1 tablet (4 mg total) by mouth every 8 (eight)  hours as needed for nausea or vomiting. 12 tablet 0   OXcarbazepine (TRILEPTAL) 150 MG tablet Take 1 tablet (150 mg total) by mouth 2 (two) times daily. 60 tablet 1   No facility-administered medications prior to visit.   No Known Allergies Objective:   Today's Vitals   02/05/23 1457  BP: 124/76  Pulse: 77  Temp: 98.6 F (37 C)  TempSrc: Temporal  SpO2: 95%  Weight: 147 lb 9.6 oz (67 kg)  Height: 6' (1.829 m)   Body mass index is 20.02 kg/m.   General: Well developed, well nourished. No acute distress. Psych: Alert and  oriented. Normal mood and affect.  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 I will refer him to neurology to assess for underlying causes. In light of his past heavy alcohol use, he would be at risk for developing alcoholic cerebellar degeneration which could impact his equilibrium. It remains critical for him to maintain long term sobriety.      Relevant Orders   Ambulatory referral to Neurology   Bipolar 1 disorder, depressed (HCC)    Continue to work with psychiatry.      Alcohol use disorder, severe, in early remission (HCC) - Primary    Continues to have periodic relapses. I strongly encourage ongoing counseling, attendance of AA, and he continue acamprosate 333 mg TID and naltrexone 50 mg daily.      Memory impairment    Likewise, he is exhibiting some short-term memory issues. I will have neurology assess severity and potential underlying causes. He could be at risk for Korsakoff syndrome, which could impair memory.      Relevant Orders   Ambulatory referral to Neurology    Return in about 6 months (around 08/07/2023) for Reassessment.   Loyola Mast, MD

## 2023-02-08 ENCOUNTER — Ambulatory Visit (INDEPENDENT_AMBULATORY_CARE_PROVIDER_SITE_OTHER): Payer: Medicaid Other | Admitting: Licensed Clinical Social Worker

## 2023-02-08 ENCOUNTER — Telehealth: Payer: Self-pay | Admitting: Family Medicine

## 2023-02-08 DIAGNOSIS — F4310 Post-traumatic stress disorder, unspecified: Secondary | ICD-10-CM

## 2023-02-08 DIAGNOSIS — F319 Bipolar disorder, unspecified: Secondary | ICD-10-CM | POA: Diagnosis not present

## 2023-02-08 NOTE — Telephone Encounter (Signed)
Referral moved to [facility]

## 2023-02-08 NOTE — Telephone Encounter (Signed)
Caller Name: caregiver Call back phone #: [phone]  Reason for Call: Caregiver called to say the neurology referral called them and said they do not take pts under 55.

## 2023-02-08 NOTE — Progress Notes (Signed)
THERAPIST PROGRESS NOTE  Virtual Visit via Video Note  I connected with Gregory Bishop on 02/08/23 at  1:00 PM EDT by a video enabled telemedicine application and verified that I am speaking with the correct person using two identifiers.  Location: Patient: [name]  Provider: Providers Home    I discussed the limitations of evaluation and management by telemedicine and the availability of in person appointments. The patient expressed understanding and agreed to proceed.    I discussed the assessment and treatment plan with the patient. The patient was provided an opportunity to ask questions and all were answered. The patient agreed with the plan and demonstrated an understanding of the instructions.   The patient was advised to call back or seek an in-person evaluation if the symptoms worsen or if the condition fails to improve as anticipated.  I provided 45 minutes of non-face-to-face time during this encounter.   Gregory Cooks, LCSW   Participation Level: Alert  Behavioral Response: CasualAlertAnxious and Depressed  Type of Therapy: Individual Therapy  Treatment Goals addressed:  Active     PTSD-Trauma Disorder CCP Problem  1 PTSD       Decrease PHQ-9 down 10  (Progressing)     Start:  08/18/22    Expected End:  06/11/23         Decrease GAD-7 down by 10  (Progressing)     Start:  08/18/22    Expected End:  06/11/23         Gain income either through SSDI or work.  (Progressing)     Start:  08/18/22    Expected End:  06/11/23            ProgressTowards Goals: Progressing  Interventions: CBT and Motivational Interviewing   Suicidal/Homicidal: Nowithout intent/plan  Therapist Response:   Pt was alert and oriented x 5. He was dressed casually and engaged well in therapy session. He presented with depressed and anxious mood/affect. He was pleasant, cooperative and maintained good eye contact.   Pt and Michelle (pt friend/roommate) were both in  session. They report that medications have been under control. [name] reports due to pt memory she has been putting medications in a pill pouch. She also reports setting 3 alarms on pt phone which seems to help. They have also followed the advice of the psychiatrist and followed up with PCP for referral to neurology doctor for further evaluation with his memory. Kiel endorses symptoms for irritability, mood swings, isolation, worthlessness, and hopelessness.   Interventions/Plan: LCSW used primarily solution focused therapy for today. Pt to follow up with neurology referral provided by PCP. Pt to provide this LCSW paperwork from his disability lawyer to complete. Pt and [name] instructed to leave with front desk staff. LCSW used psychoanalytic therapy for pt to express thoughts, feeling and concerns.     Plan: Return again in 4 weeks.  Diagnosis: Bipolar 1 disorder, depressed (HCC)  PTSD (post-traumatic stress disorder)  Collaboration of Care: Other None today   Patient/Guardian was advised Release of Information must be obtained prior to any record release in order to collaborate their care with an outside provider. Patient/Guardian was advised if they have not already done so to contact the registration department to sign all necessary forms in order for [place] to release information regarding their care.   Consent: Patient/Guardian gives verbal consent for treatment and assignment of benefits for services provided during this visit. Patient/Guardian expressed understanding and agreed to proceed.   Gregory Cooks,  LCSW 02/08/2023

## 2023-02-15 ENCOUNTER — Other Ambulatory Visit: Payer: Self-pay

## 2023-02-17 ENCOUNTER — Other Ambulatory Visit: Payer: Self-pay

## 2023-03-04 ENCOUNTER — Ambulatory Visit (INDEPENDENT_AMBULATORY_CARE_PROVIDER_SITE_OTHER): Payer: Medicaid Other | Admitting: Licensed Clinical Social Worker

## 2023-03-04 DIAGNOSIS — F4310 Post-traumatic stress disorder, unspecified: Secondary | ICD-10-CM

## 2023-03-04 DIAGNOSIS — F319 Bipolar disorder, unspecified: Secondary | ICD-10-CM

## 2023-03-04 NOTE — Progress Notes (Signed)
THERAPIST PROGRESS NOTE Virtual Visit via Video Note  I connected with Gregory Bishop on 03/04/23 at  1:00 PM EDT by a video enabled telemedicine application and verified that I am speaking with the correct person using two identifiers.  Location: Patient: [facility]  Provider: Provider Home    I discussed the limitations of evaluation and management by telemedicine and the availability of in person appointments. The patient expressed understanding and agreed to proceed.   I discussed the assessment and treatment plan with the patient. The patient was provided an opportunity to ask questions and all were answered. The patient agreed with the plan and demonstrated an understanding of the instructions.   The patient was advised to call back or seek an in-person evaluation if the symptoms worsen or if the condition fails to improve as anticipated.  I provided 30 minutes of non-face-to-face time during this encounter.   [name], LCSW   Participation Level: Active  Behavioral Response: CasualAlertAnxious and Depressed  Type of Therapy: Individual Therapy  Treatment Goals addressed:  Active     PTSD-Trauma Disorder CCP Problem  1 PTSD       Decrease PHQ-9 down 10  (Progressing)     Start:  08/18/22    Expected End:  06/11/23         Decrease GAD-7 down by 10  (Progressing)     Start:  08/18/22    Expected End:  06/11/23         Gain income either through SSDI or work.  (Progressing)     Start:  08/18/22    Expected End:  06/11/23       Goal Note     Pt has applied and working through the process.               ProgressTowards Goals: Progressing  Interventions: CBT, Motivational Interviewing, and Supportive   Suicidal/Homicidal: Nowithout intent/plan  Therapist Response:     Pt was alert and oriented x 5. He was dressed casually and engaged well in therapy session. He was pleasant, cooperative, and maintained good eye contact. He presented  with anxious and depressed mood/affect.  Pt reports he is "doing well". He reports taking medications 7/7 days per week. He reports his roommate/caregiver [name] setup his medications in a pill box weekly which has helped with taking has medications as directed. Pt also reports with the help of [name] he now has Neurology appointment scheduled for July.  Pt reports stressor for grief/loss. His neighbor passed away 2 weeks ago but pt did not find out until Sunday. Gregory Bishop states there is a funeral on May 31st but he does not know if he can go as he feels emotional thinking about it. Pt reports this guy was a father figure to me. LCSW spoke with pt about alternative to going to funeral such as writing a letter on his obituary.  Interventions/Plan:  LCSW spoke with pt about the 5 stages on grief/loss. LCSW educated pt on the benefits of closure and acceptance of grief/loss. LCSW used psychoanalytic therapy for pt to express thoughts, feelings, and emotions. LCSW used supportive therapy for praise and encouragement. LCSW used CBT for reframing.   Plan: Return again in 3 weeks.  Diagnosis: Bipolar 1 disorder, depressed (HCC)  PTSD (post-traumatic stress disorder)  Collaboration of Care: Other None today   Patient/Guardian was advised Release of Information must be obtained prior to any record release in order to collaborate their care with an outside provider.  Patient/Guardian was advised if they have not already done so to contact the registration department to sign all necessary forms in order for [place] to release information regarding their care.   Consent: Patient/Guardian gives verbal consent for treatment and assignment of benefits for services provided during this visit. Patient/Guardian expressed understanding and agreed to proceed.   [name], LCSW 03/04/2023

## 2023-03-05 ENCOUNTER — Other Ambulatory Visit: Payer: Self-pay

## 2023-03-05 ENCOUNTER — Telehealth (INDEPENDENT_AMBULATORY_CARE_PROVIDER_SITE_OTHER): Payer: Medicaid Other | Admitting: Student in an Organized Health Care Education/Training Program

## 2023-03-05 ENCOUNTER — Encounter (HOSPITAL_COMMUNITY): Payer: Self-pay | Admitting: Student in an Organized Health Care Education/Training Program

## 2023-03-05 DIAGNOSIS — F4310 Post-traumatic stress disorder, unspecified: Secondary | ICD-10-CM | POA: Diagnosis not present

## 2023-03-05 DIAGNOSIS — F319 Bipolar disorder, unspecified: Secondary | ICD-10-CM | POA: Diagnosis not present

## 2023-03-05 DIAGNOSIS — G4700 Insomnia, unspecified: Secondary | ICD-10-CM | POA: Diagnosis not present

## 2023-03-05 DIAGNOSIS — F1021 Alcohol dependence, in remission: Secondary | ICD-10-CM | POA: Diagnosis not present

## 2023-03-05 MED ORDER — OLANZAPINE 10 MG PO TABS
10.0000 mg | ORAL_TABLET | Freq: Every day | ORAL | 1 refills | Status: DC
  Filled 2023-03-05 – 2023-03-25 (×2): qty 30, 30d supply, fill #0
  Filled ????-??-??: fill #0

## 2023-03-05 MED ORDER — NALTREXONE HCL 50 MG PO TABS
50.0000 mg | ORAL_TABLET | Freq: Every day | ORAL | 1 refills | Status: DC
  Filled 2023-03-05 – 2023-03-25 (×2): qty 30, 30d supply, fill #0
  Filled ????-??-??: fill #0

## 2023-03-05 MED ORDER — MIRTAZAPINE 7.5 MG PO TABS
7.5000 mg | ORAL_TABLET | Freq: Every day | ORAL | 1 refills | Status: DC
  Filled 2023-03-05 – 2023-03-25 (×3): qty 30, 30d supply, fill #0
  Filled ????-??-??: fill #0

## 2023-03-05 MED ORDER — ACAMPROSATE CALCIUM 333 MG PO TBEC
666.0000 mg | DELAYED_RELEASE_TABLET | Freq: Three times a day (TID) | ORAL | 1 refills | Status: DC
  Filled 2023-03-05 – 2023-03-25 (×2): qty 180, 30d supply, fill #0
  Filled ????-??-??: fill #0

## 2023-03-05 MED ORDER — OXCARBAZEPINE 150 MG PO TABS
150.0000 mg | ORAL_TABLET | Freq: Two times a day (BID) | ORAL | 1 refills | Status: DC
  Filled 2023-03-05 – 2023-03-25 (×2): qty 60, 30d supply, fill #0
  Filled ????-??-??: fill #0

## 2023-03-05 NOTE — Progress Notes (Signed)
BH MD/PA/NP OP Progress Note  Virtual Visit via Video Note  I connected with Gregory Bishop on 03/05/23 at  1:30 PM EDT by a video enabled telemedicine application and verified that I am speaking with the correct person using two identifiers.  Location: Patient: Home Provider: [facility]   I discussed the limitations of evaluation and management by telemedicine and the availability of in person appointments. The patient expressed understanding and agreed to proceed.    03/05/2023 2:06 PM Gregory Bishop  MRN:  [id number]  Chief Complaint:  Chief Complaint  Patient presents with   Follow-up   Bipolar Depressed   HPI:  Gregory Bishop is a 47 yr old male who presents via Virtual Video Visit for Follow Up and Medication Management.  PPHx is significant for Bipolar Disorder, EtOH Abuse, Insomnia, and Nightmares.    His partner Gregory Bishop present for the interview.  He reports that he is feeling a little down lately because last week a friend of his passed away.  He reports that this was an older gentleman who was a neighbor that he used to check up on from time to time and the funeral is next Friday.  Show reports that the insurance issue has been completely resolved and he is now on full Medicaid and has a card so getting his medications is no longer an issue.  They both report that he has responded well to the Trileptal.  She reports that he is on less of an emotional roller coaster ride and that he has less anger and outbursts.  She does report that he is more awake during the day now which is both good and bad.  She reports good because he is able to help more around the house and do things instead of sleeping all day, however, that gives him more time to ruminate on drinking which has made his battle with cravings more difficult.  They both report that his sleep is significantly worse.  She also reports that he really only eats at night.  Discussed restarting Remeron to address these issues and he  was agreeable.  Discussed we would not make any other changes to his medications at this time and he was agreeable.  He reports no SI or HI.  He reports his baseline AVH.  He reports his sleep is poor.  He reports his appetite is poor.  He reports having some dizziness first thing in the morning but otherwise reports no other concerns at present.  He will return for follow up in approximately 6 weeks.  Discussed with patient that Resident Provider would be transitioning their care to another Resident Provider, Dr. Cyndie Chime, starting July 2024.  He reported understanding and had no concerns.    Visit Diagnosis:    ICD-10-CM   1. Bipolar 1 disorder, depressed (HCC)  F31.9 OXcarbazepine (TRILEPTAL) 150 MG tablet    OLANZapine (ZYPREXA) 10 MG tablet    mirtazapine (REMERON) 7.5 MG tablet    2. Alcohol use disorder, severe, in early remission (HCC)  F10.21 naltrexone (DEPADE) 50 MG tablet    acamprosate (CAMPRAL) 333 MG tablet    3. PTSD (post-traumatic stress disorder)  F43.10 OXcarbazepine (TRILEPTAL) 150 MG tablet    OLANZapine (ZYPREXA) 10 MG tablet    mirtazapine (REMERON) 7.5 MG tablet    4. Insomnia, unspecified type  G47.00 mirtazapine (REMERON) 7.5 MG tablet      Past Psychiatric History: Bipolar Disorder, EtOH Abuse, Insomnia, and Nightmares.    Past Medical History:  Past  Medical History:  Diagnosis Date   Stroke [facility]     Past Surgical History:  Procedure Laterality Date   LEG SURGERY Left 2014    Family Psychiatric History: Reports None   Family History:  Family History  Problem Relation Age of Onset   Diabetes Maternal Uncle    Diabetes Maternal Grandfather     Social History:  Social History   Socioeconomic History   Marital status: Significant Other    Spouse name: Not on file   Number of children: 2   Years of education: Not on file   Highest education level: Not on file  Occupational History   Occupation: Unemployed  Tobacco Use   Smoking status:  Every Day    Packs/day: .25    Types: Cigarettes   Smokeless tobacco: Never  Vaping Use   Vaping Use: Never used  Substance and Sexual Activity   Alcohol use: Not Currently    Alcohol/week: 6.0 standard drinks of alcohol    Types: 6 Cans of beer per week    Comment: 8 months   Drug use: No   Sexual activity: Yes    Partners: Female    Comment: 1 partner with sig other  Other Topics Concern   Not on file  Social History Narrative   Not on file   Social Determinants of Health   Financial Resource Strain: Low Risk  (07/21/2021)   Overall Financial Resource Strain (CARDIA)    Difficulty of Paying Living Expenses: Not hard at all  Food Insecurity: No Food Insecurity (07/21/2021)   Hunger Vital Sign    Worried About Running Out of Food in the Last Year: Never true    Ran Out of Food in the Last Year: Never true  Transportation Needs: No Transportation Needs (07/21/2021)   PRAPARE - Administrator, Civil Service (Medical): No    Lack of Transportation (Non-Medical): No  Physical Activity: Sufficiently Active (07/21/2021)   Exercise Vital Sign    Days of Exercise per Week: 7 days    Minutes of Exercise per Session: 60 min  Stress: Stress Concern Present (07/21/2021)   [employer] of Occupational Health - Occupational Stress Questionnaire    Feeling of Stress : Rather much  Social Connections: Socially Isolated (07/21/2021)   Social Connection and Isolation Panel [NHANES]    Frequency of Communication with Friends and Family: Once a week    Frequency of Social Gatherings with Friends and Family: Never    Attends Religious Services: Never    Database administrator or Organizations: No    Attends Engineer, structural: Never    Marital Status: Living with partner    Allergies: No Known Allergies  Metabolic Disorder Labs: Lab Results  Component Value Date   HGBA1C 5.8 (H) 09/25/2022   MPG 120 09/25/2022   No results found for: "PROLACTIN" Lab  Results  Component Value Date   CHOL 234 (H) 09/25/2022   TRIG 157 (H) 09/25/2022   HDL 59 09/25/2022   CHOLHDL 4.0 09/25/2022   VLDL 16.1 12/23/2021   LDLCALC 146 (H) 09/25/2022   LDLCALC 113 (H) 12/23/2021   Lab Results  Component Value Date   TSH 0.50 03/25/2022    Therapeutic Level Labs: No results found for: "LITHIUM" No results found for: "VALPROATE" No results found for: "CBMZ"  Current Medications: Current Outpatient Medications  Medication Sig Dispense Refill   mirtazapine (REMERON) 7.5 MG tablet Take 1 tablet (7.5 mg total) by mouth at  bedtime. 30 tablet 1   acamprosate (CAMPRAL) 333 MG tablet Take 2 tablets (666 mg total) by mouth 3 (three) times daily with meals. 180 tablet 1   acetaminophen (TYLENOL) 500 MG tablet Take 500-1,000 mg by mouth every 6 (six) hours as needed for mild pain or headache.     albuterol (VENTOLIN HFA) 108 (90 Base) MCG/ACT inhaler Inhale 1-2 puffs into the lungs every 4 (four) hours as needed for wheezing or shortness of breath. 6.7 g 0   cetirizine (ZYRTEC ALLERGY) 10 MG tablet Take 1 tablet (10 mg total) by mouth daily. 28 tablet 0   ibuprofen (ADVIL) 200 MG tablet Take 200-400 mg by mouth every 6 (six) hours as needed for mild pain or headache.     naltrexone (DEPADE) 50 MG tablet Take 1 tablet (50 mg total) by mouth daily. 30 tablet 1   naproxen (NAPROSYN) 500 MG tablet Take 1 tablet (500 mg total) by mouth 2 (two) times daily. 30 tablet 0   NON FORMULARY Take 1-2 tablets by mouth See admin instructions. Emergen-C Citrus-Ginger Gummies, Turmeric and Ginger, Immune Support gummies- Chew 1-2 gummies by mouth daily     OLANZapine (ZYPREXA) 10 MG tablet Take 1 tablet (10 mg total) by mouth at bedtime. 30 tablet 1   ondansetron (ZOFRAN) 4 MG tablet Take 1 tablet (4 mg total) by mouth every 8 (eight) hours as needed for nausea or vomiting. 12 tablet 0   OXcarbazepine (TRILEPTAL) 150 MG tablet Take 1 tablet (150 mg total) by mouth 2 (two) times  daily. 60 tablet 1   No current facility-administered medications for this visit.     Musculoskeletal: Strength & Muscle Tone: within normal limits Gait & Station:  sitting during interview Patient leans: N/A  Psychiatric Specialty Exam: Review of Systems  Respiratory:  Negative for cough and shortness of breath.   Cardiovascular:  Negative for chest pain.  Gastrointestinal:  Negative for abdominal pain, constipation, diarrhea, nausea and vomiting.  Neurological:  Negative for weakness and headaches.  Psychiatric/Behavioral:  Positive for agitation (improved), dysphoric mood, hallucinations (baseline AVH) and sleep disturbance. Negative for suicidal ideas. The patient is not nervous/anxious.     There were no vitals taken for this visit.There is no height or weight on file to calculate BMI.  General Appearance: Casual and Fairly Groomed  Eye Contact:  Poor  Speech:  Clear and Coherent  Volume:  Normal  Mood:  Dysphoric  Affect:  Congruent and Depressed  Thought Process:  Disorganized  Orientation:  Full (Time, Place, and Person)  Thought Content: WDL AVH- Baseline  Suicidal Thoughts:  No  Homicidal Thoughts:  No  Memory:  Immediate;   Poor  Judgement:  Intact  Insight:  Present  Psychomotor Activity:  Normal  Concentration:  Concentration: Poor and Attention Span: Poor  Recall:  Poor  Fund of Knowledge: Poor  Language: Fair  Akathisia:  Negative  Handed:  Right  AIMS (if indicated): not done  Assets:  Communication Skills Desire for Improvement Housing Intimacy Resilience Social Support  ADL's:  Impaired  Cognition: Impaired,  Mild  Sleep:  Poor   Screenings: CAGE-AID    Flowsheet Row ED to Hosp-Admission (Discharged) from 05/22/2021 in [facility] [place] [facility]  CAGE-AID Score 3      GAD-7    Flowsheet Row Counselor from 12/08/2022 in [facility] Counselor from 11/18/2022 in [facility] Counselor  from 10/01/2022 in [facility] Counselor from  09/16/2022 in [facility] Counselor from 08/18/2022 in [facility]  Total GAD-7 Score 7 12 14 11 14       PHQ2-9    Flowsheet Row Office Visit from 02/05/2023 in [facility] Counselor from 12/08/2022 in [facility] Counselor from 11/18/2022 in [facility] Counselor from 10/01/2022 in [facility] Counselor from 09/16/2022 in [facility]  PHQ-2 Total Score 0 2 3 5 5   PHQ-9 Total Score -- 10 12 19 15       Flowsheet Row Counselor from 08/18/2022 in [facility] Video Visit from 06/05/2022 in [facility] Video Visit from 04/03/2022 in [facility]  C-SSRS RISK CATEGORY No Risk No Risk No Risk        Assessment and Plan:  Raz Vandiest is a 47 yr old male who presents via Virtual Video Visit for Follow Up and Medication Management.  PPHx is significant for Bipolar Disorder, EtOH Abuse, Insomnia, and Nightmares.     Aaronjohn has had a positive response to starting the Trileptal with his mood swings and irritability decreasing.  His sleep has significantly worsened with stopping both trazodone and Remeron.  His appetite has also significantly decreased.  To address this we will restart his Remeron at this time.  At follow-up may consider further increasing Trileptal or if issues with sleep continue restart trazodone or doxazosin.  He return for follow-up in approximately 6 weeks.   Bipolar 1 Disorder, Depressed: -Continue Zyprexa 10 mg QHS.  30 tablets with 1 refill. -Continue Trileptal 150 mg BID for mood stability.  60 tablets with 1 refill. -Restart Remeron 7.5 mg QHS for depression, anxiety, sleep, and appetite.  30 tablets with 1  refill.     Alcohol Use Disorder, severe, in early remission: -Continue Acamprosate 666 mg TID with meals.  180 (333 mg) tablets with 1 refill. -Continue Naltrexone 50 mg daily.  30 tablets with 1 refill.     Insomnia, unspecified type: -Stop Trazodone     Nightmares: -Doxazosin paused.    Collaboration of Care: Collaboration of Care: Other provider involved in patient's care [facility] Therapist  Patient/Guardian was advised Release of Information must be obtained prior to any record release in order to collaborate their care with an outside provider. Patient/Guardian was advised if they have not already done so to contact the registration department to sign all necessary forms in order for [place] to release information regarding their care.   Consent: Patient/Guardian gives verbal consent for treatment and assignment of benefits for services provided during this visit. Patient/Guardian expressed understanding and agreed to proceed.    [name], MD 03/05/2023, 2:06 PM   Follow Up Instructions:    I discussed the assessment and treatment plan with the patient. The patient was provided an opportunity to ask questions and all were answered. The patient agreed with the plan and demonstrated an understanding of the instructions.   The patient was advised to call back or seek an in-person evaluation if the symptoms worsen or if the condition fails to improve as anticipated.  I provided 20 minutes of non-face-to-face time during this encounter.   [name], MD

## 2023-03-12 ENCOUNTER — Other Ambulatory Visit: Payer: Self-pay

## 2023-03-25 ENCOUNTER — Other Ambulatory Visit: Payer: Self-pay

## 2023-03-25 ENCOUNTER — Ambulatory Visit (INDEPENDENT_AMBULATORY_CARE_PROVIDER_SITE_OTHER): Payer: Medicaid Other | Admitting: Licensed Clinical Social Worker

## 2023-03-25 DIAGNOSIS — F4310 Post-traumatic stress disorder, unspecified: Secondary | ICD-10-CM

## 2023-03-25 DIAGNOSIS — F319 Bipolar disorder, unspecified: Secondary | ICD-10-CM | POA: Diagnosis not present

## 2023-03-25 NOTE — Progress Notes (Signed)
THERAPIST PROGRESS NOTE  Virtual Visit via Video Note  I connected with [name] on 03/25/23 at  1:00 PM EDT by a video enabled telemedicine application and verified that I am speaking with the correct person using two identifiers.  Location: Patient: [facility]  Provider: Providers Home    I discussed the limitations of evaluation and management by telemedicine and the availability of in person appointments. The patient expressed understanding and agreed to proceed.     I discussed the assessment and treatment plan with the patient. The patient was provided an opportunity to ask questions and all were answered. The patient agreed with the plan and demonstrated an understanding of the instructions.   The patient was advised to call back or seek an in-person evaluation if the symptoms worsen or if the condition fails to improve as anticipated.  I provided 30 minutes of non-face-to-face time during this encounter.   [name], LCSW   Participation Level: Active  Behavioral Response: CasualAlertAnxious and Depressed  Type of Therapy: Individual Therapy  Treatment Goals addressed:  Active     PTSD-Trauma Disorder CCP Problem  1 PTSD       Decrease PHQ-9 down 10  (Progressing)     Start:  08/18/22    Expected End:  06/11/23       Goal Note     Last score 12 current score 11          Decrease GAD-7 down by 10  (Not Progressing)     Start:  08/18/22    Expected End:  06/11/23         Gain income either through SSDI or work.  (Progressing)     Start:  08/18/22    Expected End:  06/11/23       Goal Note     Pt reports he has applied for SSDI and is awaiting next steps from social security administrations               03/25/2023    1:30 PM 12/08/2022    2:29 PM 11/18/2022    2:18 PM 10/01/2022    3:32 PM  GAD 7 : Generalized Anxiety Score  Nervous, Anxious, on Edge 2 2 1 2   Control/stop worrying 1 0 3 3  Worry too much - different things  3 0 3 1  Trouble relaxing 1 3 1 2   Restless 0 0 1 0  Easily annoyed or irritable 1 2 3 3   Afraid - awful might happen 0 0 0 3  Total GAD 7 Score 8 7 12 14   Anxiety Difficulty Very difficult Somewhat difficult Somewhat difficult Somewhat difficult        03/25/2023    1:27 PM 02/05/2023    3:06 PM 12/08/2022    2:26 PM  Depression screen PHQ 2/9  Decreased Interest 3 0 2  Down, Depressed, Hopeless 1 0 0  PHQ - 2 Score 4 0 2  Altered sleeping 3  3  Tired, decreased energy 2  0  Change in appetite 0  1  Feeling bad or failure about yourself  1  1  Trouble concentrating 0  0  Moving slowly or fidgety/restless 1  3  Suicidal thoughts 0  0  PHQ-9 Score 11  10  Difficult doing work/chores Somewhat difficult  Very difficult     ProgressTowards Goals: Progressing  Interventions: CBT, Motivational Interviewing, and Reframing   Suicidal/Homicidal: Nowithout intent/plan  Therapist Response:    Kaiyan is  alert and oriented x 5. He was dressed casually and engaged well in therapy session. He presented with anxious mood/affect. Callie was pleasant and cooperative.   Pt reports primary stressors as illness, financials, and grief/loss. Pt reports that it has been about 6 weeks since he found out about his neighbor of 7 years passing. Pt reports sadness and anger. LCSW and pt today spoke about the stage for grief/loss. LCSW spoke today that timetables will vary based on the individual person and the significance of the loss in the persons life. Pt states today "I should be over it". LCSW encouraged pt to continue to express his feeling externally to prevent severe irritability or anger.   Zacharey reports he remains staying sober and has not drink in the last 6 months. LCSW educated pt on the benefits for sobriety from alcohol and the adverse side effects for mixing alcohol with his medications. Pt reports that he continues to stay motivated to seek out help for both physical and mental health Dx.  LCSW did note he does not have a f/u appointment for [facility] medication provider. He will be transitioning care to new provider as old provider will be a PY4 and will have alternative scheduling and blocks required of him. LCSW forward information to scheduling staff to transition pt. LCSW administered a GAD-7. LCSW notes a 1 point increase. LCSW administered a PHQ-9 and notes a 1 point decrease.     Plan: Return again in 3 weeks.  Diagnosis: Bipolar 1 disorder, depressed (HCC)  PTSD (post-traumatic stress disorder)  Collaboration of Care: Other None today   Patient/Guardian was advised Release of Information must be obtained prior to any record release in order to collaborate their care with an outside provider. Patient/Guardian was advised if they have not already done so to contact the registration department to sign all necessary forms in order for [place] to release information regarding their care.   Consent: Patient/Guardian gives verbal consent for treatment and assignment of benefits for services provided during this visit. Patient/Guardian expressed understanding and agreed to proceed.   [name], LCSW 03/25/2023

## 2023-03-26 ENCOUNTER — Ambulatory Visit (INDEPENDENT_AMBULATORY_CARE_PROVIDER_SITE_OTHER): Payer: Medicaid Other | Admitting: Family Medicine

## 2023-03-26 ENCOUNTER — Other Ambulatory Visit: Payer: Self-pay

## 2023-03-26 ENCOUNTER — Encounter: Payer: Self-pay | Admitting: Family Medicine

## 2023-03-26 VITALS — BP 124/70 | HR 79 | Temp 98.6°F | Ht 72.0 in | Wt 150.0 lb

## 2023-03-26 DIAGNOSIS — F1021 Alcohol dependence, in remission: Secondary | ICD-10-CM

## 2023-03-26 DIAGNOSIS — F319 Bipolar disorder, unspecified: Secondary | ICD-10-CM | POA: Diagnosis not present

## 2023-03-26 DIAGNOSIS — N529 Male erectile dysfunction, unspecified: Secondary | ICD-10-CM

## 2023-03-26 MED ORDER — SILDENAFIL CITRATE 50 MG PO TABS
50.0000 mg | ORAL_TABLET | Freq: Every day | ORAL | 11 refills | Status: AC | PRN
Start: 2023-03-26 — End: ?
  Filled 2023-03-26: qty 10, 30d supply, fill #0

## 2023-03-26 NOTE — Assessment & Plan Note (Signed)
Continue to work with psychiatry. Continue oxcarbazepine (Trileptal) 150 mg bid, olanzapine (Zyprexa) 15 mg at bedtime, and mirtazapine 7.5 mg daily.

## 2023-03-26 NOTE — Assessment & Plan Note (Signed)
Maintaining sobriety for the past 2 months. I strongly encourage ongoing counseling, attendance of AA, and he should continue acamprosate 333 mg 2 tabs TID and naltrexone 50 mg daily.

## 2023-03-26 NOTE — Assessment & Plan Note (Signed)
This may be related to his various psych meds. I will have Mr. Kassis try sildenafil and see if this improves his erections.

## 2023-03-26 NOTE — Progress Notes (Signed)
[facility] [address] [place] [address] Dept: [phone] Dept Fax: [phone]  Chronic Care Office Visit  Subjective:    Patient ID: Gregory Bishop, male    DOB: [birth year], 47 y.o..   MRN: [id number]  Chief Complaint  Patient presents with   Medical Management of Chronic Issues    6 month f/u.   No concerns.    History of Present Illness:  Patient is in today for reassessment of chronic medical issues.  Gregory Bishop has an alcohol addiction. He had a relapse earlier int he spring, but has been maintaining sobriety over the past 2 months. He is working with a counselor Ralene Ok, LCSW) and Dr. Renaldo Fiddler (psychiatry). He remains on naltrexone 50 mg daily and acamprosate 333 mg 2 tabs TID.   Gregory Bishop also has bipolar disorder and PTSD. He is now managed on oxcarbazepine (Trileptal) 150 mg bid, olanzapine (Zyprexa) 15 mg at bedtime, and mirtazapine 7.5 mg daily (recently added).  Gregory Bishop does note some progressive issues with difficulty achieving an erection. He feels this is interfering with his relationship.  Past Medical History: Patient Active Problem List   Diagnosis Date Noted   Erectile dysfunction 03/26/2023   Memory impairment 02/05/2023   History of nightmares 01/30/2022   Nightmares 01/30/2022   Wheezing 12/23/2021   Tobacco use disorder, mild, abuse 12/23/2021   Insomnia 12/05/2021   Alcohol use disorder, severe, in early remission (HCC) 07/24/2021   Bipolar 1 disorder, depressed (HCC) 07/21/2021   PTSD (post-traumatic stress disorder) 07/21/2021   Dizziness 05/22/2021   Past Surgical History:  Procedure Laterality Date   LEG SURGERY Left 2014   Family History  Problem Relation Age of Onset   Diabetes Maternal Uncle    Diabetes Maternal Grandfather    Outpatient Medications Prior to Visit  Medication Sig Dispense Refill   acamprosate (CAMPRAL) 333 MG tablet Take 2 tablets (666 mg  total) by mouth 3 (three) times daily with meals. 180 tablet 1   acetaminophen (TYLENOL) 500 MG tablet Take 500-1,000 mg by mouth every 6 (six) hours as needed for mild pain or headache.     albuterol (VENTOLIN HFA) 108 (90 Base) MCG/ACT inhaler Inhale 1-2 puffs into the lungs every 4 (four) hours as needed for wheezing or shortness of breath. 6.7 g 0   cetirizine (ZYRTEC ALLERGY) 10 MG tablet Take 1 tablet (10 mg total) by mouth daily. 28 tablet 0   ibuprofen (ADVIL) 200 MG tablet Take 200-400 mg by mouth every 6 (six) hours as needed for mild pain or headache.     mirtazapine (REMERON) 7.5 MG tablet Take 1 tablet (7.5 mg total) by mouth at bedtime. 30 tablet 1   naltrexone (DEPADE) 50 MG tablet Take 1 tablet (50 mg total) by mouth daily. 30 tablet 1   NON FORMULARY Take 1-2 tablets by mouth See admin instructions. Emergen-C Citrus-Ginger Gummies, Turmeric and Ginger, Immune Support gummies- Chew 1-2 gummies by mouth daily     OLANZapine (ZYPREXA) 10 MG tablet Take 1 tablet (10 mg total) by mouth at bedtime. 30 tablet 1   ondansetron (ZOFRAN) 4 MG tablet Take 1 tablet (4 mg total) by mouth every 8 (eight) hours as needed for nausea or vomiting. 12 tablet 0   OXcarbazepine (TRILEPTAL) 150 MG tablet Take 1 tablet (150 mg total) by mouth 2 (two) times daily. 60 tablet 1   naproxen (NAPROSYN) 500 MG tablet Take 1 tablet (500 mg total) by mouth  2 (two) times daily. 30 tablet 0   No facility-administered medications prior to visit.   No Known Allergies Objective:   Today's Vitals   03/26/23 1356  BP: 124/70  Pulse: 79  Temp: 98.6 F (37 C)  TempSrc: Temporal  SpO2: 96%  Weight: 150 lb (68 kg)  Height: 6' (1.829 m)   Body mass index is 20.34 kg/m.   General: Well developed, well nourished. No acute distress. Psych: Alert and oriented. Normal mood and affect.  Health Maintenance Due  Topic Date Due   Colonoscopy  Never done     Assessment & Plan:   Problem List Items Addressed This  Visit       Other   Bipolar 1 disorder, depressed (HCC)    Continue to work with psychiatry. Continue oxcarbazepine (Trileptal) 150 mg bid, olanzapine (Zyprexa) 15 mg at bedtime, and mirtazapine 7.5 mg daily.      Alcohol use disorder, severe, in early remission (HCC) - Primary    Maintaining sobriety for the past 2 months. I strongly encourage ongoing counseling, attendance of AA, and he should continue acamprosate 333 mg 2 tabs TID and naltrexone 50 mg daily.      Erectile dysfunction    This may be related to his various psych meds. I will have Gregory Bishop try sildenafil and see if this improves his erections.       Relevant Medications   sildenafil (VIAGRA) 50 MG tablet    Return in about 6 months (around 09/25/2023) for Reassessment.   Loyola Mast, MD

## 2023-03-29 IMAGING — CT CT HEAD W/O CM
4 series · 17 of 47 positions shown, 19 images · non-contrast
Comparison: None.

CLINICAL DATA: Acute neuro deficit.  Dizziness and weakness.

EXAM:
CT HEAD WITHOUT CONTRAST
TECHNIQUE: Contiguous axial images were obtained from the base of the skull
through the vertex without intravenous contrast.

[Series 2: head wo · axial · 0.41mm/px · z∈[+1413,+1533]mm · 7 of 33 slices shown, 9 images]
[im 5/33  brain]
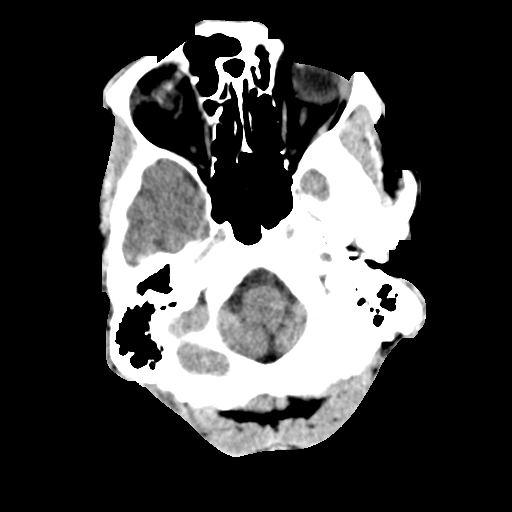
[im 5/33  bone]
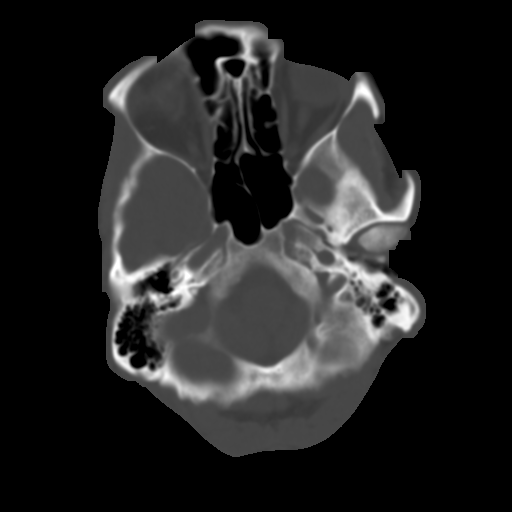
[im 9/33  brain]
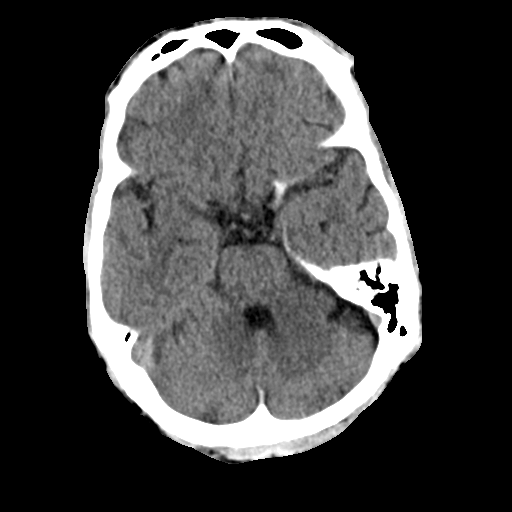
[im 13/33  brain]
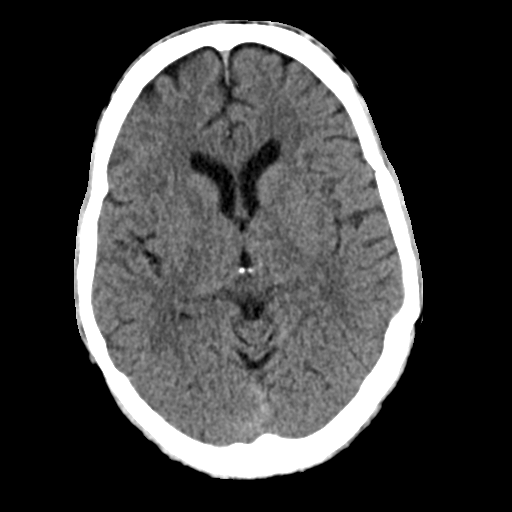
[im 17/33  brain]
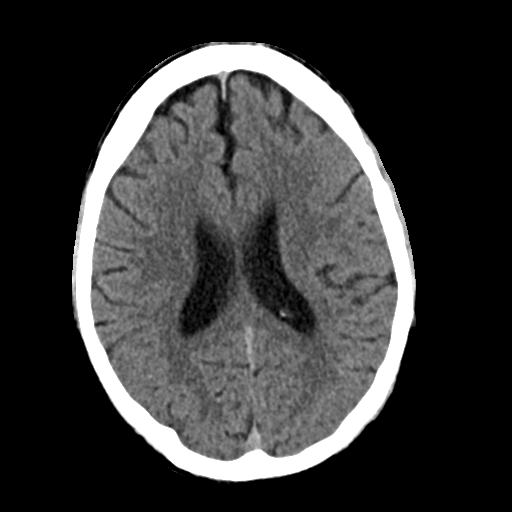
[im 21/33  brain]
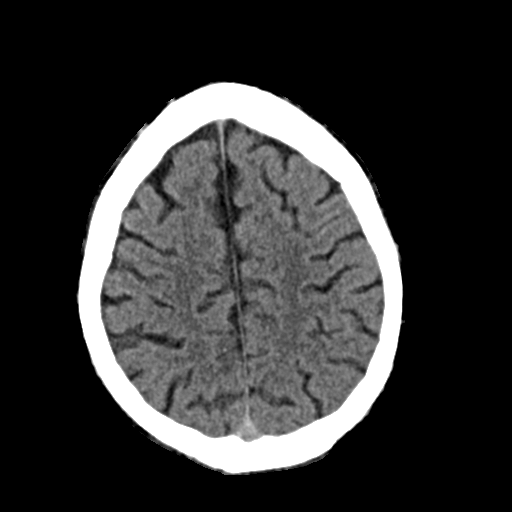
[im 21/33  bone]
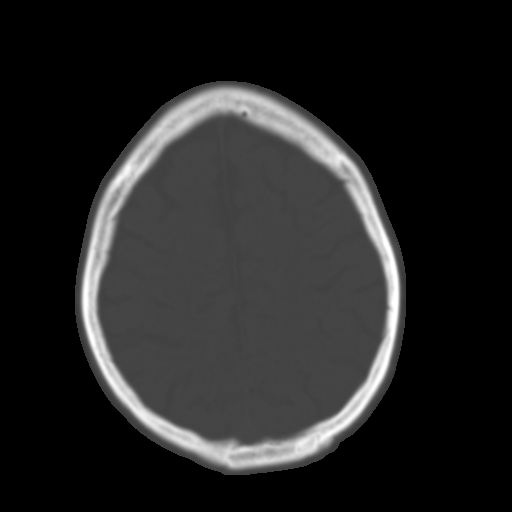
[im 25/33  brain]
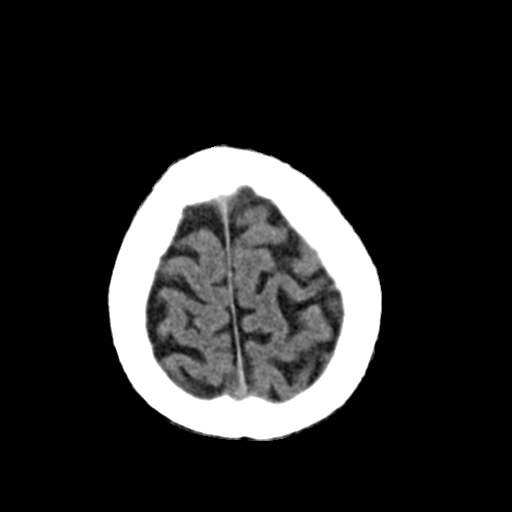
[im 29/33  brain]
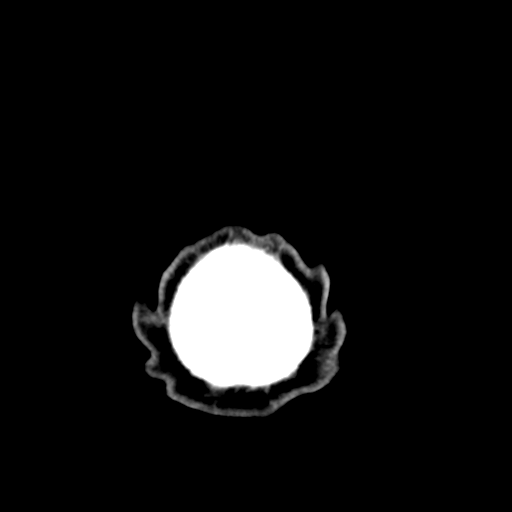

[Series 3: head bone · axial · 0.41mm/px · z∈[+1409,+1465]mm · 4 of 82 slices shown]
[im 9/82  bone]
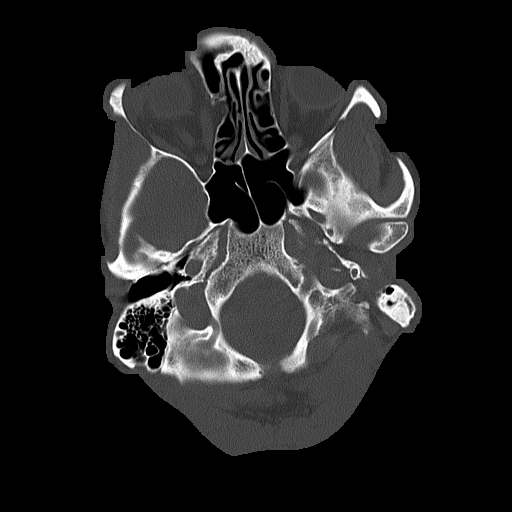
[im 17/82  bone]
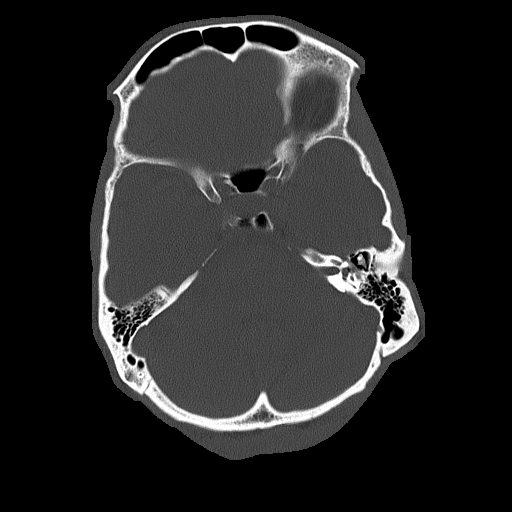
[im 25/82  bone]
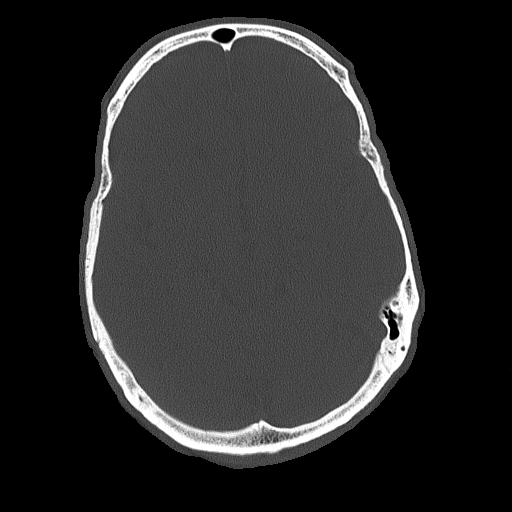
[im 37/82  bone]
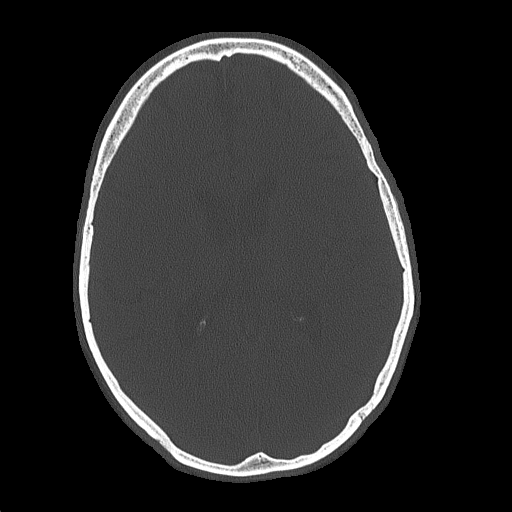

[Series 4: coronal soft tissue · coronal · 0.30mm/px · 3 of 67 slices shown]
[im 23/67  brain]
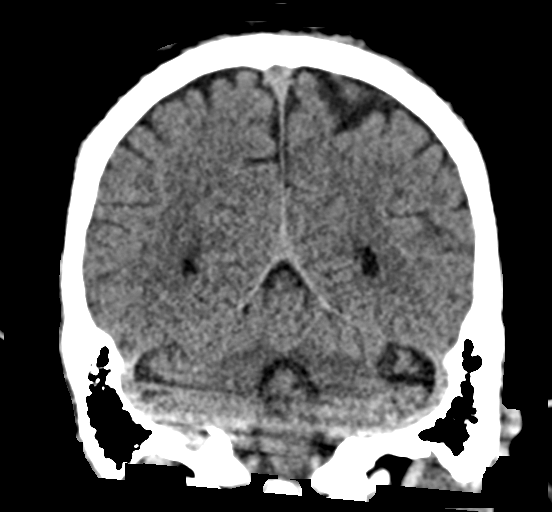
[im 30/67  brain]
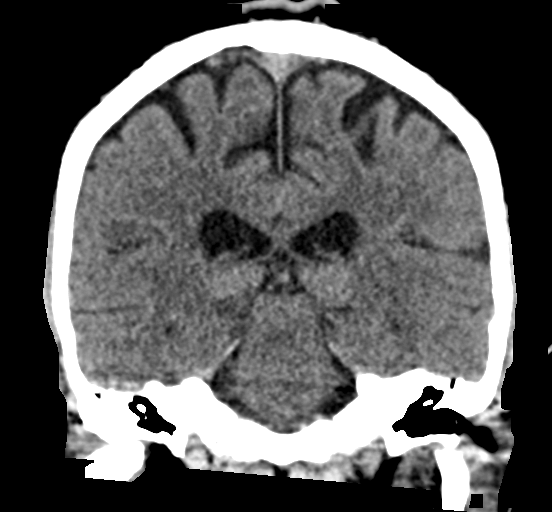
[im 37/67  brain]
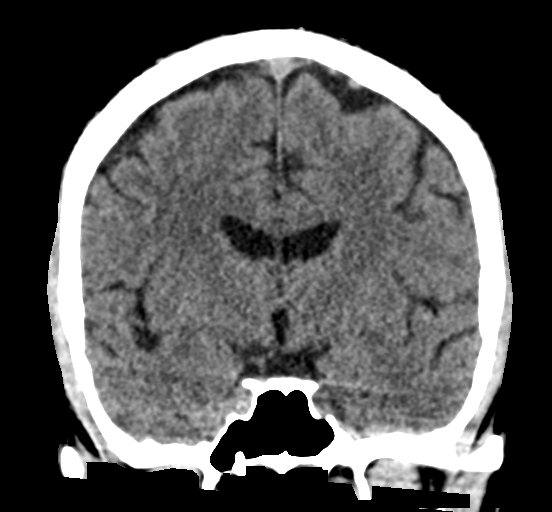

[Series 5: sagittal soft tissue · sagittal · 0.30mm/px · 3 of 56 slices shown]
[im 19/56  brain]
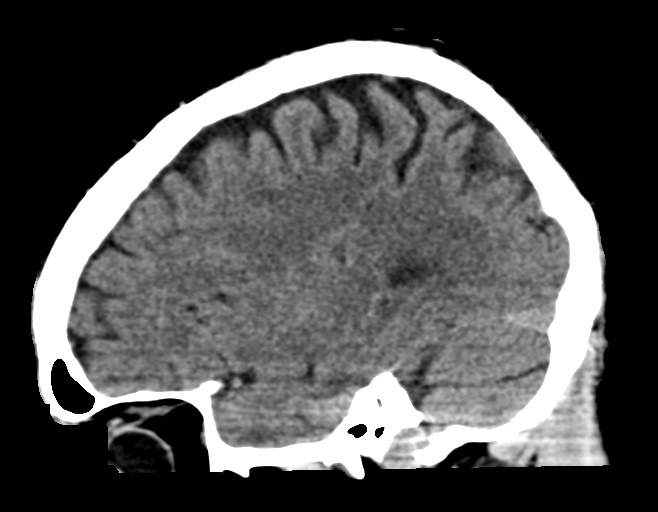
[im 28/56  brain]
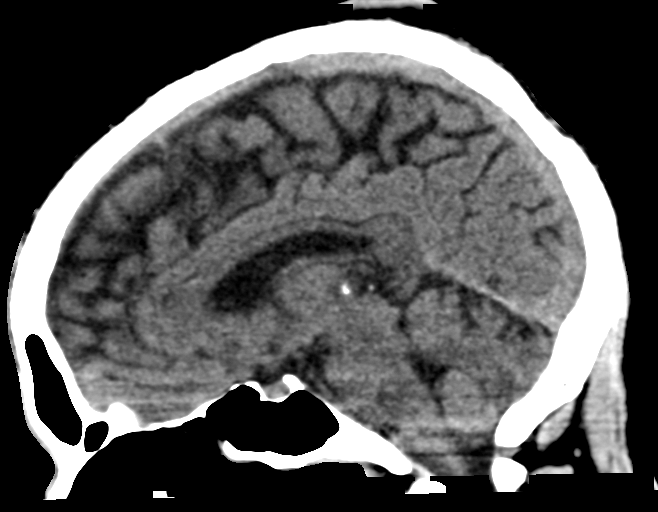
[im 37/56  brain]
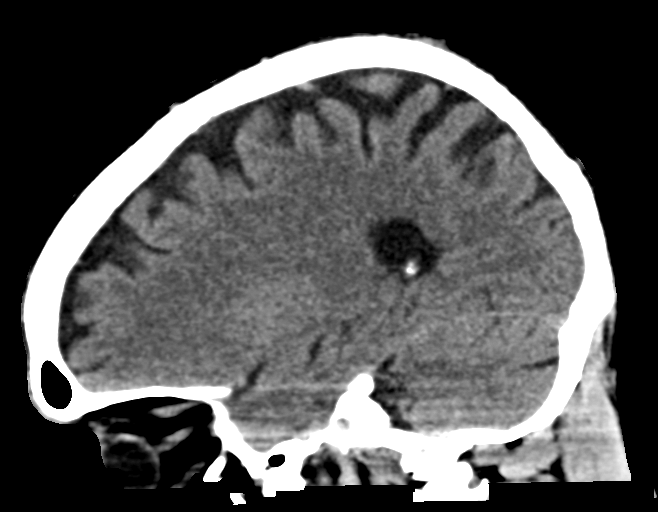

[17 of 47 positions shown; findings below may reference images not displayed]

FINDINGS: Brain: No evidence of acute infarction, hemorrhage, hydrocephalus,
extra-axial collection or mass lesion/mass effect.

Vascular: Negative for hyperdense vessel

Skull: Negative

Sinuses/Orbits: Mild mucosal edema paranasal sinuses with prior
surgery. Negative for orbital lesion

Other: None
IMPRESSION: Negative CT head

## 2023-03-31 ENCOUNTER — Other Ambulatory Visit: Payer: Self-pay

## 2023-03-31 ENCOUNTER — Telehealth (INDEPENDENT_AMBULATORY_CARE_PROVIDER_SITE_OTHER): Payer: Medicaid Other | Admitting: Student

## 2023-03-31 ENCOUNTER — Encounter (HOSPITAL_COMMUNITY): Payer: Self-pay | Admitting: Student

## 2023-03-31 DIAGNOSIS — F319 Bipolar disorder, unspecified: Secondary | ICD-10-CM | POA: Diagnosis not present

## 2023-03-31 DIAGNOSIS — F1021 Alcohol dependence, in remission: Secondary | ICD-10-CM

## 2023-03-31 DIAGNOSIS — F17200 Nicotine dependence, unspecified, uncomplicated: Secondary | ICD-10-CM | POA: Diagnosis not present

## 2023-03-31 DIAGNOSIS — F4310 Post-traumatic stress disorder, unspecified: Secondary | ICD-10-CM | POA: Diagnosis not present

## 2023-03-31 MED ORDER — ACAMPROSATE CALCIUM 333 MG PO TBEC
666.0000 mg | DELAYED_RELEASE_TABLET | Freq: Three times a day (TID) | ORAL | 1 refills | Status: DC
Start: 2023-03-31 — End: 2023-06-11
  Filled 2023-03-31 – 2023-04-28 (×2): qty 180, 30d supply, fill #0
  Filled 2023-05-28: qty 180, 30d supply, fill #1

## 2023-03-31 MED ORDER — OLANZAPINE 10 MG PO TABS
10.0000 mg | ORAL_TABLET | Freq: Every day | ORAL | 1 refills | Status: DC
Start: 2023-03-31 — End: 2023-06-11
  Filled 2023-03-31 – 2023-04-28 (×2): qty 30, 30d supply, fill #0
  Filled 2023-05-28: qty 30, 30d supply, fill #1

## 2023-03-31 MED ORDER — NALTREXONE HCL 50 MG PO TABS
50.0000 mg | ORAL_TABLET | Freq: Every day | ORAL | 1 refills | Status: DC
Start: 2023-03-31 — End: 2023-06-11
  Filled 2023-03-31 – 2023-04-28 (×2): qty 30, 30d supply, fill #0
  Filled 2023-05-28: qty 30, 30d supply, fill #1

## 2023-03-31 MED ORDER — OXCARBAZEPINE 300 MG PO TABS
300.0000 mg | ORAL_TABLET | Freq: Every day | ORAL | 0 refills | Status: DC
Start: 2023-03-31 — End: 2023-06-11
  Filled 2023-03-31 – 2023-04-28 (×2): qty 30, 30d supply, fill #0

## 2023-03-31 MED ORDER — GABAPENTIN 100 MG PO CAPS
100.0000 mg | ORAL_CAPSULE | Freq: Two times a day (BID) | ORAL | 0 refills | Status: DC
  Filled 2023-03-31 – 2023-04-28 (×2): qty 60, 30d supply, fill #0

## 2023-03-31 NOTE — Progress Notes (Signed)
BH MD/PA/NP OP Progress Note [facility]  Virtual Visit via Video Note  I connected with [name] on 03/31/23 at  2:00 PM EDT by a video enabled telemedicine application and verified that I am speaking with the correct person using two identifiers.  Location: Patient: Home Provider: [facility]   I discussed the limitations of evaluation and management by telemedicine and the availability of in person appointments. The patient expressed understanding and agreed to proceed.  03/31/2023 2:06 PM Hollice Curnutt  MRN:  [id number]  Last seen by Dr. Renaldo Fiddler on 03/05/2023  Chief Complaint:  Chief Complaint  Patient presents with   Depression   Alcohol Problem   HPI:  Jahdai Beyers is a 47 yr old male who presents via Virtual Video Visit for Follow Up and Medication Management. PPHx is significant for Bipolar 1 Disorder, AUD, tobacco use disorder.  Friend/caretaker Jolyn Nap was present during the 2nd half of his appointment with patient's consent.  Reported that his EtOH craving remains pretty bad, especially with recent stressors of trying to get in contact with dad for father's day. He has not had any EtOH. Reported medication adherence to naltrexone and acamprosate, which does help some with his cravings.  Reported that he has had improvement in his anxiety, irritability, mood lability after starting trileptal. Showing his is fingernails, saying that he is excited that he has to use nail clippers because he is no longer biting them off from anxiety. Reported that the trileptal does make him sleepy. Although improved, there is still considerable anxiety, irritability currently. After discussing the risk, benefits, side effects, patient is amenable to starting gabapentin to help with craving and anxiety. Instructed that patient is to see his PCP for a repeat CMP and vitamin D level, for low energy.  Also a CMP to check his liver enzymes to ensure that they are within range to start  increasing naltrexone.  Additional, his sleep is still poor. He sleeps 12x/day, both ~3-5hrs a time. He sleeps ~11p-4a and again from ~3p-7p. Reported that he takes his trileptal around 1p or 2p and he would be asleep in the next hour. Same for dinner time.  Stated that he drinks either 1 cup of homemade coffee or a can of caffeinated soda roughly once a day in the afternoon to help with his sleepiness.  Stated he drinks it around 2 or 3 PM. Reported that he has not been using the Remeron, he did dispense it.  Rather he has been using the left over trazodone that he has had in the past.  Stated that the trazodone is not helping him at all.  Discussed with patient that since trazodone is not working and that he is not using the Remeron, that he should remove and there would both of those medications.  Patient is also instructed to not have any more caffeine after 11 AM, refrain from daytime napping, to stay out of bed at night if he is not sleeping, and to wake up at the same time daily best of his ability.  Reported that his appetite is not a concern. He is able and wants to eat, but is concerned for the somnolence that follows.   Per caretaker/friend, patient does get confused and has difficulties with remembering his medications and accidentally taking more than intended on multiple occasions.  However it has been months since this happened, after she started taking over his medications and helping administer them.  Stated that patient has an appointment with the  neurology for a workup for his poor memory.  Safety: Reported vague AH over 2 weeks ago, reported that they are mumbling sounds, that he can understand.  Denied any command auditory hallucinations.  Denied any concerns for them, he knows that they are not real.   Denied active and passive SI/HI. Denied VH, paranoia, first rank symptoms.  Visit Diagnosis:    ICD-10-CM   1. Bipolar 1 disorder, depressed (HCC)  F31.9     2. PTSD  (post-traumatic stress disorder)  F43.10     3. Alcohol use disorder, severe, in early remission (HCC)  F10.21     4. Tobacco use disorder, mild, abuse  F17.200       Past Psychiatric History: Bipolar Disorder, alcohol use disorder, tobacco use disorder  Past Medical History:  Past Medical History:  Diagnosis Date   Stroke [facility])     Past Surgical History:  Procedure Laterality Date   LEG SURGERY Left 2014    Family Psychiatric History: Reports None   Family History:  Family History  Problem Relation Age of Onset   Diabetes Maternal Uncle    Diabetes Maternal Grandfather     Social History:  Social History   Socioeconomic History   Marital status: Significant Other    Spouse name: Not on file   Number of children: 2   Years of education: Not on file   Highest education level: Not on file  Occupational History   Occupation: Unemployed  Tobacco Use   Smoking status: Every Day    Packs/day: .25    Types: Cigarettes   Smokeless tobacco: Never  Vaping Use   Vaping Use: Never used  Substance and Sexual Activity   Alcohol use: Not Currently    Alcohol/week: 6.0 standard drinks of alcohol    Types: 6 Cans of beer per week    Comment: 8 months   Drug use: No   Sexual activity: Yes    Partners: Female    Comment: 1 partner with sig other  Other Topics Concern   Not on file  Social History Narrative   Not on file   Social Determinants of Health   Financial Resource Strain: Low Risk  (07/21/2021)   Overall Financial Resource Strain (CARDIA)    Difficulty of Paying Living Expenses: Not hard at all  Food Insecurity: No Food Insecurity (07/21/2021)   Hunger Vital Sign    Worried About Running Out of Food in the Last Year: Never true    Ran Out of Food in the Last Year: Never true  Transportation Needs: No Transportation Needs (07/21/2021)   PRAPARE - Administrator, Civil Service (Medical): No    Lack of Transportation (Non-Medical): No  Physical  Activity: Sufficiently Active (07/21/2021)   Exercise Vital Sign    Days of Exercise per Week: 7 days    Minutes of Exercise per Session: 60 min  Stress: Stress Concern Present (07/21/2021)   [employer] of Occupational Health - Occupational Stress Questionnaire    Feeling of Stress : Rather much  Social Connections: Socially Isolated (07/21/2021)   Social Connection and Isolation Panel [NHANES]    Frequency of Communication with Friends and Family: Once a week    Frequency of Social Gatherings with Friends and Family: Never    Attends Religious Services: Never    Database administrator or Organizations: No    Attends Banker Meetings: Never    Marital Status: Living with partner    Allergies:  No Known Allergies  Metabolic Disorder Labs: Lab Results  Component Value Date   HGBA1C 5.8 (H) 09/25/2022   MPG 120 09/25/2022   No results found for: "PROLACTIN" Lab Results  Component Value Date   CHOL 234 (H) 09/25/2022   TRIG 157 (H) 09/25/2022   HDL 59 09/25/2022   CHOLHDL 4.0 09/25/2022   VLDL 25.3 12/23/2021   LDLCALC 146 (H) 09/25/2022   LDLCALC 113 (H) 12/23/2021   Lab Results  Component Value Date   TSH 0.50 03/25/2022    Therapeutic Level Labs: No results found for: "LITHIUM" No results found for: "VALPROATE" No results found for: "CBMZ"  Current Medications: Current Outpatient Medications  Medication Sig Dispense Refill   acamprosate (CAMPRAL) 333 MG tablet Take 2 tablets (666 mg total) by mouth 3 (three) times daily with meals. 180 tablet 1   acetaminophen (TYLENOL) 500 MG tablet Take 500-1,000 mg by mouth every 6 (six) hours as needed for mild pain or headache.     albuterol (VENTOLIN HFA) 108 (90 Base) MCG/ACT inhaler Inhale 1-2 puffs into the lungs every 4 (four) hours as needed for wheezing or shortness of breath. 6.7 g 0   cetirizine (ZYRTEC ALLERGY) 10 MG tablet Take 1 tablet (10 mg total) by mouth daily. 28 tablet 0   ibuprofen (ADVIL)  200 MG tablet Take 200-400 mg by mouth every 6 (six) hours as needed for mild pain or headache.     mirtazapine (REMERON) 7.5 MG tablet Take 1 tablet (7.5 mg total) by mouth at bedtime. 30 tablet 1   naltrexone (DEPADE) 50 MG tablet Take 1 tablet (50 mg total) by mouth daily. 30 tablet 1   NON FORMULARY Take 1-2 tablets by mouth See admin instructions. Emergen-C Citrus-Ginger Gummies, Turmeric and Ginger, Immune Support gummies- Chew 1-2 gummies by mouth daily     OLANZapine (ZYPREXA) 10 MG tablet Take 1 tablet (10 mg total) by mouth at bedtime. 30 tablet 1   ondansetron (ZOFRAN) 4 MG tablet Take 1 tablet (4 mg total) by mouth every 8 (eight) hours as needed for nausea or vomiting. 12 tablet 0   OXcarbazepine (TRILEPTAL) 150 MG tablet Take 1 tablet (150 mg total) by mouth 2 (two) times daily. 60 tablet 1   sildenafil (VIAGRA) 50 MG tablet Take 1 tablet (50 mg total) by mouth daily as needed for erectile dysfunction. 10 tablet 11   No current facility-administered medications for this visit.     Musculoskeletal: Strength & Muscle Tone: within normal limits Gait & Station:  sitting during interview Patient leans: N/A  Psychiatric Specialty Exam: Review of Systems  Constitutional:  Positive for fatigue. Negative for appetite change.  Respiratory:  Negative for shortness of breath.   Cardiovascular:  Negative for chest pain.  Neurological:  Positive for dizziness. Negative for syncope, weakness and headaches.    There were no vitals taken for this visit.There is no height or weight on file to calculate BMI.  General Appearance: Casual and Fairly Groomed  Eye Contact: Fair  Speech:  Clear and Coherent  Volume:  Normal  Mood: "Good"  Affect: Congruent, appropriate, full range  Thought Process: Coherent, circumstantial at times, mostly linear  Orientation:  Full (Time, Place, and Person)  Thought Content: WDL AH of mumbling, he is able to reality test, noncommanding.  Denied VH.  Suicidal  Thoughts:  No  Homicidal Thoughts:  No  Memory: Fair  Judgement:  Intact  Insight: Shallow  Psychomotor Activity:  Normal  Concentration: Fair  Recall: Jennelle Human of Knowledge: Fair  Language: Good  Akathisia:  Negative  Handed:  Right  AIMS (if indicated): not done  Assets:  Communication Skills Desire for Improvement Housing Intimacy Resilience Social Support  ADL's:  Impaired  Cognition: Impaired,  Mild  Sleep:  Poor   Screenings: CAGE-AID    Flowsheet Row ED to Hosp-Admission (Discharged) from 05/22/2021 in [facility] [place] [facility]  CAGE-AID Score 3      GAD-7    Flowsheet Row Counselor from 03/25/2023 in [facility] Counselor from 12/08/2022 in [facility] Counselor from 11/18/2022 in [facility] Counselor from 10/01/2022 in [facility] Counselor from 09/16/2022 in [facility]  Total GAD-7 Score 8 7 12 14 11       PHQ2-9    Flowsheet Row Office Visit from 03/26/2023 in [facility] Counselor from 03/25/2023 in [facility] Office Visit from 02/05/2023 in [facility] from 12/08/2022 in [facility] Counselor from 11/18/2022 in [facility]  PHQ-2 Total Score 0 4 0 2 3  PHQ-9 Total Score -- 11 -- 10 12      Flowsheet Row Counselor from 08/18/2022 in [facility] Video Visit from 06/05/2022 in [facility] Video Visit from 04/03/2022 in [facility]  C-SSRS RISK CATEGORY No Risk No Risk No Risk        Assessment and Plan:  Samvel Sturdy is a 47 yr old male who presents via Virtual Video Visit for Follow Up and Medication Management.  PPHx is significant for Bipolar 1 Disorder,  alcohol use d/o, tobacco use d/o  Bipolar 1 Disorder, Depressed: Depressed mood, irritability, mood lability, anxiety has improved with Trileptal.  Although still residual symptoms.  Sleep is still poor, appears to be due to poor sleep hygiene, with twice a day sleeping and not ideal time for caffeine intake.  Discussed with him about sleep hygiene.  Also concerned that his daytime Trileptal may be sedating him.  Will consolidate this to nighttime only. -Continue Zyprexa 10 mg QHS.   -CHANGED Trileptal 150 mg BID to 300 mg qHS -DISCONTINUED Remeron 7.5 mg at bedtime -Instructed patient to stop taking remainder trazodone and throw them away -Follow-up vitamin D level from PCP  Alcohol Use Disorder, severe, in early remission: Continues to remain abstinent, there is still considerable craving leading to irritability and anxiety.  Would like to increase naltrexone dose, however will wait until CMP results to check for LFTs before doing so.  -Continue Acamprosate 666 mg TID with meals.   -Continue Naltrexone 50 mg daily.  -Follow-up CMP from PCP  Collaboration of Care: Collaboration of Care: Other provider involved in patient's care AEB [facility] Therapist  Patient/Guardian was advised Release of Information must be obtained prior to any record release in order to collaborate their care with an outside provider. Patient/Guardian was advised if they have not already done so to contact the registration department to sign all necessary forms in order for [place] to release information regarding their care.   Consent: Patient/Guardian gives verbal consent for treatment and assignment of benefits for services provided during this visit. Patient/Guardian expressed understanding and agreed to proceed.   Princess Bruins, DO Psych Resident, PGY-2 03/31/2023, 2:06 PM

## 2023-04-01 ENCOUNTER — Other Ambulatory Visit: Payer: Self-pay

## 2023-04-07 ENCOUNTER — Other Ambulatory Visit: Payer: Self-pay

## 2023-04-22 ENCOUNTER — Ambulatory Visit (HOSPITAL_COMMUNITY): Payer: MEDICAID | Admitting: Licensed Clinical Social Worker

## 2023-04-22 DIAGNOSIS — F4310 Post-traumatic stress disorder, unspecified: Secondary | ICD-10-CM

## 2023-04-22 DIAGNOSIS — F319 Bipolar disorder, unspecified: Secondary | ICD-10-CM | POA: Diagnosis not present

## 2023-04-22 NOTE — Progress Notes (Signed)
THERAPIST PROGRESS NOTE  Virtual Visit via Video Note  I connected with Gregory Bishop on 04/22/23 at  1:00 PM EDT by a video enabled telemedicine application and verified that I am speaking with the correct person using two identifiers.  Location: Patient: [facility]  Provider: Providers Home    I discussed the limitations of evaluation and management by telemedicine and the availability of in person appointments. The patient expressed understanding and agreed to proceed.     I discussed the assessment and treatment plan with the patient. The patient was provided an opportunity to ask questions and all were answered. The patient agreed with the plan and demonstrated an understanding of the instructions.   The patient was advised to call back or seek an in-person evaluation if the symptoms worsen or if the condition fails to improve as anticipated.  I provided 30 minutes of non-face-to-face time during this encounter.   [name], LCSW   Participation Level: Active  Behavioral Response: CasualAlertAnxious and Depressed  Type of Therapy: Individual Therapy  Treatment Goals addressed:  Active     PTSD-Trauma Disorder CCP Problem  1 PTSD       Decrease PHQ-9 down 10  (Progressing)     Start:  08/18/22    Expected End:  06/11/23       Goal Note     Last score 12 current score 11          Decrease GAD-7 down by 10  (Not Progressing)     Start:  08/18/22    Expected End:  06/11/23         Gain income either through SSDI or work.  (Progressing)     Start:  08/18/22    Expected End:  06/11/23       Goal Note     Pt has applied and waiting for determination.          ENCOURAGE Gregory Bishop TO PRACTICE BREATHING RETRAINING FOR 10 MINUTES, 3 TIMES PER DAY (Completed)     Start:  08/18/22    End:  01/21/23      WORK WITH Gregory Bishop TO COMPLETE THE PHQ-9 DURING Gregory Bishop INDIVIDUAL SESSION (Completed)     Start:  08/18/22    End:  10/01/22      EDUCATE Gregory Bishop  ON COMMON REACTIONS TO A TRAUMATIC EXPERIENCE (Completed)     Start:  08/18/22    End:  11/18/22      WORK WITH Gregory Bishop TO CONSTRUCT AN "IN VIVO EXPOSURE HIERARCHY" A LIST OF THE SITUATIONS, PEOPLE, & PLACES THAT Gregory Bishop AVOIDS BECAUSE OF THEIR TRAUMA (Completed)     Start:  08/18/22    End:  11/18/22      ENCOURAGE Gregory Bishop TO PRACTICE ON IN VIVO EXPOSURE ASSIGNMENT BETWEEN INDIVIDUAL SESSIONS AND TRACK THEIR RESPONSE USING PRE-, POST-, AND PEAK- SUDS SCORE (Completed)     Start:  08/18/22    End:  11/18/22        ProgressTowards Goals: Progressing  Interventions: CBT and Motivational Interviewing   Suicidal/Homicidal: Nowithout intent/plan  Therapist Response:    Pt was alert and oriented x 5. Gregory Bishop was dressed casually and engaged well in therapy session. He presented with anxious mood/affect. He was pleasant, cooperative and maintained good eye contact.   Pt reports everything is going "well". When asked about stressors pt stated "Nothing really". Gregory Bishop reports compliance with medications. He states he sets an alarm to take them, and his roommate/caregiver sets up his pill box for  him. Pt does report things are getting better with his grieving over his neighbor who passed away last month. He reports no ETOH use currently.   Interventions/Plan: LCSW used supportive therapy for praise and encouragement. Pt reports he has applied for SSDI which is one of his goals indicated in treatment plan. LCSW educated pt on taking medications as prescribed. LCSW used motivational interviewing for pt positive affirmations in reference to his weekly attendance at [employer]   Plan: Return again in 4 weeks  Diagnosis: Bipolar 1 disorder, depressed (HCC)  PTSD (post-traumatic stress disorder)  Collaboration of Care: Other None today   Patient/Guardian was advised Release of Information must be obtained prior to any record release in order to collaborate their care with an outside provider.  Patient/Guardian was advised if they have not already done so to contact the registration department to sign all necessary forms in order for [place] to release information regarding their care.   Consent: Patient/Guardian gives verbal consent for treatment and assignment of benefits for services provided during this visit. Patient/Guardian expressed understanding and agreed to proceed.   [name], LCSW 04/22/2023

## 2023-04-28 ENCOUNTER — Other Ambulatory Visit (HOSPITAL_COMMUNITY): Payer: Self-pay | Admitting: Student in an Organized Health Care Education/Training Program

## 2023-04-28 ENCOUNTER — Other Ambulatory Visit: Payer: Self-pay

## 2023-04-28 DIAGNOSIS — R062 Wheezing: Secondary | ICD-10-CM

## 2023-04-29 ENCOUNTER — Encounter: Payer: Self-pay | Admitting: Neurology

## 2023-04-29 ENCOUNTER — Other Ambulatory Visit: Payer: Self-pay

## 2023-04-29 ENCOUNTER — Ambulatory Visit (INDEPENDENT_AMBULATORY_CARE_PROVIDER_SITE_OTHER): Payer: Medicaid Other | Admitting: Neurology

## 2023-04-29 VITALS — BP 121/78 | HR 71 | Ht 71.5 in | Wt 145.0 lb

## 2023-04-29 DIAGNOSIS — F09 Unspecified mental disorder due to known physiological condition: Secondary | ICD-10-CM

## 2023-04-29 DIAGNOSIS — S069X9S Unspecified intracranial injury with loss of consciousness of unspecified duration, sequela: Secondary | ICD-10-CM

## 2023-04-29 DIAGNOSIS — F1011 Alcohol abuse, in remission: Secondary | ICD-10-CM

## 2023-04-29 MED ORDER — ALBUTEROL SULFATE HFA 108 (90 BASE) MCG/ACT IN AERS
1.0000 | INHALATION_SPRAY | RESPIRATORY_TRACT | 0 refills | Status: DC | PRN
Start: 2023-04-29 — End: 2023-09-21
  Filled 2023-04-29: qty 18, 17d supply, fill #0

## 2023-04-29 MED ORDER — VITAMIN B-12 1000 MCG PO TABS
1000.0000 ug | ORAL_TABLET | Freq: Every day | ORAL | 3 refills | Status: DC
  Filled 2023-04-29: qty 90, 90d supply, fill #0

## 2023-04-29 MED ORDER — THIAMINE HCL 100 MG PO TABS
100.0000 mg | ORAL_TABLET | Freq: Every day | ORAL | 3 refills | Status: DC
  Filled 2023-04-29: qty 90, 90d supply, fill #0
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
## 2023-04-29 NOTE — Patient Instructions (Signed)
Continue current medications Start vitamin B12 and thiamine daily Continue with exercise Continue to abstain from alcohol Follow with your PCP Return as needed.

## 2023-04-29 NOTE — Telephone Encounter (Signed)
Received request for refill of patient's inhaler.  Will send one refill but all further refills need to be done by patient's PCP.   Sent: -Albuterol 1-2 puff q4 PRN.  1 inhaler    Arna Snipe MD Resident

## 2023-04-30 ENCOUNTER — Other Ambulatory Visit: Payer: Self-pay

## 2023-05-03 ENCOUNTER — Telehealth: Payer: Self-pay | Admitting: Neurology

## 2023-05-03 ENCOUNTER — Encounter: Payer: Self-pay | Admitting: Neurology
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
## 2023-05-03 NOTE — Telephone Encounter (Signed)
Pt friend called. Stated she needs pt notes changed, stated she is not his parnter but only his friend. Stated pt hasn't drunk in two years and she wants it change in is record.

## 2023-05-06 ENCOUNTER — Encounter (HOSPITAL_COMMUNITY): Payer: MEDICAID | Admitting: Student
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
## 2023-05-12 ENCOUNTER — Telehealth (HOSPITAL_COMMUNITY): Payer: Self-pay | Admitting: Student

## 2023-05-13 ENCOUNTER — Telehealth (HOSPITAL_COMMUNITY): Payer: Self-pay
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
## 2023-05-13 NOTE — Telephone Encounter (Signed)
Gregory Bishop, but roommate picked up the phone and started talking for Gregory Bishop. Did confirm that Gregory Bishop was there.  Roommate reported that there has been side effects to trileptal and will not be giving the medication to Gregory Bishop anymore. Stated that she has been giving it to him at 2pm rather at bedtime as suggested.  Again recommended that Gregory Bishop takes the medication as recommended until the appointment.

## 2023-05-17 ENCOUNTER — Ambulatory Visit (INDEPENDENT_AMBULATORY_CARE_PROVIDER_SITE_OTHER): Payer: MEDICAID | Admitting: Licensed Clinical Social Worker

## 2023-05-17 DIAGNOSIS — F319 Bipolar disorder, unspecified: Secondary | ICD-10-CM | POA: Diagnosis not present

## 2023-05-17 DIAGNOSIS — F4310 Post-traumatic stress disorder, unspecified: Secondary | ICD-10-CM

## 2023-05-17 NOTE — Progress Notes (Signed)
THERAPIST PROGRESS NOTE  Virtual Visit via Video Note  I connected with [name] on 05/17/23 at  2:00 PM EDT by a video enabled telemedicine application and verified that I am speaking with the correct person using two identifiers.  Location: Patient: [facility]  Provider:    I discussed the limitations of evaluation and management by telemedicine and the availability of in person appointments. The patient expressed understanding and agreed to proceed.    I discussed the assessment and treatment plan with the patient. The patient was provided an opportunity to ask questions and all were answered. The patient agreed with the plan and demonstrated an understanding of the instructions.   The patient was advised to call back or seek an in-person evaluation if the symptoms worsen or if the condition fails to improve as anticipated.  I provided 40 minutes of non-face-to-face time during this encounter.  [name], LCSW   Participation Level: Active  Behavioral Response: CasualAlertAnxious and Depressed  Type of Therapy: Individual Therapy  Treatment Goals addressed:  Active     PTSD-Trauma Disorder CCP Problem  1 PTSD       Decrease PHQ-9 down 10  (Progressing)     Start:  08/18/22    Expected End:  06/11/23       Goal Note     Last score 12 current score 11          Decrease GAD-7 down by 10  (Not Progressing)     Start:  08/18/22    Expected End:  06/11/23         Gain income either through SSDI or work.  (Progressing)     Start:  08/18/22    Expected End:  06/11/23         ENCOURAGE Demetrius TO PRACTICE BREATHING RETRAINING FOR 10 MINUTES, 3 TIMES PER DAY (Completed)     Start:  08/18/22    End:  01/21/23      WORK WITH Shizuo TO COMPLETE THE PHQ-9 DURING [facility] INDIVIDUAL SESSION (Completed)     Start:  08/18/22    End:  10/01/22      EDUCATE Jaevin ON COMMON REACTIONS TO A TRAUMATIC EXPERIENCE (Completed)     Start:  08/18/22    End:   11/18/22      WORK WITH Martez TO CONSTRUCT AN "IN VIVO EXPOSURE HIERARCHY" A LIST OF THE SITUATIONS, PEOPLE, & PLACES THAT Antonious AVOIDS BECAUSE OF THEIR TRAUMA (Completed)     Start:  08/18/22    End:  11/18/22      ENCOURAGE Khaalid TO PRACTICE ON IN VIVO EXPOSURE ASSIGNMENT BETWEEN INDIVIDUAL SESSIONS AND TRACK THEIR RESPONSE USING PRE-, POST-, AND PEAK- SUDS SCORE (Completed)     Start:  08/18/22    End:  11/18/22         ProgressTowards Goals: Progressing  Interventions: CBT, Motivational Interviewing, and Supportive    Suicidal/Homicidal: Nowithout intent/plan  Therapist Response:     Pt was alert and oriented x 5. He was pleasant, cooperative and maintained good eye contact.  Christopherjose presented with flat and anxious mood/affect. He engaged well and was dressed casually in therapy session.  Pt and caregiver were in session today. [name] pt caregiver was very upset with medications changes by new provider that took over from Dr. Renaldo Fiddler. Pt reports medication Trileptal made him manic. [name] reports that she understands Sufyaan needs medications but that was not the right one for him. She reports anger and irritability with  medication provider as she did not feel heard when she called to report that Jawon was showing manic and aggressive behavior. AEB pt screaming, cussing, and attempting to take the wheel of a moving vehicle. [name] also reports that all behavior stopped after stopping medication.  LCSW encouraged caregiver to advocate for pt, but make sure to do so properly and through the right channels. Pt does have another appointment with new medication provider on Aug 8th and [name] was agreeable to go in with an open mind. Tylique today reports that he is feeling much better since stopping medications. He reports still struggling with memory loss. Kainan does state a neurologist did Dx him with short term memory loss. Interventions/Plan: LCSW encouraged pt and caregiver  to track symptoms and side effects of medications. LCSW advocated for pt to have the right to self-determination, but it is best to do so by consulting with provider first. LCSW used positive affirmation and open-ended questions for motivational interviewing.  Plan: Return again in 3 weeks.  Diagnosis: Bipolar 1 disorder, depressed (HCC)  PTSD (post-traumatic stress disorder)  Collaboration of Care: Other None today   Patient/Guardian was advised Release of Information must be obtained prior to any record release in order to collaborate their care with an outside provider. Patient/Guardian was advised if they have not already done so to contact the registration department to sign all necessary forms in order for [place] to release information regarding their care.   Consent: Patient/Guardian gives verbal consent for treatment and assignment of benefits for services provided during this visit. Patient/Guardian expressed understanding and agreed to proceed.   [name], LCSW 05/17/2023

## 2023-05-20 ENCOUNTER — Encounter (HOSPITAL_COMMUNITY): Payer: MEDICAID | Admitting: Student

## 2023-05-20 NOTE — Progress Notes (Deleted)
BH MD Outpatient Progress Note  05/20/2023 11:31 AM Gregory Bishop  MRN: [id number]  Assessment:  Gregory Bishop presents for follow-up evaluation in-person. Today, 05/20/23, patient reports ***  Identifying Information: Gregory Bishop is a 47 y.o. male with a history of *** who is an established patient with [facility] for management of ***.   Risk Assessment: An assessment of suicide and violence risk factors was performed as part of this evaluation and is not *** significantly changed from the last visit.             While future psychiatric events cannot be accurately predicted, the patient does not *** currently require acute inpatient psychiatric care and does not *** currently meet [facility] involuntary commitment criteria.          Plan:  # *** Past medication trials:  Status of problem: *** Interventions: ***  # *** Past medication trials:  Status of problem: *** Interventions: ***  # *** Past medication trials:  Status of problem: *** Interventions: ***  Health Maintenance PCP: Loyola Mast, MD   Return to care in: Future Appointments  Date Time Provider Department Center  05/20/2023  2:30 PM Princess Bruins, DO [facility] None  06/08/2023  3:00 PM [name], LCSW [facility] None  07/06/2023  2:00 PM [name], LCSW [facility] None  08/09/2023  2:00 PM Loyola Mast, MD [facility]  09/27/2023  1:40 PM Veto Kemps, Bertram Millard, MD [facility]    Patient was given contact information for behavioral health clinic and was instructed to call 911 for emergencies.    Patient and plan of care will be discussed with the Attending MD, Dr. ***, who agrees with the above statement and plan.   Subjective:  Chief Complaint: No chief complaint on file.   Interval History:   Mood: "***"  Sleep: *** - *** hrs/night - *** issues falling asleep. - *** issues staying asleep. - *** issues with waking up too early.  Caffeine:  ***  Appetite: ***  EtOH: *** Nicotine: *** Cannabis: *** Other substances: ***  Patient amenable to *** after discussing the risks, benefits, and side effects. Otherwise patient had no other questions or concerns and was amenable to plan per above.  Safety: ***. Patient contracted to safety, stated they would call ***. Patient *** aware of [facility], 988 and 911 as well. *** access to guns or weapons.  Review of Systems  Visit Diagnosis: No diagnosis found.  Past Psychiatric History:  Diagnoses: *** Medication trials: *** Previous psychiatrist/therapist: *** Hospitalizations: *** ED/Urgent Care: *** Suicide attempts: *** SIB: *** Hx of violence towards others: *** Current access to guns: *** Hx of trauma/abuse: ***  Substance Use History: EtOH:  reports that he does not currently use alcohol.*** Nicotine:  reports that he has been smoking cigarettes. He has never used smokeless tobacco.*** Marijuana: *** IV drug use: *** Stimulants: *** Opiates: *** Sedative/hypnotics: *** Hallucinogens: *** DT: *** Detox: *** Residential: ***  Past Medical History: Dx:  has a past medical history of Stroke (HCC).  Head trauma: *** Seizures: *** Allergies: Patient has no known allergies.   Family Psychiatric History:  Suicide: *** Homicide: *** Psych hospitalization: *** BiPD: *** SCZ/SCzA: *** Substance use: *** Others: ***  Social History:  Housing: *** Income: *** Family: *** Education: *** Marital Status: *** Children: *** Support: *** Guns/Weapons: *** Legal: *** DUI/DWI: *** Jail/prison: *** Developmental: ***  Past Medical History:  Past Medical History:  Diagnosis Date  Stroke [facility])     Past Surgical History:  Procedure Laterality Date   LEG SURGERY Left 2014   Family History:  Family History  Problem Relation Age of Onset   Diabetes Maternal Uncle    Diabetes Maternal Grandfather    Social History   Socioeconomic History   Marital status:  Unknown    Spouse name: Not on file   Number of children: 2   Years of education: Not on file   Highest education level: 12th grade  Occupational History   Occupation: Unemployed  Tobacco Use   Smoking status: Every Day    Current packs/day: 0.25    Types: Cigarettes   Smokeless tobacco: Never  Vaping Use   Vaping status: Never Used  Substance and Sexual Activity   Alcohol use: Not Currently   Drug use: No   Sexual activity: Not Currently    Partners: Female    Comment: 1 partner with sig other  Other Topics Concern   Not on file  Social History Narrative   Not on file   Social Determinants of Health   Financial Resource Strain: Low Risk  (07/21/2021)   Overall Financial Resource Strain (CARDIA)    Difficulty of Paying Living Expenses: Not hard at all  Food Insecurity: No Food Insecurity (07/21/2021)   Hunger Vital Sign    Worried About Running Out of Food in the Last Year: Never true    Ran Out of Food in the Last Year: Never true  Transportation Needs: No Transportation Needs (07/21/2021)   PRAPARE - Administrator, Civil Service (Medical): No    Lack of Transportation (Non-Medical): No  Physical Activity: Sufficiently Active (07/21/2021)   Exercise Vital Sign    Days of Exercise per Week: 7 days    Minutes of Exercise per Session: 60 min  Stress: Stress Concern Present (07/21/2021)   [employer] of Occupational Health - Occupational Stress Questionnaire    Feeling of Stress : Rather much  Social Connections: Socially Isolated (07/21/2021)   Social Connection and Isolation Panel [NHANES]    Frequency of Communication with Friends and Family: Once a week    Frequency of Social Gatherings with Friends and Family: Never    Attends Religious Services: Never    Database administrator or Organizations: No    Attends Engineer, structural: Never    Marital Status: Living with partner    Allergies: No Known Allergies  Current  Medications: Current Outpatient Medications  Medication Sig Dispense Refill   acamprosate (CAMPRAL) 333 MG tablet Take 2 tablets (666 mg total) by mouth 3 (three) times daily with meals. 180 tablet 1   albuterol (VENTOLIN HFA) 108 (90 Base) MCG/ACT inhaler Inhale 1-2 puffs into the lungs every 4 (four) hours as needed for wheezing or shortness of breath. 18 g 0   cetirizine (ZYRTEC ALLERGY) 10 MG tablet Take 1 tablet (10 mg total) by mouth daily. 28 tablet 0   cyanocobalamin (VITAMIN B12) 1000 MCG tablet Take 1 tablet (1,000 mcg total) by mouth daily. 90 tablet 3   gabapentin (NEURONTIN) 100 MG capsule Take 1 capsule (100 mg total) by mouth 2 (two) times daily. 60 capsule 0   naltrexone (DEPADE) 50 MG tablet Take 1 tablet (50 mg total) by mouth at bedtime. 30 tablet 1   NON FORMULARY Take 1-2 tablets by mouth See admin instructions. Emergen-C Citrus-Ginger Gummies, Turmeric and Ginger, Immune Support gummies- Chew 1-2 gummies by mouth daily  OLANZapine (ZYPREXA) 10 MG tablet Take 1 tablet (10 mg total) by mouth at bedtime. 30 tablet 1   Oxcarbazepine (TRILEPTAL) 300 MG tablet Take 1 tablet (300 mg total) by mouth at bedtime. 30 tablet 0   sildenafil (VIAGRA) 50 MG tablet Take 1 tablet (50 mg total) by mouth daily as needed for erectile dysfunction. 10 tablet 11   thiamine (VITAMIN B1) 100 MG tablet Take 1 tablet (100 mg total) by mouth daily. 90 tablet 3   No current facility-administered medications for this visit.    Objective: Psychiatric Specialty Exam: There were no vitals taken for this visit.There is no height or weight on file to calculate BMI.  General Appearance: Casual, faily groomed  Eye Contact:  Good    Speech:  Clear, coherent, normal rate   Volume:  Normal   Mood:  "***"  Affect:  Appropriate, congruent, full range  Thought Content: Logical, rumination  Suicidal Thoughts: Denied active and passive SI ***   Thought Process:  Coherent, goal-directed, linear ***   Orientation:  A&Ox4   Memory:  Immediate good  Judgment:  Fair   Insight:  Fair ***  Concentration:  Attention and concentration good ***  Recall:  Good  Fund of Knowledge: Good  Language: Good, fluent  Psychomotor Activity: ***  Akathisia:  NA ***  AIMS (if indicated): NA ***  Assets:  {Assets (PAA):22698}  ADL's:  Intact  Cognition: WNL  Sleep:  ***    PE: General: well-appearing; no acute distress *** Pulm: no increased work of breathing on room air *** Strength & Muscle Tone: {desc; muscle tone:32375} Neuro: no focal neurological deficits observed *** Gait & Station: {PE GAIT ED ZOXW:96045} Physical Exam   Metabolic Disorder Labs: Lab Results  Component Value Date   HGBA1C 5.8 (H) 09/25/2022   MPG 120 09/25/2022   No results found for: "PROLACTIN" Lab Results  Component Value Date   CHOL 234 (H) 09/25/2022   TRIG 157 (H) 09/25/2022   HDL 59 09/25/2022   CHOLHDL 4.0 09/25/2022   VLDL 40.9 12/23/2021   LDLCALC 146 (H) 09/25/2022   LDLCALC 113 (H) 12/23/2021   Lab Results  Component Value Date   TSH 0.50 03/25/2022    Therapeutic Level Labs: No results found for: "LITHIUM" No results found for: "VALPROATE" No results found for: "CBMZ"  Screenings: CAGE-AID    Flowsheet Row ED to Hosp-Admission (Discharged) from 05/22/2021 in [facility] [place]  CAGE-AID Score 3      GAD-7    Flowsheet Row Counselor from 03/25/2023 in [facility] Counselor from 12/08/2022 in [facility] Counselor from 11/18/2022 in [facility] Counselor from 10/01/2022 in [facility] Counselor from 09/16/2022 in [facility]  Total GAD-7 Score 8 7 12 14 11       PHQ2-9    Flowsheet Row Office Visit from 03/26/2023 in [facility] [employer] Counselor from 03/25/2023 in [facility]  Office Visit from 02/05/2023 in [facility] [employer] Counselor from 12/08/2022 in [facility] Counselor from 11/18/2022 in [facility]  PHQ-2 Total Score 0 4 0 2 3  PHQ-9 Total Score -- 11 -- 10 12      Flowsheet Row Counselor from 08/18/2022 in [facility] Video Visit from 06/05/2022 in [facility] Video Visit from 04/03/2022  in [facility]  C-SSRS RISK CATEGORY No Risk No Risk No Risk       Collaboration of Care: Collaboration of Care: {BH OP Collaboration of Care:21014065}  Patient/Guardian was advised Release of Information must be obtained prior to any record release in order to collaborate their care with an outside provider. Patient/Guardian was advised if they have not already done so to contact the registration department to sign all necessary forms in order for [place] to release information regarding their care.   Consent: Patient/Guardian gives verbal consent for treatment and assignment of benefits for services provided during this visit. Patient/Guardian expressed understanding and agreed to proceed.   Princess Bruins, DO Psych Resident, PGY-3

## 2023-05-28 ENCOUNTER — Other Ambulatory Visit: Payer: Self-pay

## 2023-06-08 ENCOUNTER — Ambulatory Visit (INDEPENDENT_AMBULATORY_CARE_PROVIDER_SITE_OTHER): Payer: MEDICAID | Admitting: Licensed Clinical Social Worker

## 2023-06-08 DIAGNOSIS — F4310 Post-traumatic stress disorder, unspecified: Secondary | ICD-10-CM | POA: Diagnosis not present

## 2023-06-08 DIAGNOSIS — F319 Bipolar disorder, unspecified: Secondary | ICD-10-CM | POA: Diagnosis not present

## 2023-06-08 NOTE — Progress Notes (Signed)
THERAPIST PROGRESS NOTE  Virtual Visit via Video Note  I connected with Gregory Bishop on 06/08/23 at  3:00 PM EDT by a video enabled telemedicine application and verified that I am speaking with the correct person using two identifiers.  Location: Patient: Gregory Bishop  Provider: Provider Home    I discussed the limitations of evaluation and management by telemedicine and the availability of in person appointments. The patient expressed understanding and agreed to proceed.     I discussed the assessment and treatment plan with the patient. The patient was provided an opportunity to ask questions and all were answered. The patient agreed with the plan and demonstrated an understanding of the instructions.   The patient was advised to call back or seek an in-person evaluation if the symptoms worsen or if the condition fails to improve as anticipated.  I provided 30 minutes of non-face-to-face time during this encounter.   [name], LCSW   Participation Level: Active  Behavioral Response: CasualAlertAnxious and Depressed  Type of Therapy: Individual Therapy  Treatment Goals addressed:  Active     PTSD-Trauma Disorder CCP Problem  1 PTSD       Decrease PHQ-9 down 10  (Not Progressing)     Start:  08/18/22    Expected End:  06/11/23         Decrease GAD-7 down by 10  (Not Progressing)     Start:  08/18/22    Expected End:  06/11/23         Gain income either through SSDI or work.  (Progressing)     Start:  08/18/22    Expected End:  06/11/23         ENCOURAGE Gregory Bishop TO PRACTICE BREATHING RETRAINING FOR 10 MINUTES, 3 TIMES PER DAY (Completed)     Start:  08/18/22    End:  01/21/23      WORK WITH Gregory Bishop TO COMPLETE THE PHQ-9 DURING Gregory Bishop INDIVIDUAL SESSION (Completed)     Start:  08/18/22    End:  10/01/22      EDUCATE Gregory Bishop ON COMMON REACTIONS TO A TRAUMATIC EXPERIENCE (Completed)     Start:  08/18/22    End:  11/18/22      WORK WITH Gregory Bishop TO  CONSTRUCT AN "IN VIVO EXPOSURE HIERARCHY" A LIST OF THE SITUATIONS, PEOPLE, & PLACES THAT Gregory Bishop AVOIDS BECAUSE OF THEIR TRAUMA (Completed)     Start:  08/18/22    End:  11/18/22      ENCOURAGE Gregory Bishop TO PRACTICE ON IN VIVO EXPOSURE ASSIGNMENT BETWEEN INDIVIDUAL SESSIONS AND TRACK THEIR RESPONSE USING PRE-, POST-, AND PEAK- SUDS SCORE (Completed)     Start:  08/18/22    End:  11/18/22            ProgressTowards Goals: Progressing  Interventions: CBT, Motivational Interviewing, and Supportive   Suicidal/Homicidal: Nowithout intent/plan  Therapist Response:   Pt as alert and oriented x 5. He was dressed casually and engaged well in therapy session. He was pleasant, cooperative and maintained good eye contact. Gregory Bishop presented with anxious mood/affect.  LCSW administered a PHQ-9. LCSW administered a GAD-7. LCSW reviewed increase in scores. Both pt and caregiver reports an overall decrease in symptoms since stop Trileptal. Pt reports that he spends most days playing video games, collecting fast and the furious model cars, and going for walks. Pt reports that he continues to look for employment but struggles due to going to AA multiple times per week.  Interventions/Plan: LCSW administered a  PHQ-9 and GAD-7. LCSW notes both goals not progressing. LCSW educated pt on taking medications as prescribed. LCSW used supportive therapy for praise and encouragement. LCSW used psychoanalytic therapy for pt to express thoughts, feelings and concerns using free association.       Plan: Return again in 3 weeks.  Diagnosis: Bipolar 1 disorder, depressed (HCC)  PTSD (post-traumatic stress disorder)  Collaboration of Care: Other None today   Patient/Guardian was advised Release of Information must be obtained prior to any record release in order to collaborate their care with an outside provider. Patient/Guardian was advised if they have not already done so to contact the registration department to  sign all necessary forms in order for [place] to release information regarding their care.   Consent: Patient/Guardian gives verbal consent for treatment and assignment of benefits for services provided during this visit. Patient/Guardian expressed understanding and agreed to proceed.   [name], LCSW 06/08/2023

## 2023-06-10 ENCOUNTER — Encounter (HOSPITAL_COMMUNITY): Payer: MEDICAID | Admitting: Student

## 2023-06-10 ENCOUNTER — Ambulatory Visit (INDEPENDENT_AMBULATORY_CARE_PROVIDER_SITE_OTHER): Payer: MEDICAID | Admitting: Student

## 2023-06-10 VITALS — BP 109/77 | HR 73 | Ht 70.47 in | Wt 144.0 lb

## 2023-06-10 DIAGNOSIS — F1021 Alcohol dependence, in remission: Secondary | ICD-10-CM | POA: Diagnosis not present

## 2023-06-10 DIAGNOSIS — F4310 Post-traumatic stress disorder, unspecified: Secondary | ICD-10-CM | POA: Diagnosis not present
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
## 2023-06-11 ENCOUNTER — Encounter (HOSPITAL_COMMUNITY): Payer: Self-pay | Admitting: Student

## 2023-06-11 MED ORDER — NALTREXONE HCL 50 MG PO TABS
50.0000 mg | ORAL_TABLET | Freq: Every day | ORAL | 1 refills | Status: DC
  Filled 2023-06-11 – 2023-06-21 (×2): qty 30, 30d supply, fill #0
  Filled ????-??-??: fill #0

## 2023-06-11 MED ORDER — OLANZAPINE 10 MG PO TABS
10.0000 mg | ORAL_TABLET | Freq: Every day | ORAL | 1 refills | Status: DC
  Filled 2023-06-11: qty 30, 30d supply, fill #0
  Filled ????-??-??: fill #0

## 2023-06-11 MED ORDER — ACAMPROSATE CALCIUM 333 MG PO TBEC
666.0000 mg | DELAYED_RELEASE_TABLET | Freq: Three times a day (TID) | ORAL | 1 refills | Status: DC
Start: 2023-06-11 — End: 2023-07-08
  Filled 2023-06-11 – 2023-07-07 (×3): qty 180, 30d supply, fill #0

## 2023-06-11 MED ORDER — THIAMINE HCL 100 MG PO TABS
100.0000 mg | ORAL_TABLET | Freq: Every day | ORAL | 3 refills | Status: DC
Start: 2023-06-11 — End: 2023-07-08
  Filled 2023-06-11: qty 100, 100d supply, fill #0

## 2023-06-11 MED ORDER — VITAMIN B-12 1000 MCG PO TABS
1000.0000 ug | ORAL_TABLET | Freq: Every day | ORAL | 3 refills | Status: DC
  Filled 2023-06-11: qty 90, 90d supply, fill #0

## 2023-06-11 MED ORDER — GABAPENTIN 100 MG PO CAPS
100.0000 mg | ORAL_CAPSULE | Freq: Three times a day (TID) | ORAL | 0 refills | Status: DC
  Filled 2023-06-11: qty 90, 30d supply, fill #0

## 2023-06-15 ENCOUNTER — Other Ambulatory Visit: Payer: Self-pay

## 2023-06-17 NOTE — Addendum Note (Signed)
Addended by: Theodoro Kos A on: 06/17/2023 04:19 PM   Modules accepted: Level of Service

## 2023-06-21 ENCOUNTER — Other Ambulatory Visit: Payer: Self-pay

## 2023-07-06 ENCOUNTER — Ambulatory Visit (HOSPITAL_COMMUNITY): Payer: Medicare (Managed Care) | Admitting: Licensed Clinical Social Worker

## 2023-07-06 ENCOUNTER — Encounter (HOSPITAL_COMMUNITY): Payer: Self-pay | Admitting: Licensed Clinical Social Worker

## 2023-07-06 DIAGNOSIS — F319 Bipolar disorder, unspecified: Secondary | ICD-10-CM

## 2023-07-06 NOTE — Progress Notes (Signed)
THERAPIST PROGRESS NOTE  Virtual Visit via Video Note  I connected with Gregory Bishop on 07/06/23 at  2:00 PM EDT by a video enabled telemedicine application and verified that I am speaking with the correct person using two identifiers.  Location: Patient: [facility]  Provider: Provider Home    I discussed the limitations of evaluation and management by telemedicine and the availability of in person appointments. The patient expressed understanding and agreed to proceed.    I discussed the assessment and treatment plan with the patient. The patient was provided an opportunity to ask questions and all were answered. The patient agreed with the plan and demonstrated an understanding of the instructions.   The patient was advised to call back or seek an in-person evaluation if the symptoms worsen or if the condition fails to improve as anticipated.  I provided 30 minutes of non-face-to-face time during this encounter.   Gregory Cooks, Gregory Bishop   Participation Level: Active  Behavioral Response: CasualAlertAnxious and Depressed  Type of Therapy: Individual Therapy  Treatment Goals addressed:  Active     PTSD-Trauma Disorder CCP Problem  1 PTSD       Decrease PHQ-9 down 10  (Progressing)     Start:  08/18/22    Expected End:  12/10/23         Decrease GAD-7 down by 10  (Progressing)     Start:  08/18/22    Expected End:  12/10/23         Gain income either through SSDI or work.  (Progressing)     Start:  08/18/22    Expected End:  12/10/23         ENCOURAGE Gregory Bishop TO PRACTICE BREATHING RETRAINING FOR 10 MINUTES, 3 TIMES PER DAY (Completed)     Start:  08/18/22    End:  01/21/23      WORK WITH Gregory Bishop TO COMPLETE THE PHQ-9 DURING [facility] INDIVIDUAL SESSION (Completed)     Start:  08/18/22    End:  10/01/22      EDUCATE Gregory Bishop ON COMMON REACTIONS TO A TRAUMATIC EXPERIENCE (Completed)     Start:  08/18/22    End:  11/18/22      WORK WITH Gregory Bishop TO CONSTRUCT  AN "IN VIVO EXPOSURE HIERARCHY" A LIST OF THE SITUATIONS, PEOPLE, & PLACES THAT Gregory Bishop AVOIDS BECAUSE OF THEIR TRAUMA (Completed)     Start:  08/18/22    End:  11/18/22      ENCOURAGE Gregory Bishop TO PRACTICE ON IN VIVO EXPOSURE ASSIGNMENT BETWEEN INDIVIDUAL SESSIONS AND TRACK THEIR RESPONSE USING PRE-, POST-, AND PEAK- SUDS SCORE (Completed)     Start:  08/18/22    End:  11/18/22           07/06/2023    2:16 PM 06/08/2023    3:19 PM 03/26/2023    2:05 PM 03/25/2023    1:27 PM 02/05/2023    3:06 PM  Depression screen PHQ 2/9  Decreased Interest 2 3 0 3 0  Down, Depressed, Hopeless 0 1 0 1 0  PHQ - 2 Score 2 4 0 4 0  Altered sleeping 3 3  3    Tired, decreased energy 1 3  2    Change in appetite 3 1  0   Feeling bad or failure about yourself  2 1  1    Trouble concentrating 0 1  0   Moving slowly or fidgety/restless 1 3  1    Suicidal thoughts 0 0  0  PHQ-9 Score 12 16  11    Difficult doing work/chores Somewhat difficult Somewhat difficult  Somewhat difficult         07/06/2023    2:22 PM 06/08/2023    3:16 PM 03/25/2023    1:30 PM 12/08/2022    2:29 PM  GAD 7 : Generalized Anxiety Score  Nervous, Anxious, on Edge 1 2 2 2   Control/stop worrying 3 2 1  0  Worry too much - different things 3 2 3  0  Trouble relaxing 0 0 1 3  Restless 1 1 0 0  Easily annoyed or irritable 0 1 1 2   Afraid - awful might happen 0 1 0 0  Total GAD 7 Score 8 9 8 7   Anxiety Difficulty Somewhat difficult Somewhat difficult Very difficult Somewhat difficult     ProgressTowards Goals: Progressing  Interventions: CBT, Motivational Interviewing, and Supportive   Suicidal/Homicidal: Nowithout intent/plan  Therapist Response:     Pt was alert and oriented x 5. He was dressed casually and engaged well in therapy session. He presented with depressed and anxious mood/affect. Gregory Bishop was pleasant, cooperative, and maintained good eye contact.   Pt reports that things have been better with his mood. He reports  limited to no reported mood swings. He states his sleep and appetite have by fluctuating. Gregory Bishop reports overall he is doing good with taking his medications consistently and as prescribed. Pt reports going to AA meeting weekly. He has been also more engaged around the house for chores with him and the friend he lives with. Gregory Bishop reports no new stressors currently.   Interventions/Plan: Gregory Bishop administered a GAD-7. Gregory Bishop administered a PHQ-9. Gregory Bishop notes a decrease in both, and number are progressing towards goals listed in treatment plan above. Gregory Bishop used supportive therapy for praise and encouragement. Gregory Bishop used open ended questions and positive affirmations for motivational interviewing.    Plan: Return again in 4 weeks.  Diagnosis: No diagnosis found.  Collaboration of Care: Other None today   Patient/Guardian was advised Release of Information must be obtained prior to any record release in order to collaborate their care with an outside provider. Patient/Guardian was advised if they have not already done so to contact the registration department to sign all necessary forms in order for [place] to release information regarding their care.   Consent: Patient/Guardian gives verbal consent for treatment and assignment of benefits for services provided during this visit. Patient/Guardian expressed understanding and agreed to proceed.   Gregory Cooks, Gregory Bishop 07/06/2023

## 2023-07-07 ENCOUNTER — Telehealth (HOSPITAL_COMMUNITY): Payer: Self-pay | Admitting: Student

## 2023-07-07 ENCOUNTER — Other Ambulatory Visit: Payer: Self-pay

## 2023-07-07 DIAGNOSIS — F1021 Alcohol dependence, in remission: Secondary | ICD-10-CM

## 2023-07-07 DIAGNOSIS — F4310 Post-traumatic stress disorder, unspecified: Secondary | ICD-10-CM

## 2023-07-08 ENCOUNTER — Encounter (HOSPITAL_COMMUNITY): Payer: MEDICAID | Admitting: Student

## 2023-07-08 MED ORDER — ACAMPROSATE CALCIUM 333 MG PO TBEC
666.0000 mg | DELAYED_RELEASE_TABLET | Freq: Three times a day (TID) | ORAL | 1 refills | Status: DC
  Filled 2023-08-08: qty 180, 30d supply, fill #0
  Filled ????-??-??: fill #0

## 2023-07-08 MED ORDER — OLANZAPINE 10 MG PO TABS
10.0000 mg | ORAL_TABLET | Freq: Every day | ORAL | 1 refills | Status: DC
  Filled 2023-07-08: qty 30, 30d supply, fill #0
  Filled 2023-08-08: qty 30, 30d supply, fill #1

## 2023-07-08 MED ORDER — GABAPENTIN 100 MG PO CAPS
100.0000 mg | ORAL_CAPSULE | Freq: Three times a day (TID) | ORAL | 1 refills | Status: DC
  Filled 2023-07-08: qty 90, 30d supply, fill #0

## 2023-07-08 MED ORDER — THIAMINE HCL 100 MG PO TABS
100.0000 mg | ORAL_TABLET | Freq: Every day | ORAL | 1 refills | Status: DC
  Filled 2023-07-08 – 2023-08-08 (×2): qty 30, 30d supply, fill #0

## 2023-07-08 MED ORDER — NALTREXONE HCL 50 MG PO TABS
50.0000 mg | ORAL_TABLET | Freq: Every day | ORAL | 1 refills | Status: DC
  Filled 2023-07-08 – 2023-07-13 (×2): qty 30, 30d supply, fill #0
  Filled 2023-08-08: qty 30, 30d supply, fill #1

## 2023-07-08 NOTE — Telephone Encounter (Signed)
Recent Visits Date Type Provider Dept  06/10/23 Clinical Support Princess Bruins, DO [facility]  Showing recent visits within past 90 days and meeting all other requirements Future Appointments Date Type Provider Dept  08/03/23 Appointment [name], LCSW [facility]  08/30/23 Appointment [name], [place] [facility]  09/03/23 Appointment Princess Bruins, DO [facility]  Showing future appointments within next 90 days and meeting all other requirements   Refilled patient's rx per request to bridge until next appointment:  Alcohol use disorder, severe, in sustained remission (HCC) -     Gabapentin; Take 1 capsule (100 mg total) by mouth 3 (three) times daily.  Dispense: 90 capsule; Refill: 1 -     OLANZapine; Take 1 tablet (10 mg total) by mouth at bedtime.  Dispense: 30 tablet; Refill: 1 -     Acamprosate Calcium; Take 2 tablets (666 mg total) by mouth 3 (three) times daily with meals.  Dispense: 180 tablet; Refill: 1 -     Naltrexone HCl; Take 1 tablet (50 mg total) by mouth at bedtime.  Dispense: 30 tablet; Refill: 1 -     Thiamine HCl; Take 1 tablet (100 mg total) by mouth daily.  Dispense: 30 tablet; Refill: 1  PTSD (post-traumatic stress disorder) -     OLANZapine; Take 1 tablet (10 mg total) by mouth at bedtime.  Dispense: 30 tablet; Refill: 1

## 2023-07-09 ENCOUNTER — Other Ambulatory Visit: Payer: Self-pay

## 2023-07-14 ENCOUNTER — Other Ambulatory Visit: Payer: Self-pay

## 2023-07-16 ENCOUNTER — Other Ambulatory Visit: Payer: Self-pay

## 2023-08-03 ENCOUNTER — Ambulatory Visit (INDEPENDENT_AMBULATORY_CARE_PROVIDER_SITE_OTHER): Payer: MEDICAID | Admitting: Licensed Clinical Social Worker

## 2023-08-03 DIAGNOSIS — F4310 Post-traumatic stress disorder, unspecified: Secondary | ICD-10-CM

## 2023-08-03 DIAGNOSIS — F39 Unspecified mood [affective] disorder: Secondary | ICD-10-CM | POA: Diagnosis not present

## 2023-08-03 DIAGNOSIS — Z8659 Personal history of other mental and behavioral disorders: Secondary | ICD-10-CM

## 2023-08-03 NOTE — Progress Notes (Signed)
THERAPIST PROGRESS NOTE  Virtual Visit via Video Note  I connected with Gregory Bishop on 08/03/23 at  3:00 PM EDT by a video enabled telemedicine application and verified that I am speaking with the correct person using two identifiers.  Location: Patient: Gregory Bishop  Provider: Providers Home    I discussed the limitations of evaluation and management by telemedicine and the availability of in person appointments. The patient expressed understanding and agreed to proceed.   I discussed the assessment and treatment plan with the patient. The patient was provided an opportunity to ask questions and all were answered. The patient agreed with the plan and demonstrated an understanding of the instructions.   The patient was advised to call back or seek an in-person evaluation if the symptoms worsen or if the condition fails to improve as anticipated.  I provided 30 minutes of non-face-to-face time during this encounter.   Gregory Cooks, LCSW   Participation Level: Active  Behavioral Response: CasualAlertAnxious and Depressed  Type of Therapy: Individual Therapy  Treatment Goals addressed:  Active     PTSD-Trauma Disorder CCP Problem  1 PTSD       Decrease PHQ-9 down 10  (Progressing)     Start:  08/18/22    Expected End:  12/10/23         Decrease GAD-7 down by 10  (Progressing)     Start:  08/18/22    Expected End:  12/10/23         Gain income either through SSDI or work.  (Progressing)     Start:  08/18/22    Expected End:  12/10/23         ENCOURAGE Gregory Bishop TO PRACTICE BREATHING RETRAINING FOR 10 MINUTES, 3 TIMES PER DAY (Completed)     Start:  08/18/22    End:  01/21/23      WORK WITH Gregory Bishop TO COMPLETE THE PHQ-9 DURING [name] (Completed)     Start:  08/18/22    End:  10/01/22      EDUCATE Gregory Bishop ON COMMON REACTIONS TO A TRAUMATIC EXPERIENCE (Completed)     Start:  08/18/22    End:  11/18/22      WORK WITH Gregory Bishop TO CONSTRUCT  AN "IN VIVO EXPOSURE HIERARCHY" A LIST OF THE SITUATIONS, PEOPLE, & PLACES THAT Gregory Bishop AVOIDS BECAUSE OF THEIR TRAUMA (Completed)     Start:  08/18/22    End:  11/18/22      ENCOURAGE Gregory Bishop TO PRACTICE ON IN VIVO EXPOSURE ASSIGNMENT BETWEEN INDIVIDUAL SESSIONS AND TRACK THEIR RESPONSE USING PRE-, POST-, AND PEAK- SUDS SCORE (Completed)     Start:  08/18/22    End:  11/18/22         ProgressTowards Goals: Progressing  Interventions: CBT and Motivational Interviewing  Suicidal/Homicidal: Nowithout intent/plan  Therapist Response:   Patient oriented x 5.  Patient was pleasant, cooperative, engaged, session was dressed casually.  He presented today with euthymic mood\affect.  He engaged well in therapy session was dressed casually. Gregory Bishop reports stabilize mood while taking medications that are prescribed by psychiatrist at [name].  Patient reports decreased irritability, tension, and mood swings.  Patient voices that he is upset that he has to keep coming in for medication management appointments.  Patient reports primary stressors are transportation as he depends on his roommate Gregory Bishop.  Gregory Bishop reports that she is not always available to take him and frequently and will need to reschedule.  LCSW encouraged patient  to discuss this matter with the medication provider moving forward.   Other stressors for patient are grief and loss.  Patient reports that he lost his maternal uncle.  Gregory Bishop reports that he did not know this" well and family members expected him to be "more sad and crying".  Gregory Bishop reports today that he was unable to do that because he feels "I really did not know him". Interventions\plan: LCSW psycho analytic therapy for patient to express thoughts, feelings and concerns in session.  LCSW supportive therapy for praise and encouragement.  LCSW used unconditional positive regard utilizing nonjudgmental stance.  LCSW used person centered therapy for  empowerment.  LCSW encourage patient to track's symptoms and side effects of mental health diagnoses and medications being taken.    Plan: Return again in 3 weeks.  Diagnosis: Unspecified mood (affective) disorder (HCC)  History of nightmares  PTSD (post-traumatic stress disorder)  Collaboration of Care: Other    Patient/Guardian was advised Release of Information must be obtained prior to any record release in order to collaborate their care with an outside provider. Patient/Guardian was advised if they have not already done so to contact the registration department to sign all necessary forms in order for [place] to release information regarding their care.   Consent: Patient/Guardian gives verbal consent for treatment and assignment of benefits for services provided during this visit. Patient/Guardian expressed understanding and agreed to proceed.   Gregory Cooks, LCSW 08/03/2023

## 2023-08-09 ENCOUNTER — Other Ambulatory Visit: Payer: Self-pay

## 2023-08-09 ENCOUNTER — Ambulatory Visit: Payer: Self-pay | Admitting: Family Medicine

## 2023-08-10 ENCOUNTER — Other Ambulatory Visit: Payer: Self-pay

## 2023-08-13 ENCOUNTER — Ambulatory Visit: Payer: Self-pay | Admitting: Family Medicine

## 2023-08-13 ENCOUNTER — Telehealth: Payer: Self-pay | Admitting: Family Medicine

## 2023-08-13 ENCOUNTER — Other Ambulatory Visit: Payer: Self-pay

## 2023-08-13 NOTE — Telephone Encounter (Signed)
Pt called in to re-sch. Said he doesn't have a ride to bring him down. Scheduled for a future date. Pls advise if No show letter should be sent

## 2023-08-16 ENCOUNTER — Other Ambulatory Visit: Payer: Self-pay

## 2023-08-17 ENCOUNTER — Encounter: Payer: Self-pay | Admitting: Family Medicine

## 2023-08-17 NOTE — Telephone Encounter (Signed)
3 same day cancellations for lack of transportation 01/25/2023, 08/09/2023, and 08/13/2023  Final warning letter sent via mail and mychart. Pt rescheduled for 12/6

## 2023-08-18 ENCOUNTER — Other Ambulatory Visit: Payer: Self-pay

## 2023-08-24 ENCOUNTER — Telehealth (HOSPITAL_COMMUNITY): Payer: Self-pay | Admitting: Licensed Clinical Social Worker

## 2023-08-24 NOTE — Telephone Encounter (Signed)
Notified patient of appointment on December 17 being changed to December 27 at 10 AM virtual.  Dohn was agreeable front of staff was notified

## 2023-08-30 ENCOUNTER — Ambulatory Visit (INDEPENDENT_AMBULATORY_CARE_PROVIDER_SITE_OTHER): Payer: MEDICAID | Admitting: Licensed Clinical Social Worker

## 2023-08-30 DIAGNOSIS — F1021 Alcohol dependence, in remission: Secondary | ICD-10-CM | POA: Diagnosis not present

## 2023-08-30 DIAGNOSIS — F4310 Post-traumatic stress disorder, unspecified: Secondary | ICD-10-CM | POA: Diagnosis not present

## 2023-08-30 NOTE — Progress Notes (Signed)
THERAPIST PROGRESS NOTE  Virtual Visit via Video Note  I connected with [name] on 08/30/23 at  1:00 PM EST by a video enabled telemedicine application and verified that I am speaking with the correct person using two identifiers.  Location: Patient: [facility]  Provider: Providers Home    I discussed the limitations of evaluation and management by telemedicine and the availability of in person appointments. The patient expressed understanding and agreed to proceed.  I discussed the assessment and treatment plan with the patient. The patient was provided an opportunity to ask questions and all were answered. The patient agreed with the plan and demonstrated an understanding of the instructions.   The patient was advised to call back or seek an in-person evaluation if the symptoms worsen or if the condition fails to improve as anticipated.  I provided 30 minutes of non-face-to-face time during this encounter.   [name], LCSW   Participation Level: Active  Behavioral Response: CasualAlertAnxious and Depressed  Type of Therapy: Individual Therapy  Treatment Goals addressed:  Active     PTSD-Trauma Disorder CCP Problem  1 PTSD       Decrease PHQ-9 down 10  (Progressing)     Start:  08/18/22    Expected End:  12/10/23         Decrease GAD-7 down by 10  (Progressing)     Start:  08/18/22    Expected End:  12/10/23         Gain income either through SSDI or work.  (Progressing)     Start:  08/18/22    Expected End:  12/10/23         ENCOURAGE Flora TO PRACTICE BREATHING RETRAINING FOR 10 MINUTES, 3 TIMES PER DAY (Completed)     Start:  08/18/22    End:  01/21/23      WORK WITH Braven TO COMPLETE THE PHQ-9 DURING [facility] INDIVIDUAL SESSION (Completed)     Start:  08/18/22    End:  10/01/22      EDUCATE Carel ON COMMON REACTIONS TO A TRAUMATIC EXPERIENCE (Completed)     Start:  08/18/22    End:  11/18/22      WORK WITH Alberto TO CONSTRUCT AN  "IN VIVO EXPOSURE HIERARCHY" A LIST OF THE SITUATIONS, PEOPLE, & PLACES THAT Kodah AVOIDS BECAUSE OF THEIR TRAUMA (Completed)     Start:  08/18/22    End:  11/18/22      ENCOURAGE Malaquias TO PRACTICE ON IN VIVO EXPOSURE ASSIGNMENT BETWEEN INDIVIDUAL SESSIONS AND TRACK THEIR RESPONSE USING PRE-, POST-, AND PEAK- SUDS SCORE (Completed)     Start:  08/18/22    End:  11/18/22         ProgressTowards Goals: Progressing  Interventions: CBT and Motivational Interviewing  Summary: Beulah Soppe is a 47 y.o. male who presents with   Suicidal/Homicidal: Nowithout intent/plan  Therapist Response:    Patient was alert and oriented x 5.  He was pleasant, cooperative, maintained good eye contact.  Vihas presented with depressed and anxious mood\affect.  He was dressed casually and engaged well in therapy session.  Patient reports primary stressors grief and loss.  Patient reports that he has been struggling with multiple deaths over the last 6 months from his neighbor to one of his friends that he met working multiple years ago.  Patient reports that this has been hard on him emotionally.  He reports sadness during his time grieving.  Other stressors for patient are financial.  He reports that he is still waiting for determination for Social Security disability.  He reports he would like to try to find employment but has been unsuccessful to this date.  Patient reports primary barriers to employment have been his memory loss and engaging and alcohol Anonymous meetings.  Mack reports frustration due to lack of financial and housing independence.  Intervention\plan: LCSW supportive therapy for praise and encouragement.  LCSW educated patient on taking medications on time and as prescribed.  Patient does report taking medications consistently due to having alarms set on his phone and having [name] prepackaged his medications in a pill container.  LCSW educated patient on the stages of grief and loss 1  being sadness.  LCSW utilized unconditional positive regard utilizing nonjudgmental stance.    Plan: Return again in 3 weeks.  Diagnosis: Alcohol use disorder, severe, in early remission [facility])  PTSD (post-traumatic stress disorder)  Collaboration of Care: Other None today   Patient/Guardian was advised Release of Information must be obtained prior to any record release in order to collaborate their care with an outside provider. Patient/Guardian was advised if they have not already done so to contact the registration department to sign all necessary forms in order for [place] to release information regarding their care.   Consent: Patient/Guardian gives verbal consent for treatment and assignment of benefits for services provided during this visit. Patient/Guardian expressed understanding and agreed to proceed.   [name], LCSW 08/30/2023

## 2023-09-03 ENCOUNTER — Encounter (HOSPITAL_COMMUNITY): Payer: Self-pay | Admitting: Student

## 2023-09-03 ENCOUNTER — Ambulatory Visit (INDEPENDENT_AMBULATORY_CARE_PROVIDER_SITE_OTHER): Payer: MEDICAID | Admitting: Student

## 2023-09-03 ENCOUNTER — Other Ambulatory Visit: Payer: Self-pay

## 2023-09-03 VITALS — BP 109/91 | HR 73 | Ht 70.67 in | Wt 142.0 lb

## 2023-09-03 DIAGNOSIS — F4310 Post-traumatic stress disorder, unspecified: Secondary | ICD-10-CM

## 2023-09-03 DIAGNOSIS — F17200 Nicotine dependence, unspecified, uncomplicated: Secondary | ICD-10-CM | POA: Diagnosis not present

## 2023-09-03 DIAGNOSIS — F39 Unspecified mood [affective] disorder: Secondary | ICD-10-CM | POA: Diagnosis not present

## 2023-09-03 DIAGNOSIS — F1021 Alcohol dependence, in remission: Secondary | ICD-10-CM

## 2023-09-03 MED ORDER — THIAMINE HCL 100 MG PO TABS
100.0000 mg | ORAL_TABLET | Freq: Every day | ORAL | 0 refills | Status: DC
  Filled 2023-09-03 – 2023-11-12 (×4): qty 60, 60d supply, fill #0
  Filled ????-??-??: fill #0

## 2023-09-03 MED ORDER — VITAMIN B-12 1000 MCG PO TABS
1000.0000 ug | ORAL_TABLET | Freq: Every day | ORAL | 0 refills | Status: DC
  Filled 2023-09-03 – 2023-09-21 (×2): qty 60, 60d supply, fill #0

## 2023-09-03 MED ORDER — GABAPENTIN 100 MG PO CAPS
100.0000 mg | ORAL_CAPSULE | Freq: Every day | ORAL | 0 refills | Status: DC | PRN
  Filled 2023-09-03: qty 30, 30d supply, fill #0

## 2023-09-03 MED ORDER — ACAMPROSATE CALCIUM 333 MG PO TBEC
666.0000 mg | DELAYED_RELEASE_TABLET | Freq: Three times a day (TID) | ORAL | 0 refills | Status: DC
Start: 2023-09-03 — End: 2023-10-24
  Filled 2023-09-03 – 2023-09-21 (×2): qty 360, 60d supply, fill #0

## 2023-09-03 MED ORDER — MIRTAZAPINE 15 MG PO TABS
15.0000 mg | ORAL_TABLET | Freq: Every day | ORAL | 0 refills | Status: DC
  Filled 2023-09-03 – 2023-09-21 (×2): qty 60, 60d supply, fill #0

## 2023-09-03 MED ORDER — NALTREXONE HCL 50 MG PO TABS
50.0000 mg | ORAL_TABLET | Freq: Every day | ORAL | 0 refills | Status: DC
  Filled 2023-09-03 – 2023-09-21 (×2): qty 60, 60d supply, fill #0
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
## 2023-09-15 ENCOUNTER — Other Ambulatory Visit: Payer: Self-pay

## 2023-09-17 ENCOUNTER — Encounter: Payer: Self-pay | Admitting: Family Medicine

## 2023-09-17 ENCOUNTER — Ambulatory Visit (INDEPENDENT_AMBULATORY_CARE_PROVIDER_SITE_OTHER): Payer: MEDICAID | Admitting: Family Medicine

## 2023-09-17 VITALS — BP 112/68 | HR 84 | Temp 97.5°F | Ht 70.5 in | Wt 143.8 lb

## 2023-09-17 DIAGNOSIS — E559 Vitamin D deficiency, unspecified: Secondary | ICD-10-CM

## 2023-09-17 DIAGNOSIS — F319 Bipolar disorder, unspecified: Secondary | ICD-10-CM

## 2023-09-17 DIAGNOSIS — F17200 Nicotine dependence, unspecified, uncomplicated: Secondary | ICD-10-CM | POA: Diagnosis not present

## 2023-09-17 DIAGNOSIS — F1021 Alcohol dependence, in remission: Secondary | ICD-10-CM | POA: Diagnosis not present

## 2023-09-17 NOTE — Assessment & Plan Note (Signed)
Maintaining sobriety for the past 8 months. I strongly encourage ongoing counseling, attendance of AA, and he should continue acamprosate 333 mg 2 tabs TID and naltrexone 50 mg daily. Dr. Cyndie Chime has requested some labs for screening and monitoring of therapy, which I will order today.

## 2023-09-17 NOTE — Assessment & Plan Note (Signed)
Continue to work with psychiatry. Continue mirtazapine 7.5 mg daily and gabapentin 100 mg daily.

## 2023-09-17 NOTE — Progress Notes (Signed)
[facility] [address] [place] [address] Dept: [phone] Dept Fax: [phone]  Chronic Care Office Visit  Subjective:    Patient ID: Gregory Bishop, male    DOB: Jan 22, 1976, 47 y.o..   MRN: [id number]  Chief Complaint  Patient presents with   Follow-up    6 month f/u.  No concerns.  Needs labs per other provider: CBC, CMP, TSH, FT4, Lipid, A1C, B-12, Folate.   History of Present Illness:  Patient is in today for reassessment of chronic medical issues.  Mr. Munns has an alcohol addiction. He has maintained sobriety since April. He is working with a counselor Ralene Ok, LCSW) and Dr. Cyndie Chime (psychiatry). He remains on naltrexone 50 mg daily and acamprosate 333 mg 2 tabs TID. He remains engaged in daily virtual AA meetings. He finds this has been helpful, though has been a barrier to finding a job.   Mr. Sonnenberg also has bipolar disorder and PTSD. He is now managed on mirtazapine 7.5 mg daily and gabapentin 100 mg daily. He feels his mood is doing well.   Past Medical History: Patient Active Problem List   Diagnosis Date Noted   Erectile dysfunction 03/26/2023   Memory impairment 02/05/2023   Nightmares 01/30/2022   Wheezing 12/23/2021   Tobacco use disorder, mild, abuse 12/23/2021   Insomnia 12/05/2021   Alcohol use disorder, severe, in sustained remission (HCC) 07/24/2021   Bipolar 1 disorder (HCC) 07/21/2021   PTSD (post-traumatic stress disorder) 07/21/2021   Dizziness 05/22/2021   Past Surgical History:  Procedure Laterality Date   LEG SURGERY Left 2014   Family History  Problem Relation Age of Onset   Diabetes Maternal Uncle    Diabetes Maternal Grandfather    Outpatient Medications Prior to Visit  Medication Sig Dispense Refill   acamprosate (CAMPRAL) 333 MG tablet Take 2 tablets (666 mg total) by mouth 3 (three) times daily with meals. 360 tablet 0   albuterol (VENTOLIN HFA) 108 (90 Base)  MCG/ACT inhaler Inhale 1-2 puffs into the lungs every 4 (four) hours as needed for wheezing or shortness of breath. 18 g 0   cetirizine (ZYRTEC ALLERGY) 10 MG tablet Take 1 tablet (10 mg total) by mouth daily. 28 tablet 0   cyanocobalamin (VITAMIN B12) 1000 MCG tablet Take 1 tablet (1,000 mcg total) by mouth daily. 60 tablet 0   gabapentin (NEURONTIN) 100 MG capsule Take 1 capsule (100 mg total) by mouth daily as needed (anxiety, alcohol craving). 30 capsule 0   mirtazapine (REMERON) 15 MG tablet Take 1 tablet (15 mg total) by mouth at bedtime. 60 tablet 0   naltrexone (DEPADE) 50 MG tablet Take 1 tablet (50 mg total) by mouth at bedtime. 60 tablet 0   NON FORMULARY Take 1-2 tablets by mouth See admin instructions. Emergen-C Citrus-Ginger Gummies, Turmeric and Ginger, Immune Support gummies- Chew 1-2 gummies by mouth daily     sildenafil (VIAGRA) 50 MG tablet Take 1 tablet (50 mg total) by mouth daily as needed for erectile dysfunction. 10 tablet 11   thiamine (VITAMIN B1) 100 MG tablet Take 1 tablet (100 mg total) by mouth daily. 60 tablet 0   No facility-administered medications prior to visit.   No Known Allergies Objective:   Today's Vitals   09/17/23 1413  BP: 112/68  Pulse: 84  Temp: (!) 97.5 F (36.4 C)  TempSrc: Temporal  SpO2: 94%  Weight: 143 lb 12.8 oz (65.2 kg)  Height: 5' 10.5" (1.791 m)  Body mass index is 20.34 kg/m.   General: Well developed, well nourished. No acute distress. Psych: Alert and oriented. Normal mood and affect.  Health Maintenance Due  Topic Date Due   Colonoscopy  Never done     Assessment & Plan:   Problem List Items Addressed This Visit       Other   Alcohol use disorder, severe, in sustained remission (HCC) - Primary    Maintaining sobriety for the past 8 months. I strongly encourage ongoing counseling, attendance of AA, and he should continue acamprosate 333 mg 2 tabs TID and naltrexone 50 mg daily. Dr. Cyndie Chime has requested some labs  for screening and monitoring of therapy, which I will order today.      Relevant Orders   CBC   Comprehensive metabolic panel   TSH   T4, free   Hemoglobin A1c   Lipid panel   Vitamin B12   Folate   VITAMIN D 25 Hydroxy (Vit-D Deficiency, Fractures)   Bipolar 1 disorder (HCC)    Continue to work with psychiatry. Continue mirtazapine 7.5 mg daily and gabapentin 100 mg daily.      Relevant Orders   Comprehensive metabolic panel   Hemoglobin A1c   Lipid panel   Tobacco use disorder, mild, abuse    Continue to encourage smoking cessation. Patient is not at a point where he is ready to attempt this.       Return in about 6 months (around 03/17/2024) for Reassessment.   Loyola Mast, MD

## 2023-09-17 NOTE — Assessment & Plan Note (Addendum)
Continue to encourage smoking cessation. Patient is not at a point where he is ready to attempt this.

## 2023-09-18 LAB — CBC

## 2023-09-19 LAB — LIPID PANEL
Chol/HDL Ratio: 3 {ratio} (ref 0.0–5.0)
Cholesterol, Total: 199 mg/dL (ref 100–199)
HDL: 67 mg/dL (ref >39)
LDL Chol Calc (NIH): 103 mg/dL — ABNORMAL HIGH (ref 0–99)
Triglycerides: 172 mg/dL — ABNORMAL HIGH (ref 0–149)
VLDL Cholesterol Cal: 29 mg/dL (ref 5–40)

## 2023-09-19 LAB — COMPREHENSIVE METABOLIC PANEL
ALT: 12 [IU]/L (ref 0–44)
AST: 18 [IU]/L (ref 0–40)
Albumin: 4.4 g/dL (ref 4.1–5.1)
Alkaline Phosphatase: 86 [IU]/L (ref 44–121)
BUN/Creatinine Ratio: 9 (ref 9–20)
BUN: 8 mg/dL (ref 6–24)
Bilirubin Total: 0.3 mg/dL (ref 0.0–1.2)
CO2: 25 mmol/L (ref 20–29)
Calcium: 10 mg/dL (ref 8.7–10.2)
Chloride: 102 mmol/L (ref 96–106)
Creatinine, Ser: 0.91 mg/dL (ref 0.76–1.27)
Globulin, Total: 2.6 g/dL (ref 1.5–4.5)
Glucose: 99 mg/dL (ref 70–99)
Potassium: 4.1 mmol/L (ref 3.5–5.2)
Sodium: 141 mmol/L (ref 134–144)
Total Protein: 7 g/dL (ref 6.0–8.5)
eGFR: 104 mL/min/{1.73_m2} (ref >59)

## 2023-09-19 LAB — CBC
Hematocrit: 45.4 % (ref 37.5–51.0)
Hemoglobin: 15.3 g/dL (ref 13.0–17.7)
MCH: 31.4 pg (ref 26.6–33.0)
MCHC: 33.7 g/dL (ref 31.5–35.7)
MCV: 93 fL (ref 79–97)
RBC: 4.88 x10E6/uL (ref 4.14–5.80)
RDW: 12.1 % (ref 11.6–15.4)
WBC: 10.1 10*3/uL (ref 3.4–10.8)

## 2023-09-19 LAB — T4, FREE: Free T4: 1.38 ng/dL (ref 0.82–1.77)

## 2023-09-19 LAB — FOLATE: Folate: 6.2 ng/mL (ref >3.0)

## 2023-09-19 LAB — HEMOGLOBIN A1C
Est. average glucose Bld gHb Est-mCnc: 114 mg/dL
Hgb A1c MFr Bld: 5.6 % (ref 4.8–5.6)

## 2023-09-19 LAB — VITAMIN D 25 HYDROXY (VIT D DEFICIENCY, FRACTURES): Vit D, 25-Hydroxy: 6.8 ng/mL — ABNORMAL LOW (ref 30.0–100.0)

## 2023-09-19 LAB — VITAMIN B12: Vitamin B-12: 1119 pg/mL (ref 232–1245)

## 2023-09-19 LAB — TSH: TSH: 1.96 u[IU]/mL (ref 0.450–4.500)

## 2023-09-20 ENCOUNTER — Other Ambulatory Visit: Payer: Self-pay

## 2023-09-20 DIAGNOSIS — E559 Vitamin D deficiency, unspecified: Secondary | ICD-10-CM | POA: Insufficient documentation

## 2023-09-20 MED ORDER — VITAMIN D (ERGOCALCIFEROL) 1.25 MG (50000 UNIT) PO CAPS
50000.0000 [IU] | ORAL_CAPSULE | ORAL | 0 refills | Status: DC
  Filled 2023-09-20: qty 12, 84d supply, fill #0

## 2023-09-20 NOTE — Addendum Note (Signed)
Addended by: Loyola Mast on: 09/20/2023 08:33 AM   Modules accepted: Orders

## 2023-09-21 ENCOUNTER — Other Ambulatory Visit: Payer: Self-pay | Admitting: Family Medicine

## 2023-09-21 ENCOUNTER — Other Ambulatory Visit: Payer: Self-pay

## 2023-09-21 DIAGNOSIS — R062 Wheezing: Secondary | ICD-10-CM

## 2023-09-21 MED ORDER — ALBUTEROL SULFATE HFA 108 (90 BASE) MCG/ACT IN AERS
1.0000 | INHALATION_SPRAY | RESPIRATORY_TRACT | 3 refills | Status: DC | PRN
Start: 2023-09-21 — End: 2023-10-18
  Filled 2023-09-21: qty 18, 17d supply, fill #0

## 2023-09-21 NOTE — Telephone Encounter (Signed)
Please review and advise. Thanks. Dm/cma  

## 2023-09-21 NOTE — Telephone Encounter (Signed)
Refill request  Rx #: [id number]  albuterol (VENTOLIN HFA) 108 (90 Base) MCG/ACT inhaler [[id number]]   [facility] [address], [place] [address] Phone: [phone]  Fax: [phone]

## 2023-09-22 ENCOUNTER — Other Ambulatory Visit: Payer: Self-pay

## 2023-09-22 NOTE — Telephone Encounter (Signed)
Called number and advised that medication was sent to the pharmacy. Dm/cma

## 2023-09-27 ENCOUNTER — Ambulatory Visit: Payer: MEDICAID | Admitting: Family Medicine

## 2023-09-27 ENCOUNTER — Ambulatory Visit (HOSPITAL_COMMUNITY): Payer: MEDICAID | Admitting: Licensed Clinical Social Worker

## 2023-09-28 ENCOUNTER — Ambulatory Visit (HOSPITAL_COMMUNITY): Payer: MEDICAID | Admitting: Licensed Clinical Social Worker

## 2023-10-08 ENCOUNTER — Ambulatory Visit (HOSPITAL_COMMUNITY): Payer: MEDICAID | Admitting: Licensed Clinical Social Worker

## 2023-10-13 ENCOUNTER — Other Ambulatory Visit: Payer: Self-pay

## 2023-10-13 ENCOUNTER — Inpatient Hospital Stay (HOSPITAL_COMMUNITY)
Admission: EM | Admit: 2023-10-13 | Discharge: 2023-10-18 | DRG: 202 | Disposition: A | Discharge status: Home / Self Care | Payer: MEDICAID | Attending: Family Medicine | Admitting: Family Medicine

## 2023-10-13 ENCOUNTER — Emergency Department (HOSPITAL_COMMUNITY)
Admission: EM | Admit: 2023-10-13 | Discharge: 2023-10-13 | Disposition: A | Discharge status: Home / Self Care | Payer: MEDICAID | Source: Home / Self Care | Attending: Emergency Medicine | Admitting: Emergency Medicine

## 2023-10-13 ENCOUNTER — Emergency Department (HOSPITAL_COMMUNITY): Payer: MEDICAID

## 2023-10-13 ENCOUNTER — Encounter (HOSPITAL_COMMUNITY): Payer: Self-pay | Admitting: Emergency Medicine

## 2023-10-13 DIAGNOSIS — F4310 Post-traumatic stress disorder, unspecified: Secondary | ICD-10-CM | POA: Diagnosis present

## 2023-10-13 DIAGNOSIS — Z1152 Encounter for screening for COVID-19: Secondary | ICD-10-CM

## 2023-10-13 DIAGNOSIS — F17210 Nicotine dependence, cigarettes, uncomplicated: Secondary | ICD-10-CM | POA: Diagnosis present

## 2023-10-13 DIAGNOSIS — Z20822 Contact with and (suspected) exposure to covid-19: Secondary | ICD-10-CM | POA: Insufficient documentation

## 2023-10-13 DIAGNOSIS — F319 Bipolar disorder, unspecified: Secondary | ICD-10-CM | POA: Diagnosis present

## 2023-10-13 DIAGNOSIS — J45901 Unspecified asthma with (acute) exacerbation: Secondary | ICD-10-CM

## 2023-10-13 DIAGNOSIS — Z833 Family history of diabetes mellitus: Secondary | ICD-10-CM

## 2023-10-13 DIAGNOSIS — F1011 Alcohol abuse, in remission: Secondary | ICD-10-CM | POA: Diagnosis present

## 2023-10-13 DIAGNOSIS — R0602 Shortness of breath: Secondary | ICD-10-CM | POA: Diagnosis not present

## 2023-10-13 DIAGNOSIS — J189 Pneumonia, unspecified organism: Secondary | ICD-10-CM

## 2023-10-13 DIAGNOSIS — Z8673 Personal history of transient ischemic attack (TIA), and cerebral infarction without residual deficits: Secondary | ICD-10-CM

## 2023-10-13 DIAGNOSIS — R062 Wheezing: Secondary | ICD-10-CM

## 2023-10-13 DIAGNOSIS — E538 Deficiency of other specified B group vitamins: Secondary | ICD-10-CM | POA: Diagnosis present

## 2023-10-13 DIAGNOSIS — J181 Lobar pneumonia, unspecified organism: Secondary | ICD-10-CM | POA: Insufficient documentation

## 2023-10-13 DIAGNOSIS — R0902 Hypoxemia: Secondary | ICD-10-CM | POA: Diagnosis present

## 2023-10-13 DIAGNOSIS — J129 Viral pneumonia, unspecified: Secondary | ICD-10-CM | POA: Diagnosis present

## 2023-10-13 LAB — RESP PANEL BY RT-PCR (RSV, FLU A&B, COVID)  RVPGX2
Influenza A by PCR: NEGATIVE
Influenza B by PCR: NEGATIVE
Resp Syncytial Virus by PCR: NEGATIVE
SARS Coronavirus 2 by RT PCR: NEGATIVE

## 2023-10-13 MED ORDER — AMOXICILLIN 500 MG PO CAPS
1000.0000 mg | ORAL_CAPSULE | Freq: Once | ORAL | Status: AC
  Administered 2023-10-13: 1000 mg via ORAL
  Filled 2023-10-13: qty 2

## 2023-10-13 MED ORDER — ALBUTEROL SULFATE HFA 108 (90 BASE) MCG/ACT IN AERS: 2.0000 | INHALATION_SPRAY | RESPIRATORY_TRACT | Status: DC | PRN

## 2023-10-13 MED ORDER — AZITHROMYCIN 250 MG PO TABS
500.0000 mg | ORAL_TABLET | Freq: Once | ORAL | Status: AC
  Administered 2023-10-13: 500 mg via ORAL
  Filled 2023-10-13: qty 2

## 2023-10-13 MED ORDER — AMOXICILLIN 500 MG PO CAPS
1000.0000 mg | ORAL_CAPSULE | Freq: Three times a day (TID) | ORAL | 0 refills | Status: DC
  Filled 2023-10-13: qty 30, 5d supply, fill #0

## 2023-10-13 MED ORDER — AZITHROMYCIN 250 MG PO TABS
250.0000 mg | ORAL_TABLET | Freq: Every day | ORAL | 0 refills | Status: DC
  Filled 2023-10-13: qty 4, 4d supply, fill #0

## 2023-10-13 MED ORDER — IPRATROPIUM BROMIDE 0.02 % IN SOLN
0.5000 mg | Freq: Once | RESPIRATORY_TRACT | Status: AC
  Administered 2023-10-13: 0.5 mg via RESPIRATORY_TRACT
  Filled 2023-10-13: qty 2.5

## 2023-10-13 MED ORDER — PREDNISONE 20 MG PO TABS
40.0000 mg | ORAL_TABLET | Freq: Once | ORAL | Status: AC
  Administered 2023-10-13: 40 mg via ORAL
  Filled 2023-10-13: qty 2

## 2023-10-13 MED ORDER — ALBUTEROL SULFATE (2.5 MG/3ML) 0.083% IN NEBU
5.0000 mg | INHALATION_SOLUTION | Freq: Once | RESPIRATORY_TRACT | Status: AC
  Administered 2023-10-13: 5 mg via RESPIRATORY_TRACT
  Filled 2023-10-13: qty 6

## 2023-10-13 MED ORDER — PREDNISONE 20 MG PO TABS
40.0000 mg | ORAL_TABLET | Freq: Every day | ORAL | 0 refills | Status: DC
  Filled 2023-10-13: qty 8, 4d supply, fill #0

## 2023-10-13 NOTE — ED Triage Notes (Signed)
 Pt arrives w/ c/o SOB since yesterday. Hx asthma reports using inhalers w/ no relief.

## 2023-10-13 NOTE — ED Triage Notes (Signed)
 Patient with hx asthma here after being discharged from [facility] this morning for eval of continued sob. Has not picked up abx or steroids prescribed to him d/t the pharmacy being closed.

## 2023-10-13 NOTE — ED Provider Notes (Signed)
 [facility] EMERGENCY DEPARTMENT AT [facility] Provider Note   CSN: [id number] Arrival date & time: 10/13/23  0539     History  Chief Complaint  Patient presents with   Shortness of Breath    Gregory Bishop is a 48 y.o. male.  Patient with history of asthma presents the emergency department today for evaluation of shortness of breath and trouble breathing.  He states that he woke up with the symptoms this morning.  No fevers.  He has had a cough.  He tried home Ventolin  without improvement.  Does report being around a child with recent viral illness.  He took leftover prednisone  that he had at home, 20 mg total.  No chest pain.         Home Medications Prior to Admission medications   Medication Sig Start Date End Date Taking? Authorizing Provider  acamprosate  (CAMPRAL ) 333 MG tablet Take 2 tablets (666 mg total) by mouth 3 (three) times daily with meals. 09/03/23 11/21/23  Nguyen, Julie, DO  albuterol  (VENTOLIN  HFA) 108 (90 Base) MCG/ACT inhaler Inhale 1-2 puffs into the lungs every 4 (four) hours as needed for wheezing or shortness of breath. 09/21/23   Thedora Garnette HERO, MD  cetirizine  (ZYRTEC  ALLERGY) 10 MG tablet Take 1 tablet (10 mg total) by mouth daily. 01/19/22   Neldon Hamp RAMAN, PA  cyanocobalamin  (VITAMIN B12) 1000 MCG tablet Take 1 tablet (1,000 mcg total) by mouth daily. 09/03/23 11/21/23  Nguyen, Julie, DO  gabapentin  (NEURONTIN ) 100 MG capsule Take 1 capsule (100 mg total) by mouth daily as needed (anxiety, alcohol craving). 09/03/23 10/03/23  Nguyen, Julie, DO  mirtazapine  (REMERON ) 15 MG tablet Take 1 tablet (15 mg total) by mouth at bedtime. 09/03/23   Nguyen, Julie, DO  naltrexone  (DEPADE) 50 MG tablet Take 1 tablet (50 mg total) by mouth at bedtime. 09/03/23 11/21/23  Nguyen, Julie, DO  NON FORMULARY Take 1-2 tablets by mouth See admin instructions. Emergen-C Citrus-Ginger Gummies, Turmeric and Ginger, Immune Support gummies- Chew 1-2 gummies by mouth daily     [provider]  sildenafil  (VIAGRA ) 50 MG tablet Take 1 tablet (50 mg total) by mouth daily as needed for erectile dysfunction. 03/26/23   Thedora Garnette HERO, MD  thiamine  (VITAMIN B1) 100 MG tablet Take 1 tablet (100 mg total) by mouth daily. 09/03/23 11/02/23  Nguyen, Julie, DO  Vitamin D , Ergocalciferol , (DRISDOL ) 1.25 MG (50000 UNIT) CAPS capsule Take 1 capsule (50,000 Units total) by mouth every 7 (seven) days. 09/20/23   Thedora Garnette HERO, MD      Allergies    Patient has no known allergies.    Review of Systems   Review of Systems  Physical Exam Updated Vital Signs BP (!) 126/97   Pulse (!) 116   Temp 97.6 F (36.4 C) (Oral)   Resp (!) 25   SpO2 94%  Physical Exam Vitals and nursing note reviewed.  Constitutional:      General: He is not in acute distress.    Appearance: He is well-developed.  HENT:     Head: Normocephalic and atraumatic.     Mouth/Throat:     Mouth: Mucous membranes are moist.  Eyes:     General:        Right eye: No discharge.        Left eye: No discharge.     Conjunctiva/sclera: Conjunctivae normal.  Cardiovascular:     Rate and Rhythm: Regular rhythm. Tachycardia present.     Heart sounds:  Normal heart sounds.     Comments: Tachycardia Pulmonary:     Effort: Pulmonary effort is normal.     Breath sounds: Wheezing present.     Comments: Patient with expiratory wheezing throughout all fields, increased work of breathing noted. Abdominal:     Palpations: Abdomen is soft.     Tenderness: There is no abdominal tenderness.  Musculoskeletal:     Cervical back: Normal range of motion and neck supple.  Skin:    General: Skin is warm and dry.  Neurological:     Mental Status: He is alert.     ED Results / Procedures / Treatments   Labs (all labs ordered are listed, but only abnormal results are displayed) Labs Reviewed  RESP PANEL BY RT-PCR (RSV, FLU A&B, COVID)  [id number]    EKG None  Radiology DG Chest 2 View Result Date:  10/13/2023 CLINICAL DATA:  Shortness of breath . EXAM: CHEST - 2 VIEW COMPARISON:  01/19/2022 FINDINGS: Lungs are hyperexpanded. Right lung clear. Subtle focal airspace disease noted parahilar left lung. The cardiopericardial silhouette is within normal limits for size. No acute bony abnormality. Telemetry leads overlie the chest. IMPRESSION: Subtle focal airspace disease parahilar left lung. Imaging features compatible with pneumonia. Follow-up recommended to ensure resolution. Electronically Signed   By: Camellia Candle M.D.   On: 10/13/2023 07:25    Procedures Procedures    Medications Ordered in ED Medications  albuterol  (VENTOLIN  HFA) 108 (90 Base) MCG/ACT inhaler 2 puff (has no administration in time range)  albuterol  (PROVENTIL ) (2.5 MG/3ML) 0.083% nebulizer solution 5 mg (has no administration in time range)  ipratropium (ATROVENT ) nebulizer solution 0.5 mg (has no administration in time range)  predniSONE  (DELTASONE ) tablet 40 mg (has no administration in time range)    ED Course/ Medical Decision Making/ A&P    Patient seen and examined. History obtained directly from patient. Work-up including labs, imaging, EKG ordered in triage, if performed, were reviewed.    Labs/EKG: Ordered flu, COVID, RSV panel  Imaging: Independently visualized and interpreted.  This included: Chest x-ray, agree subtle perihilar infiltrate on the left.  Medications/Fluids: Ordered: Albuterol /Atrovent , p.o. prednisone .  Patient will need treatment for community-acquired pneumonia as well.  Most recent vital signs reviewed and are as follows: BP (!) 126/97   Pulse (!) 116   Temp 97.6 F (36.4 C) (Oral)   Resp (!) 25   SpO2 94%   Initial impression: Shortness of breath, asthma exacerbation, area on chest x-ray concerning for pneumonia.  10:02 AM Rechecked, on neb, states improvement, looks more comfortable.   11:01 AM Reassessment performed. Patient appears improved, off nebulizer, appears  comfortable.  I asked him if he wanted another breathing treatment and he declines.  He is comfortable with discharge at this time.  Labs personally reviewed and interpreted including: Respiratory panel negative.  Reviewed pertinent lab work and imaging with patient at bedside. Questions answered.   Most current vital signs reviewed and are as follows: BP 127/83   Pulse (!) 105   Temp 98.4 F (36.9 C)   Resp (!) 22   SpO2 98%   Plan: Discharge to home.   Prescriptions written for: Azithromycin , amoxicillin , prednisone   Other home care instructions discussed: Avoidance of triggers, rest, hydration, albuterol  every 4 hours and then as needed after 24 hours  ED return instructions discussed: Worsening shortness of breath or trouble breathing, increased work of breathing, fever  Follow-up instructions discussed: Patient encouraged to follow-up with their PCP in 3  days.                                 Medical Decision Making Amount and/or Complexity of Data Reviewed Radiology: ordered.  Risk Prescription drug management.   Patient with cough and wheezing starting this morning.  He is an asthmatic.  No fevers.  Chest x-ray suggests left-sided community-acquired pneumonia.  Patient with good improvement in the ED with albuterol  and Atrovent  nebulizer.  Prednisone  has been started.  Given good improvement, comfortable discharged home.  He will be treated for community-acquired pneumonia.  The patient's vital signs, pertinent lab work and imaging were reviewed and interpreted as discussed in the ED course. Hospitalization was considered for further testing, treatments, or serial exams/observation. However as patient is well-appearing, has a stable exam, and reassuring studies today, I do not feel that they warrant admission at this time. This plan was discussed with the patient who verbalizes agreement and comfort with this plan and seems reliable and able to return to the Emergency  Department with worsening or changing symptoms.          Final Clinical Impression(s) / ED Diagnoses Final diagnoses:  Exacerbation of asthma, unspecified asthma severity, unspecified whether persistent  Community acquired pneumonia of left lung, unspecified part of lung    Rx / DC Orders ED Discharge Orders          Ordered    amoxicillin  (AMOXIL ) 500 MG capsule  3 times daily        10/13/23 1056    azithromycin  (ZITHROMAX ) 250 MG tablet  Daily        10/13/23 1056    predniSONE  (DELTASONE ) 20 MG tablet  Daily        10/13/23 1056              Desiderio Chew, PA-C 10/13/23 1108    Levander Houston, MD 10/18/23 [phone]

## 2023-10-13 NOTE — Discharge Instructions (Signed)
 Please read and follow all provided instructions.  Your diagnoses today include:  1. Exacerbation of asthma, unspecified asthma severity, unspecified whether persistent   2. Community acquired pneumonia of left lung, unspecified part of lung     Tests performed today include: Flu, COVID, RSV testing was negative Chest x-ray -- shows possible pneumonia in left lung Vital signs. See below for your results today   Medications prescribed:  Amoxicillin  - antibiotic  You have been prescribed an antibiotic medicine: take the entire course of medicine even if you are feeling better. Stopping early can cause the antibiotic not to work.  Azithromycin  - antibiotic for respiratory infection  You have been prescribed an antibiotic medicine: take the entire course of medicine even if you are feeling better. Stopping early can cause the antibiotic not to work.  Prednisone  - steroid medicine   It is best to take this medication in the morning to prevent sleeping problems. If you are diabetic, monitor your blood sugar closely and stop taking Prednisone  if blood sugar is over 300. Take with food to prevent stomach upset.   Take any prescribed medications only as directed.  Home care instructions:  Follow any educational materials contained in this packet.  Use home albuterol  inhaler every 4 hours for the next 24 hours, and then as needed.  Take the complete course of antibiotics that you were prescribed.   BE VERY CAREFUL not to take multiple medicines containing Tylenol  (also called acetaminophen ). Doing so can lead to an overdose which can damage your liver and cause liver failure and possibly death.   Follow-up instructions: Please follow-up with your primary care provider in the next 3 days for further evaluation of your symptoms and to ensure resolution of your infection.   Return instructions:  Please return to the Emergency Department if you experience worsening symptoms.  Return  immediately with worsening breathing, worsening shortness of breath, or if you feel it is taking you more effort to breathe.  Please return if you have any other emergent concerns.  Additional Information:  Your vital signs today were: BP 127/83   Pulse (!) 105   Temp 98.4 F (36.9 C)   Resp (!) 22   SpO2 98%  If your blood pressure (BP) was elevated above 135/85 this visit, please have this repeated by your doctor within one month. --------------

## 2023-10-14 ENCOUNTER — Encounter (HOSPITAL_COMMUNITY): Payer: Self-pay

## 2023-10-14 ENCOUNTER — Other Ambulatory Visit: Payer: Self-pay

## 2023-10-14 DIAGNOSIS — J45901 Unspecified asthma with (acute) exacerbation: Secondary | ICD-10-CM

## 2023-10-14 DIAGNOSIS — J189 Pneumonia, unspecified organism: Principal | ICD-10-CM

## 2023-10-14 DIAGNOSIS — Z8673 Personal history of transient ischemic attack (TIA), and cerebral infarction without residual deficits: Secondary | ICD-10-CM | POA: Diagnosis not present

## 2023-10-14 DIAGNOSIS — R0902 Hypoxemia: Secondary | ICD-10-CM | POA: Diagnosis present

## 2023-10-14 DIAGNOSIS — E538 Deficiency of other specified B group vitamins: Secondary | ICD-10-CM | POA: Diagnosis present

## 2023-10-14 DIAGNOSIS — F17210 Nicotine dependence, cigarettes, uncomplicated: Secondary | ICD-10-CM | POA: Diagnosis present

## 2023-10-14 DIAGNOSIS — F319 Bipolar disorder, unspecified: Secondary | ICD-10-CM | POA: Diagnosis present

## 2023-10-14 DIAGNOSIS — R0602 Shortness of breath: Secondary | ICD-10-CM | POA: Diagnosis present

## 2023-10-14 DIAGNOSIS — F4310 Post-traumatic stress disorder, unspecified: Secondary | ICD-10-CM | POA: Diagnosis present

## 2023-10-14 DIAGNOSIS — F1011 Alcohol abuse, in remission: Secondary | ICD-10-CM | POA: Diagnosis present

## 2023-10-14 DIAGNOSIS — J129 Viral pneumonia, unspecified: Secondary | ICD-10-CM | POA: Diagnosis present

## 2023-10-14 DIAGNOSIS — Z1152 Encounter for screening for COVID-19: Secondary | ICD-10-CM | POA: Diagnosis not present

## 2023-10-14 DIAGNOSIS — Z833 Family history of diabetes mellitus: Secondary | ICD-10-CM | POA: Diagnosis not present

## 2023-10-14 LAB — BASIC METABOLIC PANEL
Anion gap: 10 (ref 5–15)
BUN: 7 mg/dL (ref 6–20)
CO2: 27 mmol/L (ref 22–32)
Calcium: 9.4 mg/dL (ref 8.9–10.3)
Chloride: 102 mmol/L (ref 98–111)
Creatinine, Ser: 0.89 mg/dL (ref 0.61–1.24)
GFR, Estimated: >60 mL/min (ref >60)
Glucose, Bld: 118 mg/dL — ABNORMAL HIGH (ref 70–99)
Potassium: 4.4 mmol/L (ref 3.5–5.1)
Sodium: 139 mmol/L (ref 135–145)

## 2023-10-14 LAB — CBC
HCT: 47.9 % (ref 39.0–52.0)
HCT: 46.6 % (ref 39.0–52.0)
Hemoglobin: 16.3 g/dL (ref 13.0–17.0)
Hemoglobin: 15.9 g/dL (ref 13.0–17.0)
MCH: 31.8 pg (ref 26.0–34.0)
MCH: 31.5 pg (ref 26.0–34.0)
MCHC: 34 g/dL (ref 30.0–36.0)
MCHC: 34.1 g/dL (ref 30.0–36.0)
MCV: 93.6 fL (ref 80.0–100.0)
MCV: 92.5 fL (ref 80.0–100.0)
Platelets: 233 10*3/uL (ref 150–400)
Platelets: 204 10*3/uL (ref 150–400)
RBC: 5.12 MIL/uL (ref 4.22–5.81)
RBC: 5.04 MIL/uL (ref 4.22–5.81)
RDW: 12.5 % (ref 11.5–15.5)
RDW: 12.6 % (ref 11.5–15.5)
WBC: 17.2 10*3/uL — ABNORMAL HIGH (ref 4.0–10.5)
WBC: 13.3 10*3/uL — ABNORMAL HIGH (ref 4.0–10.5)
nRBC: 0 % (ref 0.0–0.2)
nRBC: 0 % (ref 0.0–0.2)

## 2023-10-14 LAB — CREATININE, SERUM
Creatinine, Ser: 1.05 mg/dL (ref 0.61–1.24)
GFR, Estimated: >60 mL/min (ref >60)

## 2023-10-14 LAB — STREP PNEUMONIAE URINARY ANTIGEN: Strep Pneumo Urinary Antigen: NEGATIVE

## 2023-10-14 LAB — HIV ANTIBODY (ROUTINE TESTING W REFLEX): HIV Screen 4th Generation wRfx: NONREACTIVE

## 2023-10-14 MED ORDER — ACETAMINOPHEN 325 MG PO TABS: 650.0000 mg | ORAL_TABLET | Freq: Four times a day (QID) | ORAL | Status: DC | PRN

## 2023-10-14 MED ORDER — SODIUM CHLORIDE 0.9 % IV BOLUS
1000.0000 mL | Freq: Once | INTRAVENOUS | Status: AC
Start: 2023-10-14 — End: 2023-10-14
  Administered 2023-10-14: 1000 mL via INTRAVENOUS

## 2023-10-14 MED ORDER — SODIUM CHLORIDE 0.9 % IV SOLN
2.0000 g | Freq: Every day | INTRAVENOUS | Status: DC
  Administered 2023-10-14 – 2023-10-17 (×4): 2 g via INTRAVENOUS
  Filled 2023-10-14 (×4): qty 20

## 2023-10-14 MED ORDER — SODIUM CHLORIDE 0.9 % IV SOLN
500.0000 mg | Freq: Every day | INTRAVENOUS | Status: DC
  Administered 2023-10-15: 500 mg via INTRAVENOUS
  Filled 2023-10-14: qty 5

## 2023-10-14 MED ORDER — METHYLPREDNISOLONE SODIUM SUCC 125 MG IJ SOLR: 60.0000 mg | Freq: Two times a day (BID) | INTRAMUSCULAR | Status: DC

## 2023-10-14 MED ORDER — ALUM & MAG HYDROXIDE-SIMETH 200-200-20 MG/5ML PO SUSP: 30.0000 mL | Freq: Four times a day (QID) | ORAL | Status: DC | PRN

## 2023-10-14 MED ORDER — NALTREXONE HCL 50 MG PO TABS
50.0000 mg | ORAL_TABLET | Freq: Every day | ORAL | Status: DC
  Administered 2023-10-15 – 2023-10-17 (×4): 50 mg via ORAL
  Filled 2023-10-14 (×5): qty 1

## 2023-10-14 MED ORDER — THIAMINE HCL 100 MG PO TABS
100.0000 mg | ORAL_TABLET | Freq: Every day | ORAL | Status: DC
  Administered 2023-10-14 – 2023-10-18 (×6): 100 mg via ORAL
  Filled 2023-10-14 (×10): qty 1

## 2023-10-14 MED ORDER — SODIUM CHLORIDE 0.9 % IV SOLN
1.0000 g | Freq: Once | INTRAVENOUS | Status: AC
  Administered 2023-10-14: 1 g via INTRAVENOUS
  Filled 2023-10-14: qty 10

## 2023-10-14 MED ORDER — BUDESONIDE 0.25 MG/2ML IN SUSP
0.2500 mg | Freq: Two times a day (BID) | RESPIRATORY_TRACT | Status: DC
  Administered 2023-10-14 – 2023-10-18 (×8): 0.25 mg via RESPIRATORY_TRACT
  Filled 2023-10-14 (×7): qty 2

## 2023-10-14 MED ORDER — ALBUTEROL SULFATE (2.5 MG/3ML) 0.083% IN NEBU
2.5000 mg | INHALATION_SOLUTION | Freq: Four times a day (QID) | RESPIRATORY_TRACT | Status: DC
  Administered 2023-10-14 – 2023-10-16 (×7): 2.5 mg via RESPIRATORY_TRACT
  Filled 2023-10-14 (×7): qty 3

## 2023-10-14 MED ORDER — ONDANSETRON HCL 4 MG PO TABS: 4.0000 mg | ORAL_TABLET | Freq: Four times a day (QID) | ORAL | Status: DC | PRN

## 2023-10-14 MED ORDER — SODIUM CHLORIDE 0.9 % IV SOLN: 250.0000 mL | INTRAVENOUS | Status: DC | PRN

## 2023-10-14 MED ORDER — ENOXAPARIN SODIUM 40 MG/0.4ML IJ SOSY
40.0000 mg | PREFILLED_SYRINGE | Freq: Every day | INTRAMUSCULAR | Status: DC
  Administered 2023-10-14 – 2023-10-17 (×4): 40 mg via SUBCUTANEOUS
  Filled 2023-10-14 (×4): qty 0.4

## 2023-10-14 MED ORDER — IPRATROPIUM BROMIDE 0.02 % IN SOLN
0.5000 mg | Freq: Four times a day (QID) | RESPIRATORY_TRACT | Status: DC
  Administered 2023-10-14 – 2023-10-16 (×7): 0.5 mg via RESPIRATORY_TRACT
  Filled 2023-10-14 (×7): qty 2.5

## 2023-10-14 MED ORDER — PANTOPRAZOLE SODIUM 40 MG PO TBEC
40.0000 mg | DELAYED_RELEASE_TABLET | Freq: Every day | ORAL | Status: DC
  Administered 2023-10-14 – 2023-10-17 (×4): 40 mg via ORAL
  Filled 2023-10-14 (×3): qty 1

## 2023-10-14 MED ORDER — ALBUTEROL SULFATE (2.5 MG/3ML) 0.083% IN NEBU
2.5000 mg/h | INHALATION_SOLUTION | Freq: Once | RESPIRATORY_TRACT | Status: AC
  Administered 2023-10-14: 2.5 mg/h via RESPIRATORY_TRACT
  Filled 2023-10-14: qty 3

## 2023-10-14 MED ORDER — SODIUM CHLORIDE 0.9% FLUSH
3.0000 mL | Freq: Two times a day (BID) | INTRAVENOUS | Status: DC
  Administered 2023-10-14 – 2023-10-15 (×3): 3 mL via INTRAVENOUS

## 2023-10-14 MED ORDER — VITAMIN D (ERGOCALCIFEROL) 1.25 MG (50000 UNIT) PO CAPS
50000.0000 [IU] | ORAL_CAPSULE | ORAL | Status: DC
  Administered 2023-10-14: 50000 [IU] via ORAL
  Filled 2023-10-14: qty 1

## 2023-10-14 MED ORDER — METHYLPREDNISOLONE SODIUM SUCC 125 MG IJ SOLR
120.0000 mg | Freq: Every day | INTRAMUSCULAR | Status: AC
  Administered 2023-10-15 – 2023-10-17 (×3): 120 mg via INTRAVENOUS
  Filled 2023-10-14 (×3): qty 2

## 2023-10-14 MED ORDER — SODIUM CHLORIDE 0.9 % IV SOLN
500.0000 mg | Freq: Once | INTRAVENOUS | Status: AC
  Administered 2023-10-14: 500 mg via INTRAVENOUS
  Filled 2023-10-14: qty 5

## 2023-10-14 MED ORDER — VITAMIN B-12 1000 MCG PO TABS
1000.0000 ug | ORAL_TABLET | Freq: Every day | ORAL | Status: DC
  Administered 2023-10-14 – 2023-10-18 (×5): 1000 ug via ORAL
  Filled 2023-10-14 (×5): qty 1

## 2023-10-14 MED ORDER — GUAIFENESIN ER 600 MG PO TB12
600.0000 mg | ORAL_TABLET | Freq: Two times a day (BID) | ORAL | Status: DC
  Administered 2023-10-14 – 2023-10-18 (×9): 600 mg via ORAL
  Filled 2023-10-14 (×9): qty 1

## 2023-10-14 MED ORDER — ONDANSETRON HCL 4 MG/2ML IJ SOLN: 4.0000 mg | Freq: Four times a day (QID) | INTRAMUSCULAR | Status: DC | PRN

## 2023-10-14 MED ORDER — METHYLPREDNISOLONE SODIUM SUCC 125 MG IJ SOLR
125.0000 mg | Freq: Once | INTRAMUSCULAR | Status: AC
  Administered 2023-10-14: 125 mg via INTRAVENOUS
  Filled 2023-10-14: qty 2

## 2023-10-14 MED ORDER — POLYETHYLENE GLYCOL 3350 17 G PO PACK
17.0000 g | PACK | Freq: Every day | ORAL | Status: DC
  Administered 2023-10-14 – 2023-10-16 (×2): 17 g via ORAL
  Filled 2023-10-14 (×4): qty 1

## 2023-10-14 MED ORDER — ACETAMINOPHEN 650 MG RE SUPP: 650.0000 mg | Freq: Four times a day (QID) | RECTAL | Status: DC | PRN

## 2023-10-14 MED ORDER — IPRATROPIUM-ALBUTEROL 0.5-2.5 (3) MG/3ML IN SOLN
3.0000 mL | Freq: Once | RESPIRATORY_TRACT | Status: AC
  Administered 2023-10-14: 3 mL via RESPIRATORY_TRACT
  Filled 2023-10-14: qty 3

## 2023-10-14 MED ORDER — SODIUM CHLORIDE 0.9% FLUSH: 3.0000 mL | INTRAVENOUS | Status: DC | PRN

## 2023-10-14 MED ORDER — ALBUTEROL SULFATE (2.5 MG/3ML) 0.083% IN NEBU: 2.5000 mg | INHALATION_SOLUTION | RESPIRATORY_TRACT | Status: DC | PRN

## 2023-10-14 NOTE — H&P (Signed)
 History and Physical    Patient: Gregory Bishop FMW:[id number] DOB: 10/22/1975 DOA: 10/13/2023 DOS: the patient was seen and examined on 10/14/2023 PCP: Thedora Garnette HERO, MD  Patient coming from: Home  Chief Complaint:  Chief Complaint  Patient presents with   Shortness of Breath   HPI: Gregory Bishop is a 48 y.o. male with medical history significant of bronchial asthma, EtOH use-abstinent for the past 8 months-presented to the hospital for cough/shortness of breath x 3 days.  Per patient he was in his usual state of health-3 days back-started having shortness of breath that felt typical for his usual asthma flare.  He took his albuterol  inhaler without any relief.  He also started having productive cough with yellowish phlegm.  He presented to the ED yesterday-was discharged home on prednisone /Zithromax /amoxicillin -however his symptoms continue to worsen-as result he presented to the hospital today.  Patient denies any fever-claims that he never got better when he went home from the ED yesterday.  Denies any myalgias/arthralgias.  Per patient-his significant others family member has similar illness but no other sick exposures.  No headache No neck pain No chest pain No vomiting No diarrhea No hematuria No hematochezia   In the ED-he was found to have hypoxia requiring around 2-3 L of oxygen, chest x-ray showed infiltrate-he was found to be wheezing and an asthma flare-he was given antibiotics/steroids and the hospitalist service was asked to admit this patient for further evaluation and treatment.   Review of Systems: As mentioned in the history of present illness. All other systems reviewed and are negative. Past Medical History:  Diagnosis Date   Stroke [facility])    Past Surgical History:  Procedure Laterality Date   LEG SURGERY Left 2014   Social History:  reports that he has been smoking cigarettes. He has never used smokeless tobacco. He reports that he does not currently use  alcohol. He reports that he does not use drugs.  No Known Allergies  Family History  Problem Relation Age of Onset   Diabetes Maternal Uncle    Diabetes Maternal Grandfather     Prior to Admission medications   Medication Sig Start Date End Date Taking? Authorizing Provider  acamprosate  (CAMPRAL ) 333 MG tablet Take 2 tablets (666 mg total) by mouth 3 (three) times daily with meals. 09/03/23 11/21/23 Yes Nguyen, Julie, DO  albuterol  (VENTOLIN  HFA) 108 (90 Base) MCG/ACT inhaler Inhale 1-2 puffs into the lungs every 4 (four) hours as needed for wheezing or shortness of breath. 09/21/23  Yes Thedora Garnette HERO, MD  cetirizine  (ZYRTEC  ALLERGY) 10 MG tablet Take 1 tablet (10 mg total) by mouth daily. 01/19/22  Yes Fondaw, Hamp S, PA  cyanocobalamin  (VITAMIN B12) 1000 MCG tablet Take 1 tablet (1,000 mcg total) by mouth daily. 09/03/23 11/21/23 Yes Nguyen, Julie, DO  gabapentin  (NEURONTIN ) 100 MG capsule Take 1 capsule (100 mg total) by mouth daily as needed (anxiety, alcohol craving). Patient taking differently: Take 100 mg by mouth daily. 09/03/23 10/14/23 Yes Nguyen, Julie, DO  mirtazapine  (REMERON ) 15 MG tablet Take 1 tablet (15 mg total) by mouth at bedtime. 09/03/23  Yes Nguyen, Julie, DO  naltrexone  (DEPADE) 50 MG tablet Take 1 tablet (50 mg total) by mouth at bedtime. 09/03/23 11/21/23 Yes Nguyen, Julie, DO  NON FORMULARY Take 1-2 tablets by mouth See admin instructions. Emergen-C Citrus-Ginger Gummies, Turmeric and Ginger, Immune Support gummies- Chew 1-2 gummies by mouth daily   Yes [provider]  OLANZapine  (ZYPREXA ) 10 MG tablet Take 10 mg  by mouth at bedtime.   Yes [provider]  OVER THE COUNTER MEDICATION Take 1 capsule by mouth daily as needed (Allergies). Clear Lungs   Yes [provider]  sildenafil  (VIAGRA ) 50 MG tablet Take 1 tablet (50 mg total) by mouth daily as needed for erectile dysfunction. 03/26/23  Yes Thedora Garnette HERO, MD  thiamine  (VITAMIN B1) 100 MG  tablet Take 1 tablet (100 mg total) by mouth daily. 09/03/23 11/02/23 Yes Nguyen, Julie, DO  Vitamin D , Ergocalciferol , (DRISDOL ) 1.25 MG (50000 UNIT) CAPS capsule Take 1 capsule (50,000 Units total) by mouth every 7 (seven) days. 09/20/23  Yes Thedora Garnette HERO, MD  amoxicillin  (AMOXIL ) 500 MG capsule Take 2 capsules (1,000 mg total) by mouth 3 (three) times daily for 5 days. Patient not taking: Reported on 10/14/2023 10/13/23 10/19/23  Desiderio Chew, PA-C  azithromycin  (ZITHROMAX ) 250 MG tablet Take 1 tablet (250 mg total) by mouth daily. Patient not taking: Reported on 10/14/2023 10/13/23   Desiderio Chew, PA-C  predniSONE  (DELTASONE ) 20 MG tablet Take 2 tablets (40 mg total) by mouth daily. Patient not taking: Reported on 10/14/2023 10/13/23   Desiderio Chew, PA-C    Physical Exam: Vitals:   10/14/23 0715 10/14/23 0821 10/14/23 0823 10/14/23 0830  BP:  (!) 130/92  (!) 123/91  Pulse:  98 (!) 107 (!) 109  Resp:  (!) 22  18  Temp:  98.4 F (36.9 C)    TempSrc:  Oral    SpO2: 93% 93% 97% 100%   Gen Exam:Alert awake-not in any distress HEENT:atraumatic, normocephalic Chest: Moving air-coarse rhonchi all over. CVS:S1S2 regular Abdomen:soft non tender, non distended Extremities:no edema Neurology: Non focal Skin: no rash  Data Reviewed:     Latest Ref Rng & Units 10/14/2023    9:43 AM 09/17/2023    3:05 PM 09/25/2022    3:01 PM  CBC  WBC 4.0 - 10.5 K/uL 17.2  10.1  9.1   Hemoglobin 13.0 - 17.0 g/dL 83.6  84.6  84.9   Hematocrit 39.0 - 52.0 % 47.9  45.4  44.2   Platelets 150 - 400 K/uL 233  CANCELED  142        Latest Ref Rng & Units 10/14/2023    9:43 AM 09/17/2023    3:05 PM 09/25/2022    3:01 PM  BMP  Glucose 70 - 99 mg/dL 881  99  77   BUN 6 - 20 mg/dL 7  8  10    Creatinine 0.61 - 1.24 mg/dL 9.10  9.08  9.01   BUN/Creat Ratio 9 - 20  9  SEE NOTE:   Sodium 135 - 145 mmol/L 139  141  139   Potassium 3.5 - 5.1 mmol/L 4.4  4.1  4.5   Chloride 98 - 111 mmol/L 102  102  103   CO2 22 - 32  mmol/L 27  25  29    Calcium  8.9 - 10.3 mg/dL 9.4  89.9  9.7      Assessment and Plan: Community-acquired pneumonia Afebrile but does have leukocytosis (although given steroids yesterday) Nontoxic-appearing-appears stable Continue Rocephin /Zithromax  Follow culture data  Bronchial asthma exacerbation Already feeling better after treatment in the ED Moving air with coarse rhonchi all over IV steroids/scheduled bronchodilators  History of vitamin B12 deficiency Continue supplementation  Prior history of EtOH use Has been clean x 8 months Continue naltrexone    Advance Care Planning:   Code Status: Full Code   Consults: None  Family Communication: None at bedside  Severity of Illness: The appropriate patient status for this patient is OBSERVATION. Observation status is judged to be reasonable and necessary in order to provide the required intensity of service to ensure the patient's safety. The patient's presenting symptoms, physical exam findings, and initial radiographic and laboratory data in the context of their medical condition is felt to place them at decreased risk for further clinical deterioration. Furthermore, it is anticipated that the patient will be medically stable for discharge from the hospital within 2 midnights of admission.   Author: Donalda Applebaum, MD 10/14/2023 2:14 PM  For on call review [email or web address].

## 2023-10-14 NOTE — Plan of Care (Signed)

## 2023-10-14 NOTE — ED Notes (Signed)
 CCMD notified. Pt is on monitor.

## 2023-10-14 NOTE — ED Notes (Signed)
 Patient left the floor in stable condition, AOX4, with his belongings and staff.

## 2023-10-14 NOTE — ED Provider Notes (Signed)
 [facility] Provider Note   CSN: [id number] Arrival date & time: 10/13/23  1835     History  Chief Complaint  Patient presents with   Shortness of Breath    Gregory Bishop is a 48 y.o. male with past medical history significant for bipolar disorder, PTSD, alcohol abuse, tobacco use, asthma who presents after being discharged from [facility] emergency department yesterday morning for continued shortness of breath.  Patient reports that he was unable to pick up antibiotics or steroids because the pharmacy was closed.  Patient reports that his breathing is worsening since being controlled at time of his evaluation at [facility].   Shortness of Breath      Home Medications Prior to Admission medications   Medication Sig Start Date End Date Taking? Authorizing Provider  acamprosate  (CAMPRAL ) 333 MG tablet Take 2 tablets (666 mg total) by mouth 3 (three) times daily with meals. 09/03/23 11/21/23 Yes Nguyen, Julie, DO  albuterol  (VENTOLIN  HFA) 108 (90 Base) MCG/ACT inhaler Inhale 1-2 puffs into the lungs every 4 (four) hours as needed for wheezing or shortness of breath. 09/21/23  Yes Thedora Garnette HERO, MD  cetirizine  (ZYRTEC  ALLERGY) 10 MG tablet Take 1 tablet (10 mg total) by mouth daily. 01/19/22  Yes Fondaw, Hamp S, PA  cyanocobalamin  (VITAMIN B12) 1000 MCG tablet Take 1 tablet (1,000 mcg total) by mouth daily. 09/03/23 11/21/23 Yes Nguyen, Julie, DO  gabapentin  (NEURONTIN ) 100 MG capsule Take 1 capsule (100 mg total) by mouth daily as needed (anxiety, alcohol craving). Patient taking differently: Take 100 mg by mouth daily. 09/03/23 10/14/23 Yes Nguyen, Julie, DO  mirtazapine  (REMERON ) 15 MG tablet Take 1 tablet (15 mg total) by mouth at bedtime. 09/03/23  Yes Nguyen, Julie, DO  naltrexone  (DEPADE) 50 MG tablet Take 1 tablet (50 mg total) by mouth at bedtime. 09/03/23 11/21/23 Yes Nguyen, Julie, DO  NON FORMULARY Take 1-2 tablets by mouth See admin  instructions. Emergen-C Citrus-Ginger Gummies, Turmeric and Ginger, Immune Support gummies- Chew 1-2 gummies by mouth daily   Yes [provider]  OLANZapine  (ZYPREXA ) 10 MG tablet Take 10 mg by mouth at bedtime.   Yes [provider]  OVER THE COUNTER MEDICATION Take 1 capsule by mouth daily as needed (Allergies). Clear Lungs   Yes [provider]  sildenafil  (VIAGRA ) 50 MG tablet Take 1 tablet (50 mg total) by mouth daily as needed for erectile dysfunction. 03/26/23  Yes Thedora Garnette HERO, MD  thiamine  (VITAMIN B1) 100 MG tablet Take 1 tablet (100 mg total) by mouth daily. 09/03/23 11/02/23 Yes Nguyen, Julie, DO  Vitamin D , Ergocalciferol , (DRISDOL ) 1.25 MG (50000 UNIT) CAPS capsule Take 1 capsule (50,000 Units total) by mouth every 7 (seven) days. 09/20/23  Yes Thedora Garnette HERO, MD  amoxicillin  (AMOXIL ) 500 MG capsule Take 2 capsules (1,000 mg total) by mouth 3 (three) times daily for 5 days. Patient not taking: Reported on 10/14/2023 10/13/23 10/19/23  Desiderio Chew, PA-C  azithromycin  (ZITHROMAX ) 250 MG tablet Take 1 tablet (250 mg total) by mouth daily. Patient not taking: Reported on 10/14/2023 10/13/23   Desiderio Chew, PA-C  predniSONE  (DELTASONE ) 20 MG tablet Take 2 tablets (40 mg total) by mouth daily. Patient not taking: Reported on 10/14/2023 10/13/23   Desiderio Chew, PA-C      Allergies    Patient has no known allergies.    Review of Systems   Review of Systems  Respiratory:  Positive for shortness of breath.  All other systems reviewed and are negative.   Physical Exam Updated Vital Signs BP (!) 123/91   Pulse (!) 109   Temp 98.4 F (36.9 C) (Oral)   Resp 18   SpO2 100%  Physical Exam Vitals and nursing note reviewed.  Constitutional:      General: He is not in acute distress.    Appearance: Normal appearance.  HENT:     Head: Normocephalic and atraumatic.  Eyes:     General:        Right eye: No discharge.        Left eye: No discharge.   Cardiovascular:     Rate and Rhythm: Regular rhythm. Tachycardia present.     Heart sounds: No murmur heard.    No friction rub. No gallop.  Pulmonary:     Effort: Pulmonary effort is normal.     Breath sounds: Normal breath sounds.     Comments: Diffuse wheezing through bilateral lung fields, increased work of breathing Abdominal:     General: Bowel sounds are normal.     Palpations: Abdomen is soft.  Skin:    General: Skin is warm and dry.     Capillary Refill: Capillary refill takes less than 2 seconds.  Neurological:     Mental Status: He is alert and oriented to person, place, and time.  Psychiatric:        Mood and Affect: Mood normal.        Behavior: Behavior normal.     ED Results / Procedures / Treatments   Labs (all labs ordered are listed, but only abnormal results are displayed) Labs Reviewed  CBC - Abnormal; Notable for the following components:      Result Value   WBC 17.2 (*)    All other components within normal limits  BASIC METABOLIC PANEL - Abnormal; Notable for the following components:   Glucose, Bld 118 (*)    All other components within normal limits  CULTURE, BLOOD (ROUTINE X 2)  CULTURE, BLOOD (ROUTINE X 2)    EKG None  Radiology DG Chest 2 View Result Date: 10/13/2023 CLINICAL DATA:  Shortness of breath . EXAM: CHEST - 2 VIEW COMPARISON:  01/19/2022 FINDINGS: Lungs are hyperexpanded. Right lung clear. Subtle focal airspace disease noted parahilar left lung. The cardiopericardial silhouette is within normal limits for size. No acute bony abnormality. Telemetry leads overlie the chest. IMPRESSION: Subtle focal airspace disease parahilar left lung. Imaging features compatible with pneumonia. Follow-up recommended to ensure resolution. Electronically Signed   By: Camellia Candle M.D.   On: 10/13/2023 07:25    Procedures Procedures    Medications Ordered in ED Medications  ipratropium-albuterol  (DUONEB) 0.5-2.5 (3) MG/3ML nebulizer solution 3 mL  (3 mLs Nebulization Given 10/14/23 0826)  methylPREDNISolone  sodium succinate (SOLU-MEDROL ) 125 mg/2 mL injection 125 mg (125 mg Intravenous Given 10/14/23 0856)  cefTRIAXone  (ROCEPHIN ) 1 g in sodium chloride  0.9 % 100 mL IVPB (0 g Intravenous Stopped 10/14/23 0935)  azithromycin  (ZITHROMAX ) 500 mg in sodium chloride  0.9 % 250 mL IVPB (500 mg Intravenous New Bag/Given 10/14/23 0854)  sodium chloride  0.9 % bolus 1,000 mL (1,000 mLs Intravenous New Bag/Given 10/14/23 0938)  albuterol  (PROVENTIL ) (2.5 MG/3ML) 0.083% nebulizer solution (2.5 mg/hr Nebulization Given 10/14/23 9061)    ED Course/ Medical Decision Making/ A&P                                 Medical  Decision Making Risk Prescription drug management.   This patient is a 48 y.o. male  who presents to the ED for concern of shob.   Differential diagnoses prior to evaluation: The emergent differential diagnosis includes, but is not limited to,  asthma exacerbation, COPD exacerbation, acute upper respiratory infection, acute bronchitis, chronic bronchitis, interstitial lung disease, ARDS, PE, pneumonia, atypical ACS, carbon monoxide poisoning, spontaneous pneumothorax, new CHF vs CHF exacerbation, versus other . This is not an exhaustive differential.   Past Medical History / Co-morbidities / Social History: bipolar disorder, PTSD, alcohol abuse, tobacco use, asthma  Additional history: Chart reviewed. Pertinent results include: Reviewed lab work, imaging from ED evaluation yesterday, plain film radiograph of the chest with perihilar density notable, suspicious for pneumonia, RVP negative for COVID, flu, RSV  Physical Exam: Physical exam performed. The pertinent findings include: bilateral wheezing, hypoxia, rhonchi, question left middle consolidation  Lab Tests/Imaging studies: I personally interpreted labs/imaging and the pertinent results include: Patient with leukocytosis, white blood cell 17.2, given his known pneumonia there is likely  some component of infectious etiology, but he has also been getting steroids for his asthma exacerbation. I agree with the radiologist interpretation.  Cardiac monitoring: EKG obtained and interpreted by myself and attending physician which shows: sinus tachycardia   Medications: I ordered medication including DuoNeb, continuous neb.  I have reviewed the patients home medicines and have made adjustments as needed.  Consults: I spoke with the hospitalist, Dr. Raenelle and after discussion of patient's lab work, clinical exam findings, and imaging they agreed to admission for acute hypoxic respiratory failure in the setting of asthma exacerbation, pneumonia.   Disposition: After consideration of the diagnostic results and the patients response to treatment, I feel that patient would benefit from admission as discussed above.   Final Clinical Impression(s) / ED Diagnoses Final diagnoses:  None    Rx / DC Orders ED Discharge Orders     None         Rosan Sherlean DEL, PA-C 10/14/23 1114    Theadore Ozell HERO, MD 10/18/23 [phone]

## 2023-10-14 NOTE — ED Notes (Signed)
 ED TO INPATIENT HANDOFF REPORT  ED Nurse Name and Phone #:   Lorn, [phone]  S Name/Age/Gender Gregory Bishop 48 y.o. male Room/Bed: 043C/043C  Code Status   Code Status: Full Code  Home/SNF/Other Home Patient oriented to: self, place, time, and situation Is this baseline? Yes   Triage Complete: Triage complete  Chief Complaint PNA (pneumonia) [J18.9]  Triage Note Patient with hx asthma here after being discharged from [facility] this morning for eval of continued sob. Has not picked up abx or steroids prescribed to him d/t the pharmacy being closed.    Allergies No Known Allergies  Level of Care/Admitting Diagnosis ED Disposition     ED Disposition  Admit   Condition  --   Comment  Hospital Area: [facility] [[id number]]  Level of Care: Telemetry Medical [104]  May admit patient to [facility] or [facility] if equivalent level of care is available:: No  Covid Evaluation: Confirmed COVID Positive  Diagnosis: PNA (pneumonia) [293732]  Admitting Physician: RAENELLE DONALDA HERO [[id number]]  Attending Physician: RAENELLE DONALDA HERO [[id number]]  Certification:: I certify this patient will need inpatient services for at least 2 midnights  Expected Medical Readiness: 10/16/2023          B Medical/Surgery History Past Medical History:  Diagnosis Date   Stroke [facility])    Past Surgical History:  Procedure Laterality Date   LEG SURGERY Left 2014     A IV Location/Drains/Wounds Patient Lines/Drains/Airways Status     Active Line/Drains/Airways     Name Placement date Placement time Site Days   Peripheral IV 10/14/23 20 G Antecubital 10/14/23  0846  Antecubital  less than 1            Intake/Output Last 24 hours No intake or output data in the 24 hours ending 10/14/23 1527  Labs/Imaging Results for orders placed or performed during the hospital encounter of 10/13/23 (from the past 48 hours)  CBC     Status: Abnormal   Collection Time: 10/14/23  9:43 AM   Result Value Ref Range   WBC 17.2 (H) 4.0 - 10.5 K/uL   RBC 5.12 4.22 - 5.81 MIL/uL   Hemoglobin 16.3 13.0 - 17.0 g/dL   HCT 52.0 60.9 - 47.9 %   MCV 93.6 80.0 - 100.0 fL   MCH 31.8 26.0 - 34.0 pg   MCHC 34.0 30.0 - 36.0 g/dL   RDW 87.4 88.4 - 84.4 %   Platelets 233 150 - 400 K/uL   nRBC 0.0 0.0 - 0.2 %    Comment: Performed at [facility], [address], [place], [place] [address]  Basic metabolic panel     Status: Abnormal   Collection Time: 10/14/23  9:43 AM  Result Value Ref Range   Sodium 139 135 - 145 mmol/L   Potassium 4.4 3.5 - 5.1 mmol/L    Comment: HEMOLYSIS AT THIS LEVEL MAY AFFECT RESULT   Chloride 102 98 - 111 mmol/L   CO2 27 22 - 32 mmol/L   Glucose, Bld 118 (H) 70 - 99 mg/dL    Comment: Glucose reference range applies only to samples taken after fasting for at least 8 hours.   BUN 7 6 - 20 mg/dL   Creatinine, Ser 9.10 0.61 - 1.24 mg/dL   Calcium  9.4 8.9 - 10.3 mg/dL   GFR, Estimated >39 >39 mL/min    Comment: (NOTE) Calculated using the CKD-EPI Creatinine Equation (2021)    Anion gap 10 5 -  15    Comment: Performed at [facility], [address], [place], [place] [address]  CBC     Status: Abnormal   Collection Time: 10/14/23  2:16 PM  Result Value Ref Range   WBC 13.3 (H) 4.0 - 10.5 K/uL   RBC 5.04 4.22 - 5.81 MIL/uL   Hemoglobin 15.9 13.0 - 17.0 g/dL   HCT 53.3 60.9 - 47.9 %   MCV 92.5 80.0 - 100.0 fL   MCH 31.5 26.0 - 34.0 pg   MCHC 34.1 30.0 - 36.0 g/dL   RDW 87.3 88.4 - 84.4 %   Platelets 204 150 - 400 K/uL   nRBC 0.0 0.0 - 0.2 %    Comment: Performed at [facility], [address] [place], [place] [address]  Creatinine, serum     Status: None   Collection Time: 10/14/23  2:16 PM  Result Value Ref Range   Creatinine, Ser 1.05 0.61 - 1.24 mg/dL   GFR, Estimated >39 >39 mL/min    Comment: (NOTE) Calculated using the CKD-EPI Creatinine Equation (2021) Performed at [facility], [address] [place],  [place] [address]    DG Chest 2 View Result Date: 10/13/2023 CLINICAL DATA:  Shortness of breath . EXAM: CHEST - 2 VIEW COMPARISON:  01/19/2022 FINDINGS: Lungs are hyperexpanded. Right lung clear. Subtle focal airspace disease noted parahilar left lung. The cardiopericardial silhouette is within normal limits for size. No acute bony abnormality. Telemetry leads overlie the chest. IMPRESSION: Subtle focal airspace disease parahilar left lung. Imaging features compatible with pneumonia. Follow-up recommended to ensure resolution. Electronically Signed   By: Camellia Candle M.D.   On: 10/13/2023 07:25    Pending Labs Unresulted Labs (From admission, onward)     Start     Ordered   10/21/23 0500  Creatinine, serum  (enoxaparin  (LOVENOX )    CrCl >/= 30 ml/min)  Weekly,   R     Comments: while on enoxaparin  therapy    10/14/23 1149   10/15/23 0500  Basic metabolic panel  Tomorrow morning,   R        10/14/23 1149   10/15/23 0500  CBC  Tomorrow morning,   R        10/14/23 1149   10/14/23 1152  Legionella Pneumophila Serogp 1 Ur Ag  Once,   R        10/14/23 1151   10/14/23 1152  Strep pneumoniae urinary antigen  Once,   R        10/14/23 1151   10/14/23 1147  HIV Antibody (routine testing w rflx)  (HIV Antibody (Routine testing w reflex) panel)  Once,   R        10/14/23 1149   10/14/23 1113  Culture, blood (Routine X 2) w Reflex to ID Panel  BLOOD CULTURE X 2,   R (with STAT occurrences)      10/14/23 1112            Vitals/Pain Today's Vitals   10/14/23 0821 10/14/23 0823 10/14/23 0830 10/14/23 1524  BP: (!) 130/92  (!) 123/91 123/79  Pulse: 98 (!) 107 (!) 109 86  Resp: (!) 22  18 20   Temp: 98.4 F (36.9 C)   98 F (36.7 C)  TempSrc: Oral   Oral  SpO2: 93% 97% 100% 92%  PainSc:        Isolation Precautions No active isolations  Medications Medications  enoxaparin  (LOVENOX ) injection 40 mg (40  mg Subcutaneous Given 10/14/23 1325)  sodium chloride  flush (NS) 0.9 % injection 3 mL (3  mLs Intravenous Given 10/14/23 1326)  sodium chloride  flush (NS) 0.9 % injection 3 mL (has no administration in time range)  0.9 %  sodium chloride  infusion (has no administration in time range)  acetaminophen  (TYLENOL ) tablet 650 mg (has no administration in time range)    Or  acetaminophen  (TYLENOL ) suppository 650 mg (has no administration in time range)  ondansetron  (ZOFRAN ) tablet 4 mg (has no administration in time range)    Or  ondansetron  (ZOFRAN ) injection 4 mg (has no administration in time range)  albuterol  (PROVENTIL ) (2.5 MG/3ML) 0.083% nebulizer solution 2.5 mg (2.5 mg Nebulization Given 10/14/23 1327)  ipratropium (ATROVENT ) nebulizer solution 0.5 mg (0.5 mg Nebulization Given 10/14/23 1411)  guaiFENesin  (MUCINEX ) 12 hr tablet 600 mg (600 mg Oral Given 10/14/23 1325)  budesonide  (PULMICORT ) nebulizer solution 0.25 mg (0.25 mg Nebulization Not Given 10/14/23 1411)  cefTRIAXone  (ROCEPHIN ) 2 g in sodium chloride  0.9 % 100 mL IVPB (has no administration in time range)  azithromycin  (ZITHROMAX ) 500 mg in sodium chloride  0.9 % 250 mL IVPB (has no administration in time range)  albuterol  (PROVENTIL ) (2.5 MG/3ML) 0.083% nebulizer solution 2.5 mg (has no administration in time range)  pantoprazole  (PROTONIX ) EC tablet 40 mg (40 mg Oral Given 10/14/23 1326)  alum & mag hydroxide-simeth (MAALOX/MYLANTA) 200-200-20 MG/5ML suspension 30 mL (has no administration in time range)  polyethylene glycol (MIRALAX  / GLYCOLAX ) packet 17 g (17 g Oral Given 10/14/23 1327)  cyanocobalamin  (VITAMIN B12) tablet 1,000 mcg (1,000 mcg Oral Given 10/14/23 1334)  naltrexone  (DEPADE) tablet 50 mg (has no administration in time range)  thiamine  (VITAMIN B1) tablet 100 mg (100 mg Oral Given 10/14/23 1409)  Vitamin D  (Ergocalciferol ) (DRISDOL ) 1.25 MG (50000 UNIT) capsule 50,000 Units (50,000 Units Oral Given 10/14/23 1410)  methylPREDNISolone  sodium succinate (SOLU-MEDROL ) 125 mg/2 mL injection 120 mg (has no administration in time  range)  ipratropium-albuterol  (DUONEB) 0.5-2.5 (3) MG/3ML nebulizer solution 3 mL (3 mLs Nebulization Given 10/14/23 0826)  methylPREDNISolone  sodium succinate (SOLU-MEDROL ) 125 mg/2 mL injection 125 mg (125 mg Intravenous Given 10/14/23 0856)  cefTRIAXone  (ROCEPHIN ) 1 g in sodium chloride  0.9 % 100 mL IVPB (0 g Intravenous Stopped 10/14/23 0935)  azithromycin  (ZITHROMAX ) 500 mg in sodium chloride  0.9 % 250 mL IVPB (0 mg Intravenous Stopped 10/14/23 1327)  sodium chloride  0.9 % bolus 1,000 mL (0 mLs Intravenous Stopped 10/14/23 1327)  albuterol  (PROVENTIL ) (2.5 MG/3ML) 0.083% nebulizer solution (2.5 mg/hr Nebulization Given 10/14/23 9061)    Mobility walks     Focused Assessments Pulmonary Assessment Handoff:  Lung sounds: Bilateral Breath Sounds: Inspiratory wheezes, Expiratory wheezes L Breath Sounds: Expiratory wheezes, Inspiratory wheezes R Breath Sounds: Expiratory wheezes, Inspiratory wheezes O2 Device: Nasal Cannula O2 Flow Rate (L/min): 2 L/min    R Recommendations: See Admitting Provider Note  Report given to:   Additional Notes: Pt is AOX4, walky talky, able to answer questions, continent, has some wheezing

## 2023-10-15 DIAGNOSIS — J189 Pneumonia, unspecified organism: Secondary | ICD-10-CM | POA: Diagnosis not present

## 2023-10-15 LAB — CBC
HCT: 45.2 % (ref 39.0–52.0)
Hemoglobin: 15.3 g/dL (ref 13.0–17.0)
MCH: 31.1 pg (ref 26.0–34.0)
MCHC: 33.8 g/dL (ref 30.0–36.0)
MCV: 91.9 fL (ref 80.0–100.0)
Platelets: 192 10*3/uL (ref 150–400)
RBC: 4.92 MIL/uL (ref 4.22–5.81)
RDW: 12.3 % (ref 11.5–15.5)
WBC: 14.1 10*3/uL — ABNORMAL HIGH (ref 4.0–10.5)
nRBC: 0 % (ref 0.0–0.2)

## 2023-10-15 LAB — BASIC METABOLIC PANEL
Anion gap: 8 (ref 5–15)
BUN: 14 mg/dL (ref 6–20)
CO2: 27 mmol/L (ref 22–32)
Calcium: 9.6 mg/dL (ref 8.9–10.3)
Chloride: 104 mmol/L (ref 98–111)
Creatinine, Ser: 1 mg/dL (ref 0.61–1.24)
GFR, Estimated: >60 mL/min (ref >60)
Glucose, Bld: 109 mg/dL — ABNORMAL HIGH (ref 70–99)
Potassium: 3.6 mmol/L (ref 3.5–5.1)
Sodium: 139 mmol/L (ref 135–145)

## 2023-10-15 MED ORDER — AZITHROMYCIN 500 MG PO TABS
500.0000 mg | ORAL_TABLET | Freq: Every day | ORAL | Status: DC
  Administered 2023-10-16 – 2023-10-18 (×3): 500 mg via ORAL
  Filled 2023-10-15 (×3): qty 1

## 2023-10-15 NOTE — Plan of Care (Signed)

## 2023-10-15 NOTE — Progress Notes (Signed)
 PROGRESS NOTE    Gregory Bishop  FMW:[id number] DOB: [birth year] DOA: 10/13/2023 PCP: Thedora Garnette HERO, MD    Brief Narrative:   Gregory Bishop is a 48 y.o. male with medical history significant of bronchial asthma, EtOH use-abstinent for the past 8 months-presented to the hospital for cough/shortness of breath x 3 days.   Per patient he was in his usual state of health-3 days back-started having shortness of breath that felt typical for his usual asthma flare.  He took his albuterol  inhaler without any relief.  He also started having productive cough with yellowish phlegm.  He presented to the ED yesterday-was discharged home on prednisone /Zithromax /amoxicillin -however his symptoms continue to worsen-as result he presented to the hospital today.   Patient denies any fever-claims that he never got better when he went home from the ED yesterday.  Denies any myalgias/arthralgias.  Per patient-his significant others family member has similar illness but no other sick exposures.   No headache No neck pain No chest pain No vomiting No diarrhea No hematuria No hematochezia     In the ED-he was found to have hypoxia requiring around 2-3 L of oxygen, chest x-ray showed infiltrate-he was found to be wheezing and an asthma flare-he was given antibiotics/steroids and the hospitalist service was asked to admit this patient for further evaluation and treatment.     Assessment and Plan: Community-acquired pneumonia Afebrile but does have leukocytosis (although given steroids yesterday) Nontoxic-appearing-appears stable Continue Rocephin /Zithromax  Follow culture data   Bronchial asthma exacerbation Already feeling better after treatment in the ED Moving air with coarse rhonchi all over with occasional wheeze IV steroids/scheduled bronchodilators   History of vitamin B12 deficiency Continue supplementation   Prior history of EtOH use Has been clean x 8 months Continue naltrexone    DVT  prophylaxis: enoxaparin  (LOVENOX ) injection 40 mg Start: 10/14/23 1300    Code Status: Full Code Family Communication:   Disposition Plan:  Level of care: Telemetry Medical Status is: Inpatient     Consultants:  none   Subjective: Not feeling fully better yet  Objective: Vitals:   10/15/23 0006 10/15/23 0500 10/15/23 0727 10/15/23 0835  BP: 122/75 121/84 117/84   Pulse: 69 85 87   Resp: 20 18 17    Temp: 98.5 F (36.9 C) 97.9 F (36.6 C) 98.5 F (36.9 C)   TempSrc: Oral Oral Oral   SpO2:  91% 92% 94%  Weight:      Height:        Intake/Output Summary (Last 24 hours) at 10/15/2023 1125 Last data filed at 10/15/2023 0340 Gross per 24 hour  Intake 103 ml  Output --  Net 103 ml   Filed Weights   10/14/23 1618  Weight: 62.9 kg    Examination:   General: Appearance:    Thin male in no acute distress     Lungs:     Coarse with occ. Wheeze, respirations unlabored  Heart:    Normal heart rate.    MS:   All extremities are intact.    Neurologic:   Awake, alert       Data Reviewed: I have personally reviewed following labs and imaging studies  CBC: Recent Labs  Lab 10/14/23 0943 10/14/23 1416 10/15/23 0756  WBC 17.2* 13.3* 14.1*  HGB 16.3 15.9 15.3  HCT 47.9 46.6 45.2  MCV 93.6 92.5 91.9  PLT 233 204 192   Basic Metabolic Panel: Recent Labs  Lab 10/14/23 0943 10/14/23 1416 10/15/23 0756  NA 139  --  139  K 4.4  --  3.6  CL 102  --  104  CO2 27  --  27  GLUCOSE 118*  --  109*  BUN 7  --  14  CREATININE 0.89 1.05 1.00  CALCIUM  9.4  --  9.6   GFR: Estimated Creatinine Clearance: 79.5 mL/min (by C-G formula based on SCr of 1 mg/dL). Liver Function Tests: No results for input(s): AST, ALT, ALKPHOS, BILITOT, PROT, ALBUMIN in the last 168 hours. No results for input(s): LIPASE, AMYLASE in the last 168 hours. No results for input(s): AMMONIA in the last 168 hours. Coagulation Profile: No results for input(s): INR, PROTIME  in the last 168 hours. Cardiac Enzymes: No results for input(s): CKTOTAL, CKMB, CKMBINDEX, TROPONINI in the last 168 hours. BNP (last 3 results) No results for input(s): PROBNP in the last 8760 hours. HbA1C: No results for input(s): HGBA1C in the last 72 hours. CBG: No results for input(s): GLUCAP in the last 168 hours. Lipid Profile: No results for input(s): CHOL, HDL, LDLCALC, TRIG, CHOLHDL, LDLDIRECT in the last 72 hours. Thyroid  Function Tests: No results for input(s): TSH, T4TOTAL, FREET4, T3FREE, THYROIDAB in the last 72 hours. Anemia Panel: No results for input(s): VITAMINB12, FOLATE, FERRITIN, TIBC, IRON, RETICCTPCT in the last 72 hours. Sepsis Labs: No results for input(s): PROCALCITON, LATICACIDVEN in the last 168 hours.  Recent Results (from the past 240 hours)  Resp panel by RT-PCR (RSV, Flu A&B, Covid) Anterior Nasal Swab     Status: None   Collection Time: 10/13/23  8:32 AM   Specimen: Anterior Nasal Swab  Result Value Ref Range Status   SARS Coronavirus 2 by RT PCR NEGATIVE NEGATIVE Final    Comment: (NOTE) SARS-CoV-2 target nucleic acids are NOT DETECTED.  The SARS-CoV-2 RNA is generally detectable in upper respiratory specimens during the acute phase of infection. The lowest concentration of SARS-CoV-2 viral copies this assay can detect is 138 copies/mL. A negative result does not preclude SARS-Cov-2 infection and should not be used as the sole basis for treatment or other patient management decisions. A negative result may occur with  improper specimen collection/handling, submission of specimen other than nasopharyngeal swab, presence of viral mutation(s) within the areas targeted by this assay, and inadequate number of viral copies(<138 copies/mL). A negative result must be combined with clinical observations, patient history, and epidemiological information. The expected result is Negative.  Fact Sheet  for Patients:  bloggercourse.com  Fact Sheet for Healthcare Providers:  seriousbroker.it  This test is no t yet approved or cleared by the [place]  FDA and  has been authorized for detection and/or diagnosis of SARS-CoV-2 by FDA under an Emergency Use Authorization (EUA). This EUA will remain  in effect (meaning this test can be used) for the duration of the COVID-19 declaration under Section 564(b)(1) of the Act, 21 U.S.C.section 360bbb-3(b)(1), unless the authorization is terminated  or revoked sooner.       Influenza A by PCR NEGATIVE NEGATIVE Final   Influenza B by PCR NEGATIVE NEGATIVE Final    Comment: (NOTE) The Xpert Xpress SARS-CoV-2/FLU/RSV plus assay is intended as an aid in the diagnosis of influenza from Nasopharyngeal swab specimens and should not be used as a sole basis for treatment. Nasal washings and aspirates are unacceptable for Xpert Xpress SARS-CoV-2/FLU/RSV testing.  Fact Sheet for Patients: bloggercourse.com  Fact Sheet for Healthcare Providers: seriousbroker.it  This test is not yet approved or cleared by the [place]  FDA and has been authorized for  detection and/or diagnosis of SARS-CoV-2 by FDA under an Emergency Use Authorization (EUA). This EUA will remain in effect (meaning this test can be used) for the duration of the COVID-19 declaration under Section 564(b)(1) of the Act, 21 U.S.C. section 360bbb-3(b)(1), unless the authorization is terminated or revoked.     Resp Syncytial Virus by PCR NEGATIVE NEGATIVE Final    Comment: (NOTE) Fact Sheet for Patients: bloggercourse.com  Fact Sheet for Healthcare Providers: seriousbroker.it  This test is not yet approved or cleared by the [place]  FDA and has been authorized for detection and/or diagnosis of SARS-CoV-2 by FDA under an  Emergency Use Authorization (EUA). This EUA will remain in effect (meaning this test can be used) for the duration of the COVID-19 declaration under Section 564(b)(1) of the Act, 21 U.S.C. section 360bbb-3(b)(1), unless the authorization is terminated or revoked.  Performed at [facility], [address], [place], [place] [address]          Radiology Studies: No results found.      Scheduled Meds:  albuterol   2.5 mg Nebulization Q6H   budesonide  (PULMICORT ) nebulizer solution  0.25 mg Nebulization BID   cyanocobalamin   1,000 mcg Oral Daily   enoxaparin  (LOVENOX ) injection  40 mg Subcutaneous Daily   guaiFENesin   600 mg Oral BID   ipratropium  0.5 mg Nebulization Q6H   methylPREDNISolone  (SOLU-MEDROL ) injection  120 mg Intravenous Daily   naltrexone   50 mg Oral QHS   pantoprazole   40 mg Oral Q1200   polyethylene glycol  17 g Oral Daily   sodium chloride  flush  3 mL Intravenous Q12H   thiamine   100 mg Oral Daily   Vitamin D  (Ergocalciferol )  50,000 Units Oral Q7 days   Continuous Infusions:  sodium chloride      azithromycin  500 mg (10/15/23 0921)   cefTRIAXone  (ROCEPHIN )  IV Stopped (10/14/23 2040)     LOS: 1 day    Time spent: 45 minutes spent on chart review, discussion with nursing staff, consultants, updating family and interview/physical exam; more than 50% of that time was spent in counseling and/or coordination of care.    Harlene RAYMOND Bowl, DO [facility] Available via Epic secure chat 7am-7pm After these hours, please refer to coverage provider listed on [email or web address] 10/15/2023, 11:25 AM

## 2023-10-16 DIAGNOSIS — J189 Pneumonia, unspecified organism: Secondary | ICD-10-CM | POA: Diagnosis not present

## 2023-10-16 DIAGNOSIS — R0902 Hypoxemia: Secondary | ICD-10-CM

## 2023-10-16 LAB — BASIC METABOLIC PANEL
Anion gap: 10 (ref 5–15)
BUN: 16 mg/dL (ref 6–20)
CO2: 25 mmol/L (ref 22–32)
Calcium: 9.4 mg/dL (ref 8.9–10.3)
Chloride: 103 mmol/L (ref 98–111)
Creatinine, Ser: 0.87 mg/dL (ref 0.61–1.24)
GFR, Estimated: >60 mL/min (ref >60)
Glucose, Bld: 123 mg/dL — ABNORMAL HIGH (ref 70–99)
Potassium: 3.6 mmol/L (ref 3.5–5.1)
Sodium: 138 mmol/L (ref 135–145)

## 2023-10-16 LAB — LEGIONELLA PNEUMOPHILA SEROGP 1 UR AG: L. pneumophila Serogp 1 Ur Ag: NEGATIVE

## 2023-10-16 LAB — CBC
HCT: 42.8 % (ref 39.0–52.0)
Hemoglobin: 14.5 g/dL (ref 13.0–17.0)
MCH: 31 pg (ref 26.0–34.0)
MCHC: 33.9 g/dL (ref 30.0–36.0)
MCV: 91.5 fL (ref 80.0–100.0)
Platelets: 274 10*3/uL (ref 150–400)
RBC: 4.68 MIL/uL (ref 4.22–5.81)
RDW: 12.3 % (ref 11.5–15.5)
WBC: 13.6 10*3/uL — ABNORMAL HIGH (ref 4.0–10.5)
nRBC: 0 % (ref 0.0–0.2)

## 2023-10-16 MED ORDER — IPRATROPIUM-ALBUTEROL 0.5-2.5 (3) MG/3ML IN SOLN: 3.0000 mL | Freq: Four times a day (QID) | RESPIRATORY_TRACT | Status: DC

## 2023-10-16 MED ORDER — OLANZAPINE 10 MG PO TABS
10.0000 mg | ORAL_TABLET | Freq: Every day | ORAL | Status: DC
  Administered 2023-10-16 – 2023-10-17 (×2): 10 mg via ORAL
  Filled 2023-10-16 (×2): qty 1

## 2023-10-16 MED ORDER — IPRATROPIUM-ALBUTEROL 0.5-2.5 (3) MG/3ML IN SOLN
3.0000 mL | Freq: Four times a day (QID) | RESPIRATORY_TRACT | Status: DC
  Administered 2023-10-16 – 2023-10-17 (×4): 3 mL via RESPIRATORY_TRACT
  Filled 2023-10-16 (×4): qty 3

## 2023-10-16 MED ORDER — MIRTAZAPINE 15 MG PO TABS
15.0000 mg | ORAL_TABLET | Freq: Every day | ORAL | Status: DC
  Administered 2023-10-16 – 2023-10-17 (×2): 15 mg via ORAL
  Filled 2023-10-16 (×2): qty 1

## 2023-10-16 MED ORDER — GABAPENTIN 100 MG PO CAPS
100.0000 mg | ORAL_CAPSULE | Freq: Every day | ORAL | Status: DC
  Administered 2023-10-16 – 2023-10-18 (×3): 100 mg via ORAL
  Filled 2023-10-16 (×3): qty 1

## 2023-10-16 NOTE — Plan of Care (Signed)

## 2023-10-16 NOTE — Progress Notes (Signed)
 PROGRESS NOTE    Gregory Bishop  FMW:[id number] DOB: 1976/09/17 DOA: 10/13/2023 PCP: Thedora Garnette HERO, MD    Brief Narrative:   Gregory Bishop is a 48 y.o. male with medical history significant of bronchial asthma, EtOH use-abstinent for the past 8 months-presented to the hospital for cough/shortness of breath x 3 days.   Per patient he was in his usual state of health-3 days back-started having shortness of breath that felt typical for his usual asthma flare.  He took his albuterol  inhaler without any relief.  He also started having productive cough with yellowish phlegm.  He presented to the ED yesterday-was discharged home on prednisone /Zithromax /amoxicillin -however his symptoms continue to worsen-as result he presented to the hospital today.   Patient denies any fever-claims that he never got better when he went home from the ED yesterday.  Denies any myalgias/arthralgias.  Per patient-his significant others family member has similar illness but no other sick exposures.   No headache No neck pain No chest pain No vomiting No diarrhea No hematuria No hematochezia     In the ED-he was found to have hypoxia requiring around 2-3 L of oxygen, chest x-ray showed infiltrate-he was found to be wheezing and an asthma flare-he was given antibiotics/steroids and the hospitalist service was asked to admit this patient for further evaluation and treatment.     Assessment and Plan: Community-acquired pneumonia Afebrile but does have leukocytosis (although given steroids yesterday) Nontoxic-appearing-appears stable Continue Rocephin /Zithromax  Follow culture data   Bronchial asthma exacerbation Already feeling better after treatment in the ED Still with wheeze IV steroids/scheduled bronchodilators Home O2 Flutter valve   History of vitamin B12 deficiency Continue supplementation   Prior history of EtOH use Has been clean x 8 months Continue naltrexone    DVT prophylaxis: enoxaparin   (LOVENOX ) injection 40 mg Start: 10/14/23 1300    Code Status: Full Code Family Communication: girlfriend  Disposition Plan:  Level of care: Med-Surg Status is: Inpatient     Consultants:  none   Subjective: Still with SOB with exertion  Objective: Vitals:   10/15/23 2128 10/16/23 0222 10/16/23 0500 10/16/23 0808  BP: 124/81  117/79 119/86  Pulse: 90  83 80  Resp:   18 20  Temp:   98 F (36.7 C) 98 F (36.7 C)  TempSrc:   Oral Oral  SpO2: 91% 92% 94% 99%  Weight:      Height:        Intake/Output Summary (Last 24 hours) at 10/16/2023 1031 Last data filed at 10/16/2023 0823 Gross per 24 hour  Intake 120 ml  Output --  Net 120 ml   Filed Weights   10/14/23 1618  Weight: 62.9 kg    Examination:   General: Appearance:    Thin male in no acute distress     Lungs:     Coarse with occ. Wheeze, respirations unlabored  Heart:    Normal heart rate.    MS:   All extremities are intact.    Neurologic:   Awake, alert       Data Reviewed: I have personally reviewed following labs and imaging studies  CBC: Recent Labs  Lab 10/14/23 0943 10/14/23 1416 10/15/23 0756 10/16/23 0702  WBC 17.2* 13.3* 14.1* 13.6*  HGB 16.3 15.9 15.3 14.5  HCT 47.9 46.6 45.2 42.8  MCV 93.6 92.5 91.9 91.5  PLT 233 204 192 274   Basic Metabolic Panel: Recent Labs  Lab 10/14/23 0943 10/14/23 1416 10/15/23 0756 10/16/23 0702  NA 139  --  139 138  K 4.4  --  3.6 3.6  CL 102  --  104 103  CO2 27  --  27 25  GLUCOSE 118*  --  109* 123*  BUN 7  --  14 16  CREATININE 0.89 1.05 1.00 0.87  CALCIUM  9.4  --  9.6 9.4   GFR: Estimated Creatinine Clearance: 91.4 mL/min (by C-G formula based on SCr of 0.87 mg/dL). Liver Function Tests: No results for input(s): AST, ALT, ALKPHOS, BILITOT, PROT, ALBUMIN in the last 168 hours. No results for input(s): LIPASE, AMYLASE in the last 168 hours. No results for input(s): AMMONIA in the last 168 hours. Coagulation  Profile: No results for input(s): INR, PROTIME in the last 168 hours. Cardiac Enzymes: No results for input(s): CKTOTAL, CKMB, CKMBINDEX, TROPONINI in the last 168 hours. BNP (last 3 results) No results for input(s): PROBNP in the last 8760 hours. HbA1C: No results for input(s): HGBA1C in the last 72 hours. CBG: No results for input(s): GLUCAP in the last 168 hours. Lipid Profile: No results for input(s): CHOL, HDL, LDLCALC, TRIG, CHOLHDL, LDLDIRECT in the last 72 hours. Thyroid  Function Tests: No results for input(s): TSH, T4TOTAL, FREET4, T3FREE, THYROIDAB in the last 72 hours. Anemia Panel: No results for input(s): VITAMINB12, FOLATE, FERRITIN, TIBC, IRON, RETICCTPCT in the last 72 hours. Sepsis Labs: No results for input(s): PROCALCITON, LATICACIDVEN in the last 168 hours.  Recent Results (from the past 240 hours)  Resp panel by RT-PCR (RSV, Flu A&B, Covid) Anterior Nasal Swab     Status: None   Collection Time: 10/13/23  8:32 AM   Specimen: Anterior Nasal Swab  Result Value Ref Range Status   SARS Coronavirus 2 by RT PCR NEGATIVE NEGATIVE Final    Comment: (NOTE) SARS-CoV-2 target nucleic acids are NOT DETECTED.  The SARS-CoV-2 RNA is generally detectable in upper respiratory specimens during the acute phase of infection. The lowest concentration of SARS-CoV-2 viral copies this assay can detect is 138 copies/mL. A negative result does not preclude SARS-Cov-2 infection and should not be used as the sole basis for treatment or other patient management decisions. A negative result may occur with  improper specimen collection/handling, submission of specimen other than nasopharyngeal swab, presence of viral mutation(s) within the areas targeted by this assay, and inadequate number of viral copies(<138 copies/mL). A negative result must be combined with clinical observations, patient history, and  epidemiological information. The expected result is Negative.  Fact Sheet for Patients:  bloggercourse.com  Fact Sheet for Healthcare Providers:  seriousbroker.it  This test is no t yet approved or cleared by the [place]  FDA and  has been authorized for detection and/or diagnosis of SARS-CoV-2 by FDA under an Emergency Use Authorization (EUA). This EUA will remain  in effect (meaning this test can be used) for the duration of the COVID-19 declaration under Section 564(b)(1) of the Act, 21 U.S.C.section 360bbb-3(b)(1), unless the authorization is terminated  or revoked sooner.       Influenza A by PCR NEGATIVE NEGATIVE Final   Influenza B by PCR NEGATIVE NEGATIVE Final    Comment: (NOTE) The Xpert Xpress SARS-CoV-2/FLU/RSV plus assay is intended as an aid in the diagnosis of influenza from Nasopharyngeal swab specimens and should not be used as a sole basis for treatment. Nasal washings and aspirates are unacceptable for Xpert Xpress SARS-CoV-2/FLU/RSV testing.  Fact Sheet for Patients: bloggercourse.com  Fact Sheet for Healthcare Providers: seriousbroker.it  This test is not yet approved or cleared by the  [place]  FDA and has been authorized for detection and/or diagnosis of SARS-CoV-2 by FDA under an Emergency Use Authorization (EUA). This EUA will remain in effect (meaning this test can be used) for the duration of the COVID-19 declaration under Section 564(b)(1) of the Act, 21 U.S.C. section 360bbb-3(b)(1), unless the authorization is terminated or revoked.     Resp Syncytial Virus by PCR NEGATIVE NEGATIVE Final    Comment: (NOTE) Fact Sheet for Patients: bloggercourse.com  Fact Sheet for Healthcare Providers: seriousbroker.it  This test is not yet approved or cleared by the [place]  FDA and has been  authorized for detection and/or diagnosis of SARS-CoV-2 by FDA under an Emergency Use Authorization (EUA). This EUA will remain in effect (meaning this test can be used) for the duration of the COVID-19 declaration under Section 564(b)(1) of the Act, 21 U.S.C. section 360bbb-3(b)(1), unless the authorization is terminated or revoked.  Performed at [facility], [address] [place], [place] [address]          Radiology Studies: No results found.      Scheduled Meds:  azithromycin   500 mg Oral Daily   budesonide  (PULMICORT ) nebulizer solution  0.25 mg Nebulization BID   cyanocobalamin   1,000 mcg Oral Daily   enoxaparin  (LOVENOX ) injection  40 mg Subcutaneous Daily   gabapentin   100 mg Oral Daily   guaiFENesin   600 mg Oral BID   ipratropium-albuterol   3 mL Nebulization Q6H   methylPREDNISolone  (SOLU-MEDROL ) injection  120 mg Intravenous Daily   mirtazapine   15 mg Oral QHS   naltrexone   50 mg Oral QHS   OLANZapine   10 mg Oral QHS   pantoprazole   40 mg Oral Q1200   polyethylene glycol  17 g Oral Daily   thiamine   100 mg Oral Daily   Vitamin D  (Ergocalciferol )  50,000 Units Oral Q7 days   Continuous Infusions:  cefTRIAXone  (ROCEPHIN )  IV 2 g (10/15/23 1936)     LOS: 2 days    Time spent: 45 minutes spent on chart review, discussion with nursing staff, consultants, updating family and interview/physical exam; more than 50% of that time was spent in counseling and/or coordination of care.    Harlene RAYMOND Bowl, DO [facility] Available via Epic secure chat 7am-7pm After these hours, please refer to coverage provider listed on [email or web address] 10/16/2023, 10:31 AM

## 2023-10-17 DIAGNOSIS — J189 Pneumonia, unspecified organism: Secondary | ICD-10-CM | POA: Diagnosis not present

## 2023-10-17 MED ORDER — PREDNISONE 20 MG PO TABS
40.0000 mg | ORAL_TABLET | Freq: Every day | ORAL | Status: DC
  Administered 2023-10-18: 40 mg via ORAL
  Filled 2023-10-17: qty 2

## 2023-10-17 MED ORDER — IPRATROPIUM-ALBUTEROL 0.5-2.5 (3) MG/3ML IN SOLN
3.0000 mL | Freq: Two times a day (BID) | RESPIRATORY_TRACT | Status: DC
  Administered 2023-10-17 – 2023-10-18 (×2): 3 mL via RESPIRATORY_TRACT
  Filled 2023-10-17: qty 3

## 2023-10-17 NOTE — Progress Notes (Signed)
 Mobility Specialist Progress Note:   10/17/23 1100  Oxygen Therapy  O2 Device Room Air  Mobility  Activity Ambulated independently in hallway  Level of Assistance Standby assist, set-up cues, supervision of patient - no hands on  Assistive Device None  Distance Ambulated (ft) 250 ft  Activity Response Tolerated well  Mobility Referral Yes  Mobility visit 1 Mobility  Mobility Specialist Start Time (ACUTE ONLY) 1037  Mobility Specialist Stop Time (ACUTE ONLY) 1047  Mobility Specialist Time Calculation (min) (ACUTE ONLY) 10 min    Pre Mobility: 94% SpO2 During Mobility:  92% SpO2 Post Mobility:  93% SpO2  Pt received in bed and agreeable. C/o some dizziness, stating this is normal when he is just waking up. Denied any SOB throughout w/ SpO2 levels in 90s on room air. Pt left in bed with call bell and all needs met. RN aware.  D'Vante Nicholaus Mobility Specialist Please contact via Special Educational Needs Teacher or Rehab office at [phone]

## 2023-10-17 NOTE — Progress Notes (Signed)
 Nurse requested Mobility Specialist to perform oxygen saturation test with pt which includes removing pt from oxygen both at rest and while ambulating.  Below are the results from that testing.     Patient Saturations on Room Air at Rest = spO2 94%  Patient Saturations on Room Air while Ambulating = sp02 92% .    At end of testing pt left in room on room air.  Reported results to nurse.   D'Vante Nicholaus Mobility Specialist Please contact via Special Educational Needs Teacher or Rehab office at [phone]

## 2023-10-17 NOTE — Progress Notes (Signed)
 PROGRESS NOTE    Gregory Bishop  FMW:[id number] DOB: 24-Jul-1976 DOA: 10/13/2023 PCP: Thedora Garnette HERO, MD    Brief Narrative:   Gregory Bishop is a 48 y.o. male with medical history significant of bronchial asthma, EtOH use-abstinent for the past 8 months-presented to the hospital for cough/shortness of breath x 3 days.   Per patient he was in his usual state of health-3 days back-started having shortness of breath that felt typical for his usual asthma flare.  He took his albuterol  inhaler without any relief.  He also started having productive cough with yellowish phlegm.  He presented to the ED yesterday-was discharged home on prednisone /Zithromax /amoxicillin -however his symptoms continue to worsen-as result he presented to the hospital today.   Patient denies any fever-claims that he never got better when he went home from the ED yesterday.  Denies any myalgias/arthralgias.  Per patient-his significant others family member has similar illness but no other sick exposures.  In the ED-he was found to have hypoxia requiring around 2-3 L of oxygen, chest x-ray showed infiltrate-he was found to be wheezing and an asthma flare-he was given antibiotics/steroids and the hospitalist service was asked to admit this patient for further evaluation and treatment.     Assessment and Plan: Community-acquired pneumonia Afebrile but does have leukocytosis-- trending down Nontoxic-appearing-appears stable Continue Rocephin /Zithromax  Follow culture data Home O2 eval   Bronchial asthma exacerbation Already feeling better after treatment in the ED Still with wheeze IV steroids-- transition to Po in AM/scheduled bronchodilators Home O2 Flutter valve   History of vitamin B12 deficiency Continue supplementation   Prior history of EtOH use Has been clean x 8 months Continue naltrexone    DVT prophylaxis: enoxaparin  (LOVENOX ) injection 40 mg Start: 10/14/23 1300    Code Status: Full Code Family  Communication: girlfriend  Disposition Plan:  Level of care: Med-Surg Status is: Inpatient     Consultants:  none   Subjective: Fine at rest-- very SOB with exertion  Objective: Vitals:   10/16/23 1958 10/17/23 0500 10/17/23 0724 10/17/23 0845  BP: (!) 142/93 114/79 128/80   Pulse: 77 64 66 66  Resp: 18 18 18 18   Temp: 98.3 F (36.8 C) 98.5 F (36.9 C) 98.3 F (36.8 C)   TempSrc: Oral Oral    SpO2: 94% 94% 95% 95%  Weight:      Height:        Intake/Output Summary (Last 24 hours) at 10/17/2023 0938 Last data filed at 10/17/2023 0306 Gross per 24 hour  Intake 436 ml  Output --  Net 436 ml   Filed Weights   10/14/23 1618  Weight: 62.9 kg    Examination:    General: Appearance:    Thin male in no acute distress     Lungs:    respirations unlabored  Heart:    Normal heart rate.   MS:   All extremities are intact.   Neurologic:   Awake, alert      Data Reviewed: I have personally reviewed following labs and imaging studies  CBC: Recent Labs  Lab 10/14/23 0943 10/14/23 1416 10/15/23 0756 10/16/23 0702  WBC 17.2* 13.3* 14.1* 13.6*  HGB 16.3 15.9 15.3 14.5  HCT 47.9 46.6 45.2 42.8  MCV 93.6 92.5 91.9 91.5  PLT 233 204 192 274   Basic Metabolic Panel: Recent Labs  Lab 10/14/23 0943 10/14/23 1416 10/15/23 0756 10/16/23 0702  NA 139  --  139 138  K 4.4  --  3.6 3.6  CL 102  --  104 103  CO2 27  --  27 25  GLUCOSE 118*  --  109* 123*  BUN 7  --  14 16  CREATININE 0.89 1.05 1.00 0.87  CALCIUM  9.4  --  9.6 9.4   GFR: Estimated Creatinine Clearance: 91.4 mL/min (by C-G formula based on SCr of 0.87 mg/dL). Liver Function Tests: No results for input(s): AST, ALT, ALKPHOS, BILITOT, PROT, ALBUMIN in the last 168 hours. No results for input(s): LIPASE, AMYLASE in the last 168 hours. No results for input(s): AMMONIA in the last 168 hours. Coagulation Profile: No results for input(s): INR, PROTIME in the last 168  hours. Cardiac Enzymes: No results for input(s): CKTOTAL, CKMB, CKMBINDEX, TROPONINI in the last 168 hours. BNP (last 3 results) No results for input(s): PROBNP in the last 8760 hours. HbA1C: No results for input(s): HGBA1C in the last 72 hours. CBG: No results for input(s): GLUCAP in the last 168 hours. Lipid Profile: No results for input(s): CHOL, HDL, LDLCALC, TRIG, CHOLHDL, LDLDIRECT in the last 72 hours. Thyroid  Function Tests: No results for input(s): TSH, T4TOTAL, FREET4, T3FREE, THYROIDAB in the last 72 hours. Anemia Panel: No results for input(s): VITAMINB12, FOLATE, FERRITIN, TIBC, IRON, RETICCTPCT in the last 72 hours. Sepsis Labs: No results for input(s): PROCALCITON, LATICACIDVEN in the last 168 hours.  Recent Results (from the past 240 hours)  Resp panel by RT-PCR (RSV, Flu A&B, Covid) Anterior Nasal Swab     Status: None   Collection Time: 10/13/23  8:32 AM   Specimen: Anterior Nasal Swab  Result Value Ref Range Status   SARS Coronavirus 2 by RT PCR NEGATIVE NEGATIVE Final    Comment: (NOTE) SARS-CoV-2 target nucleic acids are NOT DETECTED.  The SARS-CoV-2 RNA is generally detectable in upper respiratory specimens during the acute phase of infection. The lowest concentration of SARS-CoV-2 viral copies this assay can detect is 138 copies/mL. A negative result does not preclude SARS-Cov-2 infection and should not be used as the sole basis for treatment or other patient management decisions. A negative result may occur with  improper specimen collection/handling, submission of specimen other than nasopharyngeal swab, presence of viral mutation(s) within the areas targeted by this assay, and inadequate number of viral copies(<138 copies/mL). A negative result must be combined with clinical observations, patient history, and epidemiological information. The expected result is Negative.  Fact Sheet for Patients:   bloggercourse.com  Fact Sheet for Healthcare Providers:  seriousbroker.it  This test is no t yet approved or cleared by the [place]  FDA and  has been authorized for detection and/or diagnosis of SARS-CoV-2 by FDA under an Emergency Use Authorization (EUA). This EUA will remain  in effect (meaning this test can be used) for the duration of the COVID-19 declaration under Section 564(b)(1) of the Act, 21 U.S.C.section 360bbb-3(b)(1), unless the authorization is terminated  or revoked sooner.       Influenza A by PCR NEGATIVE NEGATIVE Final   Influenza B by PCR NEGATIVE NEGATIVE Final    Comment: (NOTE) The Xpert Xpress SARS-CoV-2/FLU/RSV plus assay is intended as an aid in the diagnosis of influenza from Nasopharyngeal swab specimens and should not be used as a sole basis for treatment. Nasal washings and aspirates are unacceptable for Xpert Xpress SARS-CoV-2/FLU/RSV testing.  Fact Sheet for Patients: bloggercourse.com  Fact Sheet for Healthcare Providers: seriousbroker.it  This test is not yet approved or cleared by the [place]  FDA and has been authorized for detection and/or diagnosis of SARS-CoV-2 by FDA under  an Emergency Use Authorization (EUA). This EUA will remain in effect (meaning this test can be used) for the duration of the COVID-19 declaration under Section 564(b)(1) of the Act, 21 U.S.C. section 360bbb-3(b)(1), unless the authorization is terminated or revoked.     Resp Syncytial Virus by PCR NEGATIVE NEGATIVE Final    Comment: (NOTE) Fact Sheet for Patients: bloggercourse.com  Fact Sheet for Healthcare Providers: seriousbroker.it  This test is not yet approved or cleared by the [place]  FDA and has been authorized for detection and/or diagnosis of SARS-CoV-2 by FDA under an Emergency Use  Authorization (EUA). This EUA will remain in effect (meaning this test can be used) for the duration of the COVID-19 declaration under Section 564(b)(1) of the Act, 21 U.S.C. section 360bbb-3(b)(1), unless the authorization is terminated or revoked.  Performed at [facility], [address] [place], [place] [address]   Culture, blood (Routine X 2) w Reflex to ID Panel     Status: None (Preliminary result)   Collection Time: 10/14/23  4:43 PM   Specimen: BLOOD  Result Value Ref Range Status   Specimen Description BLOOD SITE NOT SPECIFIED  Final   Special Requests   Final    BOTTLES DRAWN AEROBIC AND ANAEROBIC Blood Culture results may not be optimal due to an inadequate volume of blood received in culture bottles   Culture   Final    NO GROWTH 3 DAYS Performed at [facility], [address], [place], [place] [address]    Report Status PENDING  Incomplete  Culture, blood (Routine X 2) w Reflex to ID Panel     Status: None (Preliminary result)   Collection Time: 10/14/23  4:43 PM   Specimen: BLOOD  Result Value Ref Range Status   Specimen Description BLOOD SITE NOT SPECIFIED  Final   Special Requests   Final    BOTTLES DRAWN AEROBIC AND ANAEROBIC Blood Culture results may not be optimal due to an inadequate volume of blood received in culture bottles   Culture   Final    NO GROWTH 3 DAYS Performed at [facility], [address] [place], [place] [address]    Report Status PENDING  Incomplete         Radiology Studies: No results found.      Scheduled Meds:  azithromycin   500 mg Oral Daily   budesonide  (PULMICORT ) nebulizer solution  0.25 mg Nebulization BID   cyanocobalamin   1,000 mcg Oral Daily   enoxaparin  (LOVENOX ) injection  40 mg Subcutaneous Daily   gabapentin   100 mg Oral Daily   guaiFENesin   600 mg Oral BID   ipratropium-albuterol   3 mL Nebulization BID   methylPREDNISolone  (SOLU-MEDROL ) injection  120 mg Intravenous Daily    mirtazapine   15 mg Oral QHS   naltrexone   50 mg Oral QHS   OLANZapine   10 mg Oral QHS   pantoprazole   40 mg Oral Q1200   polyethylene glycol  17 g Oral Daily   thiamine   100 mg Oral Daily   Vitamin D  (Ergocalciferol )  50,000 Units Oral Q7 days   Continuous Infusions:  cefTRIAXone  (ROCEPHIN )  IV 2 g (10/16/23 1937)     LOS: 3 days    Time spent: 45 minutes spent on chart review, discussion with nursing staff, consultants, updating family and interview/physical exam; more than 50% of that time was spent in counseling and/or coordination of care.    Harlene RAYMOND Bowl, DO [facility] Available via Epic  secure chat 7am-7pm After these hours, please refer to coverage provider listed on [email or web address] 10/17/2023, 9:38 AM

## 2023-10-17 NOTE — Plan of Care (Signed)
   Problem: Activity: Goal: Risk for activity intolerance will decrease Outcome: Progressing   Problem: Nutrition: Goal: Adequate nutrition will be maintained Outcome: Progressing   Problem: Coping: Goal: Level of anxiety will decrease Outcome: Progressing   Problem: Pain Management: Goal: General experience of comfort will improve Outcome: Progressing   Problem: Safety: Goal: Ability to remain free from injury will improve Outcome: Progressing   Problem: Skin Integrity: Goal: Risk for impaired skin integrity will decrease Outcome: Progressing

## 2023-10-18 ENCOUNTER — Other Ambulatory Visit (HOSPITAL_COMMUNITY): Payer: Self-pay

## 2023-10-18 ENCOUNTER — Other Ambulatory Visit: Payer: Self-pay

## 2023-10-18 DIAGNOSIS — J189 Pneumonia, unspecified organism: Secondary | ICD-10-CM | POA: Diagnosis not present

## 2023-10-18 MED ORDER — ALBUTEROL SULFATE HFA 108 (90 BASE) MCG/ACT IN AERS
1.0000 | INHALATION_SPRAY | RESPIRATORY_TRACT | 3 refills | Status: AC | PRN
  Filled 2023-10-18: qty 18, 30d supply, fill #0
  Filled 2023-11-04: qty 18, 17d supply, fill #0
  Filled 2023-11-12: qty 18, 30d supply, fill #0
  Filled 2024-04-24: qty 18, 17d supply, fill #1

## 2023-10-18 MED ORDER — PREDNISONE 10 MG PO TABS
ORAL_TABLET | ORAL | 0 refills | Status: DC
  Filled 2023-10-18: qty 18, 9d supply, fill #0

## 2023-10-18 NOTE — Progress Notes (Signed)
 Unit CN has already reviewed pt instructions, pt's mother in route, spoke with her on the phone she is 5*-10 minutes out from hospital. Mother will call pt when she arrives to the main entrance and staff/hospital volunteer will take pt out.   Emmagrace Runkel,RN SWOT

## 2023-10-18 NOTE — Progress Notes (Signed)
 RN picked up medications from [facility] pharmacy and delivered them to patient at bedside.

## 2023-10-18 NOTE — Plan of Care (Signed)
  Problem: Education: Goal: Knowledge of General Education information will improve Description: Including pain rating scale, medication(s)/side effects and non-pharmacologic comfort measures Outcome: Progressing   Problem: Activity: Goal: Risk for activity intolerance will decrease Outcome: Progressing   Problem: Nutrition: Goal: Adequate nutrition will be maintained Outcome: Progressing   Problem: Coping: Goal: Level of anxiety will decrease Outcome: Progressing   Problem: Elimination: Goal: Will not experience complications related to bowel motility Outcome: Progressing   Problem: Pain Management: Goal: General experience of comfort will improve Outcome: Progressing

## 2023-10-18 NOTE — Plan of Care (Signed)

## 2023-10-18 NOTE — Discharge Summary (Signed)
 Physician Discharge Summary   Patient: Gregory Bishop MRN: [id number] DOB: 04-May-1976  Admit date:     10/13/2023  Discharge date: 10/18/23  Discharge Physician: Lonni SHAUNNA Dalton   PCP: Thedora Garnette HERO, MD     Recommendations at discharge:  Follow up with PCP Dr. Thedora in 1 week for asthma flare Dr. Thedora: If appropriate, please obtain spirometry or PFTs after resolution of this illness     Discharge Diagnoses: Principal Problem:   Asthma exacerbation Other hospital problems   Left lower lobe pneumonia, likely viral   B12 deficiency   History of alcohol use disorder on naltrexone    Hospital Course: 48 year old M with history of asthma, AUD in remission on naltrexone  who presented with cough productive of yellow sputum, dyspnea, wheezing in setting of several family members with URI.  Chest x-ray in the ER showed left lower lobe infiltrate.  COVID/flu/RSV negative.  Started on steroids, antibiotics, admitted.   Asthma exacerbation Possible pneumonia, likely viral Patient was admitted on steroids, antibiotics, bronchodilators.  He was weaned to room air, able to ambulate without hypoxia.  Discharged with prednisone  taper, PCP follow-up, refills of albuterol .                   [place]  Controlled Substances Registry was reviewed for this patient prior to discharge.  Consultants: None Procedures performed: CXR   Disposition: Home Diet recommendation:  Regular diet  DISCHARGE MEDICATION: Allergies as of 10/18/2023   No Known Allergies      Medication List     STOP taking these medications    amoxicillin  500 MG capsule Commonly known as: AMOXIL    azithromycin  250 MG tablet Commonly known as: ZITHROMAX        TAKE these medications    acamprosate  333 MG tablet Commonly known as: CAMPRAL  Take 2 tablets (666 mg total) by mouth 3 (three) times daily with meals.   albuterol  108 (90 Base) MCG/ACT inhaler Commonly known as:  VENTOLIN  HFA Inhale 1-2 puffs into the lungs every 4 (four) hours as needed for wheezing or shortness of breath.   B-12 1000 MCG Tabs Take 1 tablet (1,000 mcg total) by mouth daily.   cetirizine  10 MG tablet Commonly known as: ZyrTEC  Allergy Take 1 tablet (10 mg total) by mouth daily.   gabapentin  100 MG capsule Commonly known as: Neurontin  Take 1 capsule (100 mg total) by mouth daily as needed (anxiety, alcohol craving). What changed: when to take this   mirtazapine  15 MG tablet Commonly known as: REMERON  Take 1 tablet (15 mg total) by mouth at bedtime.   naltrexone  50 MG tablet Commonly known as: DEPADE Take 1 tablet (50 mg total) by mouth at bedtime.   NON FORMULARY Take 1-2 tablets by mouth See admin instructions. Emergen-C Citrus-Ginger Gummies, Turmeric and Ginger, Immune Support gummies- Chew 1-2 gummies by mouth daily   OLANZapine  10 MG tablet Commonly known as: ZYPREXA  Take 10 mg by mouth at bedtime.   OVER THE COUNTER MEDICATION Take 1 capsule by mouth daily as needed (Allergies). Clear Lungs   predniSONE  10 MG tablet Commonly known as: DELTASONE  Take prednisone  30 mg (3 tabs) once daily for 3 days then take prednisone  20 mg (2 tabs) once daily for 3 days then take prednisone  10 mg (1 tab) once daily for three days then stop What changed:  medication strength how much to take how to take this when to take this additional instructions   sildenafil  50 MG tablet Commonly known as: Viagra   Take 1 tablet (50 mg total) by mouth daily as needed for erectile dysfunction.   thiamine  100 MG tablet Commonly known as: VITAMIN B1 Take 1 tablet (100 mg total) by mouth daily.   Vitamin D  (Ergocalciferol ) 1.25 MG (50000 UNIT) Caps capsule Commonly known as: DRISDOL  Take 1 capsule (50,000 Units total) by mouth every 7 (seven) days.        Follow-up Information     Thedora Garnette HERO, MD. Schedule an appointment as soon as possible for a visit in 1 week(s).    Specialty: Family Medicine Contact information: [address] [place] [address] [phone]                 Discharge Instructions     Discharge instructions   Complete by: As directed    **IMPORTANT DISCHARGE INSTRUCTIONS**   From Dr. Jonel: You were admitted for an asthma flare  You were treated with steroids, bronchodilators (medicines to open airways) and antibiotics  You finished the antibiotics here.  Finish the steroids with a 1 week taper: Take prednisone  30 mg (3 tabs) once daily for 3 days (Tues, Weds, Thu) then  Take prednisone  20 mg (2 tabs) once daily for 3 days (Fri-Sun) then  Take prednisone  10 mg (1 tab) once daily for three days then stop   Use the albuterol  (bronchodilator) three times daily for the next week, then reduce to as needed use  Go see Dr. Thedora in 1 week   Increase activity slowly   Complete by: As directed        Discharge Exam: Filed Weights   10/14/23 1618  Weight: 62.9 kg    General: Pt is alert, awake, not in acute distress Cardiovascular: RRR, nl S1-S2, no murmurs appreciated.   No LE edema.   Respiratory: Normal respiratory rate and rhythm. Wheezing bilaterally. Abdominal: Abdomen soft and non-tender.  No distension or HSM.   Neuro/Psych: Strength symmetric in upper and lower extremities.  Judgment and insight appear normal .   Condition at discharge: good  The results of significant diagnostics from this hospitalization (including imaging, microbiology, ancillary and laboratory) are listed below for reference.   Imaging Studies: DG Chest 2 View Result Date: 10/13/2023 CLINICAL DATA:  Shortness of breath . EXAM: CHEST - 2 VIEW COMPARISON:  01/19/2022 FINDINGS: Lungs are hyperexpanded. Right lung clear. Subtle focal airspace disease noted parahilar left lung. The cardiopericardial silhouette is within normal limits for size. No acute bony abnormality. Telemetry leads overlie the chest. IMPRESSION:  Subtle focal airspace disease parahilar left lung. Imaging features compatible with pneumonia. Follow-up recommended to ensure resolution. Electronically Signed   By: Camellia Candle M.D.   On: 10/13/2023 07:25    Microbiology: Results for orders placed or performed during the hospital encounter of 10/13/23  Culture, blood (Routine X 2) w Reflex to ID Panel     Status: None (Preliminary result)   Collection Time: 10/14/23  4:43 PM   Specimen: BLOOD  Result Value Ref Range Status   Specimen Description BLOOD SITE NOT SPECIFIED  Final   Special Requests   Final    BOTTLES DRAWN AEROBIC AND ANAEROBIC Blood Culture results may not be optimal due to an inadequate volume of blood received in culture bottles   Culture   Final    NO GROWTH 4 DAYS Performed at [facility], [address], [place], [place] [address]    Report Status PENDING  Incomplete  Culture, blood (Routine X 2) w Reflex  to ID Panel     Status: None (Preliminary result)   Collection Time: 10/14/23  4:43 PM   Specimen: BLOOD  Result Value Ref Range Status   Specimen Description BLOOD SITE NOT SPECIFIED  Final   Special Requests   Final    BOTTLES DRAWN AEROBIC AND ANAEROBIC Blood Culture results may not be optimal due to an inadequate volume of blood received in culture bottles   Culture   Final    NO GROWTH 4 DAYS Performed at [facility], [address], [place], [place] [address]    Report Status PENDING  Incomplete    Labs: CBC: Recent Labs  Lab 10/14/23 0943 10/14/23 1416 10/15/23 0756 10/16/23 0702  WBC 17.2* 13.3* 14.1* 13.6*  HGB 16.3 15.9 15.3 14.5  HCT 47.9 46.6 45.2 42.8  MCV 93.6 92.5 91.9 91.5  PLT 233 204 192 274   Basic Metabolic Panel: Recent Labs  Lab 10/14/23 0943 10/14/23 1416 10/15/23 0756 10/16/23 0702  NA 139  --  139 138  K 4.4  --  3.6 3.6  CL 102  --  104 103  CO2 27  --  27 25  GLUCOSE 118*  --  109* 123*  BUN 7  --  14 16  CREATININE 0.89 1.05 1.00 0.87   CALCIUM  9.4  --  9.6 9.4   Liver Function Tests: No results for input(s): AST, ALT, ALKPHOS, BILITOT, PROT, ALBUMIN in the last 168 hours. CBG: No results for input(s): GLUCAP in the last 168 hours.  Discharge time spent: approximately 45 minutes spent on discharge counseling, evaluation of patient on day of discharge, and coordination of discharge planning with nursing, social work, pharmacy and case management  Signed: Lonni SHAUNNA Dalton, MD [facility] 10/18/2023

## 2023-10-19 ENCOUNTER — Telehealth: Payer: Self-pay

## 2023-10-19 LAB — CULTURE, BLOOD (ROUTINE X 2)
Culture: NO GROWTH
Culture: NO GROWTH

## 2023-10-19 NOTE — Transitions of Care (Post Inpatient/ED Visit) (Signed)
   10/19/2023  Name: Kaspian Muccio MRN: [id number] DOB: 1975-10-17  Today's TOC FU Call Status: Today's TOC FU Call Status:: Unsuccessful Call (1st Attempt) Unsuccessful Call (1st Attempt) Date: 10/19/23  Attempted to reach the patient regarding the most recent Inpatient/ED visit.  Follow Up Plan: Additional outreach attempts will be made to reach the patient to complete the Transitions of Care (Post Inpatient/ED visit) call.   Signature Julian Lemmings, LPN [facility] Nurse Health Advisor Direct Dial [phone]

## 2023-10-21 ENCOUNTER — Telehealth (INDEPENDENT_AMBULATORY_CARE_PROVIDER_SITE_OTHER): Payer: MEDICAID | Admitting: Student

## 2023-10-21 DIAGNOSIS — F17200 Nicotine dependence, unspecified, uncomplicated: Secondary | ICD-10-CM

## 2023-10-21 DIAGNOSIS — Z72821 Inadequate sleep hygiene: Secondary | ICD-10-CM

## 2023-10-21 DIAGNOSIS — F4310 Post-traumatic stress disorder, unspecified: Secondary | ICD-10-CM

## 2023-10-21 DIAGNOSIS — F331 Major depressive disorder, recurrent, moderate: Secondary | ICD-10-CM

## 2023-10-21 DIAGNOSIS — F1021 Alcohol dependence, in remission: Secondary | ICD-10-CM
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
## 2023-10-22 ENCOUNTER — Encounter (HOSPITAL_COMMUNITY): Payer: Self-pay | Admitting: Student

## 2023-10-22 ENCOUNTER — Ambulatory Visit (HOSPITAL_COMMUNITY): Payer: MEDICAID | Admitting: Licensed Clinical Social Worker

## 2023-10-22 DIAGNOSIS — Z72821 Inadequate sleep hygiene: Secondary | ICD-10-CM | POA: Insufficient documentation

## 2023-10-22 NOTE — Transitions of Care (Post Inpatient/ED Visit) (Signed)
   10/22/2023  Name: Noland Pizano MRN: [id number] DOB: [birth year]  Today's TOC FU Call Status: Today's TOC FU Call Status:: Unsuccessful Call (2nd Attempt) Unsuccessful Call (1st Attempt) Date: 10/19/23 Unsuccessful Call (2nd Attempt) Date: 10/22/23  Attempted to reach the patient regarding the most recent Inpatient/ED visit.  Follow Up Plan: Additional outreach attempts will be made to reach the patient to complete the Transitions of Care (Post Inpatient/ED visit) call.   Signature Julian Lemmings, LPN [facility] Nurse Health Advisor Direct Dial [phone]

## 2023-10-24 ENCOUNTER — Other Ambulatory Visit (HOSPITAL_COMMUNITY): Payer: Self-pay | Admitting: Student

## 2023-10-24 DIAGNOSIS — F1021 Alcohol dependence, in remission: Secondary | ICD-10-CM

## 2023-10-25 NOTE — Transitions of Care (Post Inpatient/ED Visit) (Signed)
   10/25/2023  Name: Gregory Bishop MRN: [id number] DOB: 1976-09-20  Today's TOC FU Call Status: Today's TOC FU Call Status:: Unsuccessful Call (3rd Attempt) Unsuccessful Call (1st Attempt) Date: 10/19/23 Unsuccessful Call (2nd Attempt) Date: 10/22/23 Unsuccessful Call (3rd Attempt) Date: 10/25/23  Attempted to reach the patient regarding the most recent Inpatient/ED visit.  Follow Up Plan: No further outreach attempts will be made at this time. We have been unable to contact the patient.  Signature Julian Lemmings, LPN [facility] Nurse Health Advisor Direct Dial [phone]

## 2023-10-26 ENCOUNTER — Other Ambulatory Visit: Payer: Self-pay

## 2023-10-26 MED ORDER — ACAMPROSATE CALCIUM 333 MG PO TBEC
666.0000 mg | DELAYED_RELEASE_TABLET | Freq: Three times a day (TID) | ORAL | 0 refills | Status: DC
  Filled 2023-10-28 – 2023-11-12 (×5): qty 360, 60d supply, fill #0
  Filled ????-??-??: fill #0

## 2023-10-26 MED ORDER — VITAMIN B-12 1000 MCG PO TABS
1000.0000 ug | ORAL_TABLET | Freq: Every day | ORAL | 0 refills | Status: DC
  Filled 2023-11-02 – 2023-12-19 (×3): qty 60, 60d supply, fill #0
  Filled ????-??-??: fill #0

## 2023-10-26 MED ORDER — NALTREXONE HCL 50 MG PO TABS
50.0000 mg | ORAL_TABLET | Freq: Every day | ORAL | 0 refills | Status: AC
  Filled 2023-11-02 – 2023-11-12 (×3): qty 60, 60d supply, fill #0
  Filled ????-??-??: fill #0

## 2023-10-26 NOTE — Telephone Encounter (Signed)
 Refills provided

## 2023-10-27 ENCOUNTER — Encounter: Payer: Self-pay | Admitting: Family Medicine

## 2023-10-27 ENCOUNTER — Other Ambulatory Visit: Payer: Self-pay

## 2023-10-27 ENCOUNTER — Ambulatory Visit: Payer: MEDICAID | Admitting: Family Medicine

## 2023-10-27 VITALS — BP 114/68 | HR 66 | Temp 97.2°F | Ht 71.0 in | Wt 145.8 lb

## 2023-10-27 DIAGNOSIS — K4090 Unilateral inguinal hernia, without obstruction or gangrene, not specified as recurrent: Secondary | ICD-10-CM

## 2023-10-27 DIAGNOSIS — F17201 Nicotine dependence, unspecified, in remission: Secondary | ICD-10-CM

## 2023-10-27 DIAGNOSIS — E559 Vitamin D deficiency, unspecified: Secondary | ICD-10-CM

## 2023-10-27 DIAGNOSIS — J4530 Mild persistent asthma, uncomplicated: Secondary | ICD-10-CM | POA: Diagnosis not present

## 2023-10-27 LAB — VITAMIN D 25 HYDROXY (VIT D DEFICIENCY, FRACTURES): VITD: 23.19 ng/mL — ABNORMAL LOW (ref 30.00–100.00)

## 2023-10-27 MED ORDER — FLUTICASONE-SALMETEROL 100-50 MCG/ACT IN AEPB
1.0000 | INHALATION_SPRAY | Freq: Two times a day (BID) | RESPIRATORY_TRACT | 3 refills | Status: DC
  Filled 2023-10-27 – 2023-11-12 (×3): qty 60, 30d supply, fill #0
  Filled 2023-12-19: qty 60, 30d supply, fill #1
  Filled 2024-04-19: qty 60, 30d supply, fill #2
  Filled 2024-06-30: qty 60, 30d supply, fill #3

## 2023-10-27 NOTE — Assessment & Plan Note (Signed)
 Exam is consistent with an inguinal hernia. I will refer him to surgery to consider hernia repair. I reviewed warning signs of an incarcerated hernia, which should prompt more immediate evaluation.

## 2023-10-27 NOTE — Progress Notes (Signed)
 [facility] [address] [place] [address] Dept: [phone] Dept Fax: [phone]  Hospital Follow-up Visit  Subjective:    Patient ID: Gregory Bishop, male    DOB: 12-23-1975, 48 y.o..   MRN: [id number]  Chief Complaint  Patient presents with   Hospitalization Follow-up    Hospital f/u from 10/13/23 Pneumonia.  C/o hernia lower abdomen.   Needs referral to pulmonologist.    History of Present Illness:  Patient is in today for follow-up from a recent hospitalization. Gregory Bishop was admitted at [facility] from 1/1-10/18/2023 with an acute asthma exacerbation and viral pneumonia. He notes that since returning home, he is breathing much better. He has completed his steroid course. He has stopped smoking for the past 13 days and feels like he is managing off of cigarettes fairly well.  Gregory Bishop also notes that he has a hernia that has developed in his left lower abdomen. This is not painful, but has been increasing in size.  Past Medical History: Patient Active Problem List   Diagnosis Date Noted   Mild persistent asthma 10/27/2023   Inguinal hernia 10/27/2023   Inadequate sleep hygiene 10/22/2023   Vitamin D  deficiency 09/20/2023   Erectile dysfunction 03/26/2023   Memory impairment 02/05/2023   Nightmares 01/30/2022   Wheezing 12/23/2021   Tobacco use disorder, mild, in early remission 12/23/2021   Insomnia 12/05/2021   Alcohol use disorder, severe, in sustained remission (HCC) 07/24/2021   PTSD (post-traumatic stress disorder) 07/21/2021   MDD (major depressive disorder), recurrent episode, moderate (HCC) 07/21/2021   Dizziness 05/22/2021   Past Surgical History:  Procedure Laterality Date   LEG SURGERY Left 2014   Family History  Problem Relation Age of Onset   Diabetes Maternal Uncle    Diabetes Maternal Grandfather    Outpatient Medications Prior to Visit  Medication Sig Dispense Refill   acamprosate  (CAMPRAL )  333 MG tablet Take 2 tablets (666 mg total) by mouth 3 (three) times daily with meals. 360 tablet 0   albuterol  (VENTOLIN  HFA) 108 (90 Base) MCG/ACT inhaler Inhale 1-2 puffs into the lungs every 4 (four) hours as needed for wheezing or shortness of breath. 18 g 3   cetirizine  (ZYRTEC  ALLERGY) 10 MG tablet Take 1 tablet (10 mg total) by mouth daily. 28 tablet 0   cyanocobalamin  (VITAMIN B12) 1000 MCG tablet Take 1 tablet (1,000 mcg total) by mouth daily. 60 tablet 0   gabapentin  (NEURONTIN ) 100 MG capsule Take 1 capsule (100 mg total) by mouth daily as needed (anxiety, alcohol craving). (Patient taking differently: Take 100 mg by mouth daily.) 30 capsule 0   mirtazapine  (REMERON ) 15 MG tablet Take 1 tablet (15 mg total) by mouth at bedtime. 60 tablet 0   naltrexone  (DEPADE) 50 MG tablet Take 1 tablet (50 mg total) by mouth at bedtime. 60 tablet 0   NON FORMULARY Take 1-2 tablets by mouth See admin instructions. Emergen-C Citrus-Ginger Gummies, Turmeric and Ginger, Immune Support gummies- Chew 1-2 gummies by mouth daily     OLANZapine  (ZYPREXA ) 10 MG tablet Take 10 mg by mouth at bedtime.     OVER THE COUNTER MEDICATION Take 1 capsule by mouth daily as needed (Allergies). Clear Lungs     sildenafil  (VIAGRA ) 50 MG tablet Take 1 tablet (50 mg total) by mouth daily as needed for erectile dysfunction. 10 tablet 11   thiamine  (VITAMIN B1) 100 MG tablet Take 1 tablet (100 mg total) by mouth daily. 60 tablet 0  Vitamin D , Ergocalciferol , (DRISDOL ) 1.25 MG (50000 UNIT) CAPS capsule Take 1 capsule (50,000 Units total) by mouth every 7 (seven) days. 12 capsule 0   predniSONE  (DELTASONE ) 10 MG tablet Take prednisone  30 mg (3 tabs) once daily for 3 days then take prednisone  20 mg (2 tabs) once daily for 3 days then take prednisone  10 mg (1 tab) once daily for three days then stop 18 tablet 0   No facility-administered medications prior to visit.   Allergies  Allergen Reactions   Other      Objective:    Today's Vitals   10/27/23 1354  BP: 114/68  Pulse: 66  Temp: (!) 97.2 F (36.2 C)  TempSrc: Temporal  SpO2: 97%  Weight: 145 lb 12.8 oz (66.1 kg)  Height: 5\' 11"  (1.803 m)   Body mass index is 20.33 kg/m.   General: Well developed, well nourished. No acute distress. Lungs: Mild wheezing int he bases with initial respirations. No rales or rhonchi. Abdomen: There is a 5-6 cm protruding bulge in the left lower abdomen/inguinal area c/w a hernia. This is   easily reducible. Nontender. Psych: Alert and oriented. Normal mood and affect.  Health Maintenance Due  Topic Date Due   Pneumococcal Vaccine 25-31 Years old (1 of 2 - PCV) Never done   Colonoscopy  Never done     Assessment & Plan:   Problem List Items Addressed This Visit       Respiratory   Mild persistent asthma - Primary   Improved, but some wheezing still present. I will start him on an Advair  diskus as a daily controller. He can continue to use albuterol  as needed. I recommend reassessment in 1 month.      Relevant Medications   fluticasone -salmeterol (ADVAIR ) 100-50 MCG/ACT AEPB     Other   Inguinal hernia   Exam is consistent with an inguinal hernia. I will refer him to surgery to consider hernia repair. I reviewed warning signs of an incarcerated hernia, which should prompt more immediate evaluation.      Relevant Orders   Ambulatory referral to General Surgery   Tobacco use disorder, mild, in early remission   Congratulated Gregory Bishop on his smoking cessation efforts. I encouraged him to continue to abstain from smoking.      Vitamin D  deficiency   I will reassess his Vitamin D  level.      Relevant Orders   VITAMIN D  25 Hydroxy (Vit-D Deficiency, Fractures)    Return in about 4 weeks (around 11/24/2023) for Reassessment.   [name], MD

## 2023-10-27 NOTE — Assessment & Plan Note (Signed)
 I will reassess his Vitamin D level.

## 2023-10-27 NOTE — Assessment & Plan Note (Signed)
 Congratulated Gregory Bishop on his smoking cessation efforts. I encouraged him to continue to abstain from smoking.

## 2023-10-27 NOTE — Assessment & Plan Note (Signed)
 Improved, but some wheezing still present. I will start him on an Advair  diskus as a daily controller. He can continue to use albuterol  as needed. I recommend reassessment in 1 month.

## 2023-10-28 ENCOUNTER — Other Ambulatory Visit: Payer: Self-pay

## 2023-11-01 ENCOUNTER — Encounter (HOSPITAL_COMMUNITY): Payer: Self-pay

## 2023-11-01 ENCOUNTER — Ambulatory Visit (HOSPITAL_COMMUNITY): Payer: Medicare (Managed Care) | Admitting: Licensed Clinical Social Worker

## 2023-11-02 ENCOUNTER — Other Ambulatory Visit: Payer: Self-pay

## 2023-11-04 ENCOUNTER — Other Ambulatory Visit: Payer: Self-pay

## 2023-11-04 ENCOUNTER — Other Ambulatory Visit (HOSPITAL_COMMUNITY): Payer: Self-pay

## 2023-11-04 ENCOUNTER — Other Ambulatory Visit: Payer: Self-pay | Admitting: Family Medicine

## 2023-11-04 DIAGNOSIS — E559 Vitamin D deficiency, unspecified: Secondary | ICD-10-CM

## 2023-11-04 MED ORDER — OLANZAPINE 10 MG PO TABS
10.0000 mg | ORAL_TABLET | Freq: Every day | ORAL | 0 refills | Status: DC
  Filled 2023-11-04 – 2023-11-06 (×2): qty 30, 30d supply, fill #0

## 2023-11-04 NOTE — Telephone Encounter (Signed)
Refill request for Olanzapine 10 mg LR HX provider  LOV  10/27/23 FOV 11/30/23  Please review and advise.  Thanks. Dm/cma

## 2023-11-05 ENCOUNTER — Ambulatory Visit: Payer: Self-pay | Admitting: Surgery

## 2023-11-05 NOTE — H&P (Signed)
Gregory Bishop [id number]   Referring Provider:  Herbie Drape, MD   Subjective   Chief Complaint: New Consultation (hernia)     History of Present Illness:    Very pleasant 48yo male with history of stroke, history of alcohol use disorder on naltrexone, tobacco abuse (quit 3 weeks ago), asthma with recent hospitalization 10/13/23-10/18/23 for the same along with viral LLL pna. Treatment included steroids, abx, bronchodilators. Subsequently followed up with PCP on 1/15 and while he was doing better from a respiratory standpoint and had quit smoking for the prior 2 weeks, had noted an increasingly large left inguinal hernia.  He and his significant other states this is present for about 2-1/2 years, but has gradually increased in size.  He denies any pain, no associated GI or urinary symptoms.  No previous abdominal surgery.  Currently does not have to do any heavy lifting.  Labs done earlier this month during ER workup for shortness of breath/recent admission included unremarkable BMP, low vit D, CBC with mild leukocytosis 13.6. Xray 10/13/23 with pneumonia.  Review of Systems: A complete review of systems was obtained from the patient.  I have reviewed this information and discussed as appropriate with the patient.  See HPI as well for other ROS.   Medical History: Past Medical History:  Diagnosis Date   Anxiety    Asthma, unspecified asthma severity, unspecified whether complicated, unspecified whether persistent (HHS-HCC)    Substance abuse (CMS/HHS-HCC)     There is no problem list on file for this patient.   Past Surgical History:  Procedure Laterality Date   leg surgery       No Known Allergies  Current Outpatient Medications on File Prior to Visit  Medication Sig Dispense Refill   acamprosate (CAMPRAL) 333 mg tablet Take 666 mg by mouth     albuterol MDI, PROVENTIL, VENTOLIN, PROAIR, HFA 90 mcg/actuation inhaler Inhale 1-2 inhalations into the lungs     cyanocobalamin  (VITAMIN B12) 1000 MCG tablet Take 1,000 mcg by mouth once daily     ergocalciferol, vitamin D2, 1,250 mcg (50,000 unit) capsule Take 50,000 Units by mouth every 7 (seven) days     fluticasone propion-salmeteroL (ADVAIR DISKUS) 100-50 mcg/dose diskus inhaler Inhale 1 Puff into the lungs 2 (two) times daily     naltrexone (REVIA) 50 mg tablet Take 50 mg by mouth at bedtime     OLANZapine (ZYPREXA) 10 MG tablet Take 10 mg by mouth at bedtime     thiamine (VITAMIN B-1) 100 MG tablet Take 100 mg by mouth once daily     No current facility-administered medications on file prior to visit.    Family History  Problem Relation Age of Onset   Obesity Mother    Obesity Brother      Social History   Tobacco Use  Smoking Status Former   Types: Cigarettes  Smokeless Tobacco Never     Social History   Socioeconomic History   Marital status: Single  Tobacco Use   Smoking status: Former    Types: Cigarettes   Smokeless tobacco: Never  Vaping Use   Vaping status: Never Used  Substance and Sexual Activity   Alcohol use: Not Currently   Drug use: Never   Social Drivers of Health   Financial Resource Strain: High Risk (09/16/2023)   Received from [facility]   Overall Financial Resource Strain (CARDIA)    Difficulty of Paying Living Expenses: Very hard  Food Insecurity: No Food Insecurity (10/14/2023)   Received  [facility]   Hunger Vital Sign    Worried About Running Out of Food in the Last Year: Never true    Ran Out of Food in the Last Year: Never true  Transportation Needs: No Transportation Needs (10/14/2023)   Received from [facility] - Transportation    Lack of Transportation (Medical): No    Lack of Transportation (Non-Medical): No  Physical Activity: Unknown (09/16/2023)   Received from [facility]   Exercise Vital Sign    Days of Exercise per Week: Patient declined  Stress: Stress Concern Present (09/16/2023)   Received from [facility]  [facility] - Occupational Stress Questionnaire    Feeling of Stress : Very much  Social Connections: Moderately Isolated (10/14/2023)   Received from [facility]   Social Connection and Isolation Panel [NHANES]    Frequency of Communication with Friends and Family: Three times a week    Frequency of Social Gatherings with Friends and Family: Never    Attends Religious Services: Never    Database administrator or Organizations: No    Attends Banker Meetings: Never    Marital Status: Living with partner  Housing Stability: Low Risk  (10/14/2023)   Received from [facility]   Housing Stability Vital Sign    Unable to Pay for Housing in the Last Year: No    Number of Times Moved in the Last Year: 0    Homeless in the Last Year: No    Objective:    Vitals:   11/05/23 1540  BP: 116/72  Pulse: 94  Temp: 36.7 C (98 F)  SpO2: 96%  Weight: 66 kg (145 lb 9.6 oz)  Height: 180.3 cm (5\' 11" )  PainSc: 0-No pain    Body mass index is 20.31 kg/m.  Gen: A&Ox3, no distress  Unlabored respirations Abd s/nt/nd.  He has bilateral reducible inguinal hernias, left greater than right.  No tenderness or overlying skin change.  Assessment and Plan:  Diagnoses and all orders for this visit:  Non-recurrent bilateral inguinal hernia without obstruction or gangrene    We discussed the relevant anatomy and we discussed options for repair.  I recommend a robotic approach to repair both sides and went over the technique of the procedure.  Discussed risks of bleeding, infection, pain, scarring, injury to structures in the area including nerves, blood vessels, bowel, bladder, risk of chronic pain, hernia recurrence, risk of seroma or hematoma, urinary retention, and risks of general anesthesia including cardiovascular, pulmonary, and thromboembolic complications.  We discussed typical postop recovery, timeline, and activity limitations.  We also discussed the option of ongoing  observation, with high rate of ultimately returning for surgery and risk of increasing size/symptoms from the hernia as well as incarceration/strangulation and went over symptoms that should prompt him to seek emergency treatment.  Questions were welcomed and answered to the patient's satisfaction. Patient wishes to proceed with scheduling.   Emmalou Hunger Carlye Grippe, MD

## 2023-11-05 NOTE — Telephone Encounter (Signed)
Patient notified VIA phone. Dm/cma

## 2023-11-06 ENCOUNTER — Other Ambulatory Visit (HOSPITAL_COMMUNITY): Payer: Self-pay

## 2023-11-08 ENCOUNTER — Other Ambulatory Visit: Payer: Self-pay

## 2023-11-08 ENCOUNTER — Other Ambulatory Visit (HOSPITAL_COMMUNITY): Payer: Self-pay

## 2023-11-12 ENCOUNTER — Other Ambulatory Visit: Payer: Self-pay

## 2023-11-12 ENCOUNTER — Other Ambulatory Visit: Payer: Self-pay | Admitting: Family Medicine

## 2023-11-12 ENCOUNTER — Other Ambulatory Visit (HOSPITAL_COMMUNITY): Payer: Self-pay | Admitting: Student

## 2023-11-12 DIAGNOSIS — E559 Vitamin D deficiency, unspecified: Secondary | ICD-10-CM

## 2023-11-12 DIAGNOSIS — F1021 Alcohol dependence, in remission: Secondary | ICD-10-CM

## 2023-11-13 NOTE — Telephone Encounter (Signed)
Duplicate order of b12

## 2023-11-18 ENCOUNTER — Ambulatory Visit (HOSPITAL_COMMUNITY): Payer: MEDICAID | Admitting: Licensed Clinical Social Worker

## 2023-11-18 DIAGNOSIS — F4310 Post-traumatic stress disorder, unspecified: Secondary | ICD-10-CM

## 2023-11-18 DIAGNOSIS — F331 Major depressive disorder, recurrent, moderate: Secondary | ICD-10-CM

## 2023-11-18 NOTE — Progress Notes (Signed)
 THERAPIST PROGRESS NOTE  Virtual Visit via Video Note  I connected with Gregory Bishop on 11/18/23 at  2:00 PM EST by a video enabled telemedicine application and verified that I am speaking with the correct person using two identifiers.  Location: Patient: [facility]  Provider: Provider Home    I discussed the limitations of evaluation and management by telemedicine and the availability of in person appointments. The patient expressed understanding and agreed to proceed.     I discussed the assessment and treatment plan with the patient. The patient was provided an opportunity to ask questions and all were answered. The patient agreed with the plan and demonstrated an understanding of the instructions.   The patient was advised to call back or seek an in-person evaluation if the symptoms worsen or if the condition fails to improve as anticipated.  I provided 30 minutes of non-face-to-face time during this encounter.   Juliene GORMAN Patee, LCSW   Participation Level: Active  Behavioral Response: CasualAlertAnxious and Depressed  Type of Therapy: Individual Therapy  Treatment Goals addressed:  Active     PTSD-Trauma Disorder [facility] Problem  1 PTSD       Decrease PHQ-9 down 10  (Progressing)     Start:  08/18/22    Expected End:  12/10/23         Decrease GAD-7 down by 10  (Progressing)     Start:  08/18/22    Expected End:  12/10/23         Gain income either through SSDI or work.  (Progressing)     Start:  08/18/22    Expected End:  12/10/23         ENCOURAGE Gregory Bishop TO PRACTICE BREATHING RETRAINING FOR 10 MINUTES, 3 TIMES PER DAY (Completed)     Start:  08/18/22    End:  01/21/23      WORK WITH Gregory Bishop TO COMPLETE THE PHQ-9 DURING [facility] INDIVIDUAL SESSION (Completed)     Start:  08/18/22    End:  10/01/22      EDUCATE Gregory Bishop ON COMMON REACTIONS TO A TRAUMATIC EXPERIENCE (Completed)     Start:  08/18/22    End:  11/18/22      WORK WITH Gregory Bishop TO  CONSTRUCT AN "IN VIVO EXPOSURE HIERARCHY" A LIST OF THE SITUATIONS, PEOPLE, & PLACES THAT Gregory Bishop AVOIDS BECAUSE OF THEIR TRAUMA (Completed)     Start:  08/18/22    End:  11/18/22      ENCOURAGE Gregory Bishop TO PRACTICE ON IN VIVO EXPOSURE ASSIGNMENT BETWEEN INDIVIDUAL SESSIONS AND TRACK THEIR RESPONSE USING PRE-, POST-, AND PEAK- SUDS SCORE (Completed)     Start:  08/18/22    End:  11/18/22         ProgressTowards Goals: Progressing  Interventions: CBT, Motivational Interviewing, and Supportive  Suicidal/Homicidal: Nowithout intent/plan  Therapist Response:     Gregory Bishop was alert and oriented x 5.  He was pleasant, cooperative, maintained good eye contact.  He engaged well in therapy session was dressed casually.  He presented today with anxious mood\affect.  Patient reports primary stressor is illness.  This is as evidenced by hospitalization 1 month ago.  Patient reports that he was in the hospital for 5 days with pneumonia.  Patient reports that he got ill from a child that his roommate was looking after.  Patient reports after the 5 days spent in the hospital he spent the next several weeks recovering.  Patient also reports that the doctor had suggested that  he get a hernia repair which will be completed at the end of the month.  Patient reports tension and worry as he has never been treated for a surgical procedure in the past.   Intervention/plan: LCSW educated patient on taking medications as prescribed and consistently for best outcomes.  LCSW utilized supportive therapy for praise and encouragement.  LCSW utilized psycho analytic therapy for patient to express thoughts, feelings and emotions and nonjudgmental environment.  LCSW used open-ended questions and reflective listening for motivational interviewing.  Plan: Return again in 3 weeks.  Diagnosis: PTSD (post-traumatic stress disorder)  MDD (major depressive disorder), recurrent episode, moderate (HCC)  Collaboration of Care:  Other None today   Patient/Guardian was advised Release of Information must be obtained prior to any record release in order to collaborate their care with an outside provider. Patient/Guardian was advised if they have not already done so to contact the registration department to sign all necessary forms in order for us  to release information regarding their care.   Consent: Patient/Guardian gives verbal consent for treatment and assignment of benefits for services provided during this visit. Patient/Guardian expressed understanding and agreed to proceed.   Juliene GORMAN Patee, LCSW 11/18/2023

## 2023-11-26 IMAGING — CR DG CHEST 2V
2 series · 2 of 2 positions shown · non-contrast
Comparison: 01/30/2021

CLINICAL DATA: Cough and shortness of breath.

EXAM:
CHEST - 2 VIEW

[w chest pa]
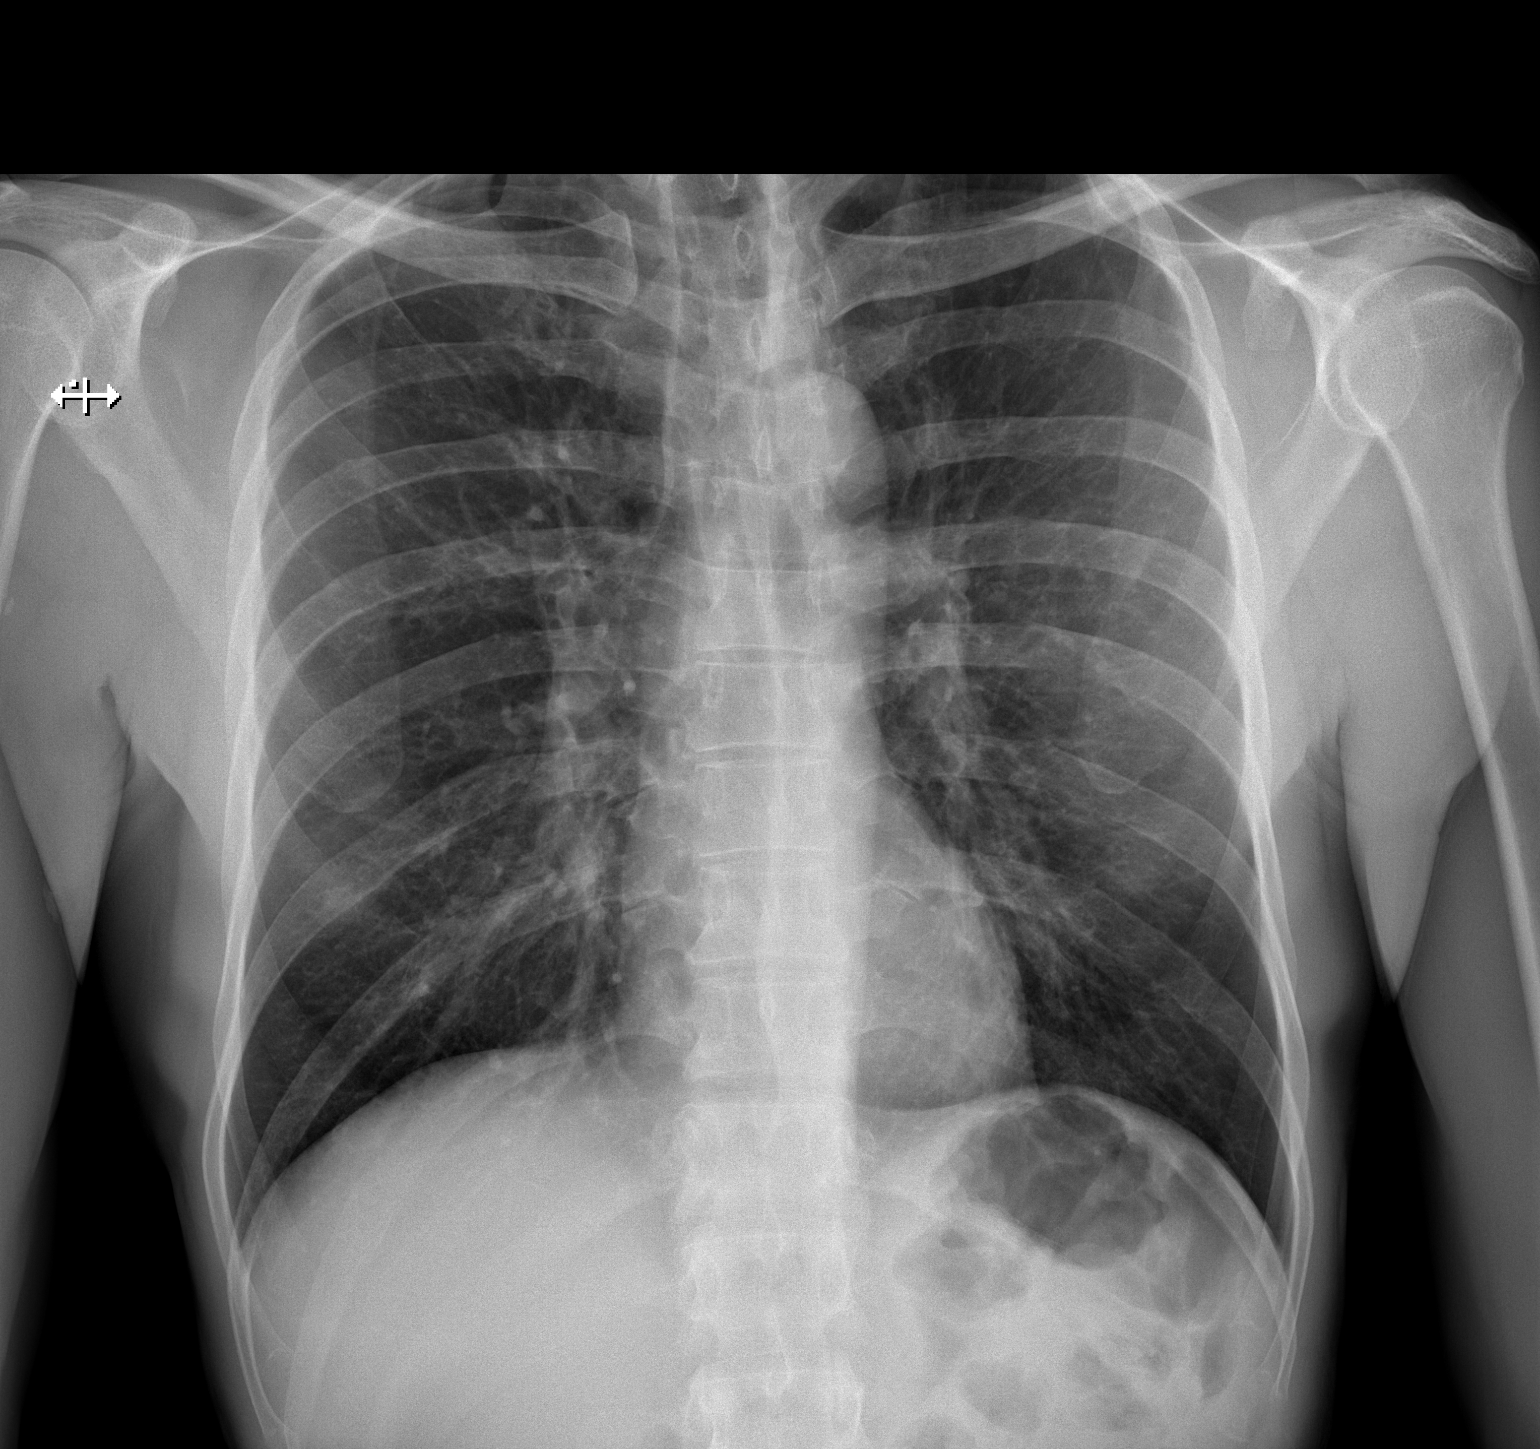

[w chest lat]
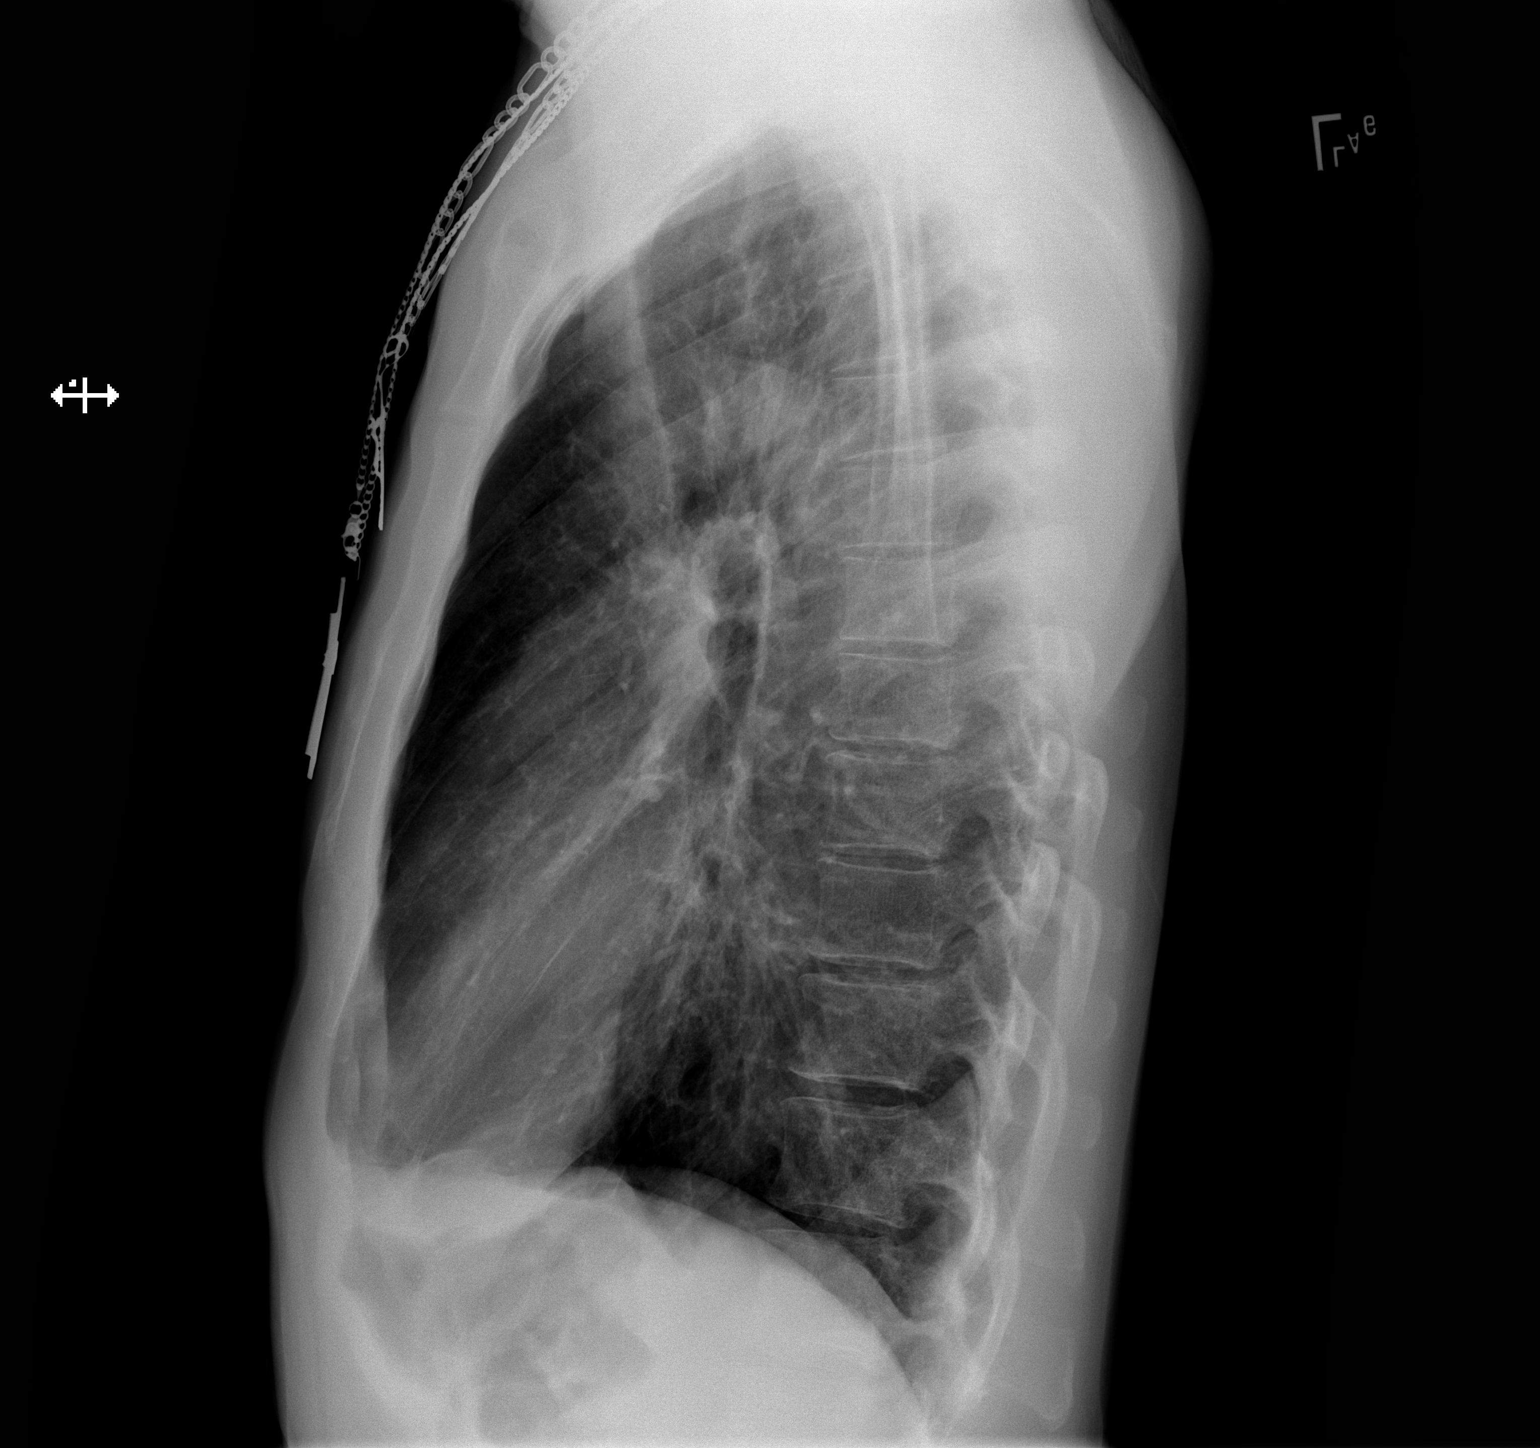

[2 of 2 positions shown; findings below may reference images not displayed]

FINDINGS: The cardiomediastinal contours are normal. Chronic hyperinflation
the lungs are clear. Pulmonary vasculature is normal. No
consolidation, pleural effusion, or pneumothorax. No acute osseous
abnormalities are seen.
IMPRESSION: Chronic hyperinflation without acute abnormality.

## 2023-11-30 ENCOUNTER — Other Ambulatory Visit: Payer: Self-pay

## 2023-11-30 ENCOUNTER — Ambulatory Visit: Payer: MEDICAID | Admitting: Family Medicine

## 2023-11-30 NOTE — Progress Notes (Signed)
Anesthesia Review:  PCP: Herbie Drape- LOV 10/27/23  Cardiologist :  [facility]- PTSD LOV 11/18/23  Chest x-ray : 10/13/23- Pneumonia  EKG : 10/15/23  Echo : Stress test: Cardiac Cath :  Activity level:  Sleep Study/ CPAP : Fasting Blood Sugar :      / Checks Blood Sugar -- times a day:   Blood Thinner/ Instructions /Last Dose: ASA / Instructions/ Last Dose :    In ED on 10/13/23- with Community acquired pneumonia  10/13/23- Negative for RSV, Covid, Flu A and B    PT would like to sign   consent DOS since the risks of surgery he states has not been explained to him.  Called [facility] and spoke with Triage and made them aware.     Significant other with pt at time of preop appt.

## 2023-12-01 NOTE — Patient Instructions (Signed)
SURGICAL WAITING ROOM VISITATION  Patients having surgery or a procedure may have no more than 2 support people in the waiting area - these visitors may rotate.    Children under the age of 74 must have an adult with them who is not the patient.  Due to an increase in RSV and influenza rates and associated hospitalizations, children ages 55 and under may not visit patients in [facility] hospitals.  Visitors with respiratory illnesses are discouraged from visiting and should remain at home.  If the patient needs to stay at the hospital during part of their recovery, the visitor guidelines for inpatient rooms apply. Pre-op nurse will coordinate an appropriate time for 1 support person to accompany patient in pre-op.  This support person may not rotate.    Please refer to the [facility] website for the visitor guidelines for Inpatients (after your surgery is over and you are in a regular room).       Your procedure is scheduled on:  12/10/2023    Report to [facility] Main Entrance    Report to admitting at  100 pm    Call this number if you have problems the morning of surgery [phone]   Do not eat food  or drink liquids :After Midnight.               If you have questions, please contact your surgeon's office.       Oral Hygiene is also important to reduce your risk of infection.                                    Remember - BRUSH YOUR TEETH THE MORNING OF SURGERY WITH YOUR REGULAR TOOTHPASTE  DENTURES WILL BE REMOVED PRIOR TO SURGERY PLEASE DO NOT APPLY "Poly grip" OR ADHESIVES!!!   Do NOT smoke after Midnight   Stop all vitamins and herbal supplements 7 days before surgery.   Take these medicines the morning of surgery with A SIP OF WATER:  inhalers as usual and bring, gabapentin   DO NOT TAKE ANY ORAL DIABETIC MEDICATIONS DAY OF YOUR SURGERY  Bring CPAP mask and tubing day of surgery.                              You may not have any metal on your  body including hair pins, jewelry, and body piercing             Do not wear make-up, lotions, powders, perfumes/cologne, or deodorant  Do not wear nail polish including gel and S&S, artificial/acrylic nails, or any other type of covering on natural nails including finger and toenails. If you have artificial nails, gel coating, etc. that needs to be removed by a nail salon please have this removed prior to surgery or surgery may need to be canceled/ delayed if the surgeon/ anesthesia feels like they are unable to be safely monitored.   Do not shave  48 hours prior to surgery.               Men may shave face and neck.   Do not bring valuables to the hospital. [place] IS NOT             RESPONSIBLE   FOR VALUABLES.   Contacts, glasses, dentures or bridgework may not be worn into surgery.   Bring  small overnight bag day of surgery.   DO NOT BRING YOUR HOME MEDICATIONS TO THE HOSPITAL. PHARMACY WILL DISPENSE MEDICATIONS LISTED ON YOUR MEDICATION LIST TO YOU DURING YOUR ADMISSION IN THE HOSPITAL!    Patients discharged on the day of surgery will not be allowed to drive home.  Someone NEEDS to stay with you for the first 24 hours after anesthesia.   Special Instructions: Bring a copy of your healthcare power of attorney and living will documents the day of surgery if you haven't scanned them before.              Please read over the following fact sheets you were given: IF YOU HAVE QUESTIONS ABOUT YOUR PRE-OP INSTRUCTIONS PLEASE CALL [phone]   If you received a COVID test during your pre-op visit  it is requested that you wear a mask when out in public, stay away from anyone that may not be feeling well and notify your surgeon if you develop symptoms. If you test positive for Covid or have been in contact with anyone that has tested positive in the last 10 days please notify you surgeon.    [facility] - Preparing for Surgery Before surgery, you can play an important role.  Because  skin is not sterile, your skin needs to be as free of germs as possible.  You can reduce the number of germs on your skin by washing with CHG (chlorahexidine gluconate) soap before surgery.  CHG is an antiseptic cleaner which kills germs and bonds with the skin to continue killing germs even after washing. Please DO NOT use if you have an allergy to CHG or antibacterial soaps.  If your skin becomes reddened/irritated stop using the CHG and inform your nurse when you arrive at Short Stay. Do not shave (including legs and underarms) for at least 48 hours prior to the first CHG shower.  You may shave your face/neck. Please follow these instructions carefully:  1.  Shower with CHG Soap the night before surgery and the  morning of Surgery.  2.  If you choose to wash your hair, wash your hair first as usual with your  normal  shampoo.  3.  After you shampoo, rinse your hair and body thoroughly to remove the  shampoo.                           4.  Use CHG as you would any other liquid soap.  You can apply chg directly  to the skin and wash                       Gently with a scrungie or clean washcloth.  5.  Apply the CHG Soap to your body ONLY FROM THE NECK DOWN.   Do not use on face/ open                           Wound or open sores. Avoid contact with eyes, ears mouth and genitals (private parts).                       Wash face,  Genitals (private parts) with your normal soap.             6.  Wash thoroughly, paying special attention to the area where your surgery  will be performed.  7.  Thoroughly rinse your body with  warm water from the neck down.  8.  DO NOT shower/wash with your normal soap after using and rinsing off  the CHG Soap.                9.  Pat yourself dry with a clean towel.            10.  Wear clean pajamas.            11.  Place clean sheets on your bed the night of your first shower and do not  sleep with pets. Day of Surgery : Do not apply any lotions/deodorants the morning of  surgery.  Please wear clean clothes to the hospital/surgery center.  FAILURE TO FOLLOW THESE INSTRUCTIONS MAY RESULT IN THE CANCELLATION OF YOUR SURGERY PATIENT SIGNATURE_________________________________  NURSE SIGNATURE__________________________________  ________________________________________________________________________

## 2023-12-03 ENCOUNTER — Other Ambulatory Visit: Payer: Self-pay

## 2023-12-03 ENCOUNTER — Encounter (HOSPITAL_COMMUNITY): Payer: Self-pay

## 2023-12-03 ENCOUNTER — Encounter (HOSPITAL_COMMUNITY)
Admission: RE | Admit: 2023-12-03 | Discharge: 2023-12-03 | Disposition: A | Discharge status: Home / Self Care | Payer: MEDICAID | Source: Ambulatory Visit | Attending: Surgery | Admitting: Surgery

## 2023-12-03 VITALS — BP 119/85 | HR 77 | Temp 98.5°F | Resp 16 | Ht 71.0 in | Wt 143.0 lb

## 2023-12-03 DIAGNOSIS — Z01812 Encounter for preprocedural laboratory examination: Secondary | ICD-10-CM | POA: Diagnosis present

## 2023-12-03 DIAGNOSIS — Z01818 Encounter for other preprocedural examination: Secondary | ICD-10-CM

## 2023-12-03 HISTORY — DX: Anxiety disorder, unspecified: F41.9

## 2023-12-03 HISTORY — DX: Unspecified asthma, uncomplicated: J45.909

## 2023-12-03 HISTORY — DX: Pneumonia, unspecified organism: J18.9

## 2023-12-03 HISTORY — DX: Depression, unspecified: F32.A

## 2023-12-03 LAB — BASIC METABOLIC PANEL
Anion gap: 8 (ref 5–15)
BUN: 13 mg/dL (ref 6–20)
CO2: 27 mmol/L (ref 22–32)
Calcium: 9.3 mg/dL (ref 8.9–10.3)
Chloride: 104 mmol/L (ref 98–111)
Creatinine, Ser: 1.18 mg/dL (ref 0.61–1.24)
GFR, Estimated: >60 mL/min (ref >60)
Glucose, Bld: 93 mg/dL (ref 70–99)
Potassium: 4.1 mmol/L (ref 3.5–5.1)
Sodium: 139 mmol/L (ref 135–145)

## 2023-12-03 LAB — CBC
HCT: 39.3 % (ref 39.0–52.0)
Hemoglobin: 13.4 g/dL (ref 13.0–17.0)
MCH: 30.4 pg (ref 26.0–34.0)
MCHC: 34.1 g/dL (ref 30.0–36.0)
MCV: 89.1 fL (ref 80.0–100.0)
Platelets: 256 10*3/uL (ref 150–400)
RBC: 4.41 MIL/uL (ref 4.22–5.81)
RDW: 11.7 % (ref 11.5–15.5)
WBC: 10.1 10*3/uL (ref 4.0–10.5)
nRBC: 0 % (ref 0.0–0.2)

## 2023-12-10 ENCOUNTER — Other Ambulatory Visit: Payer: Self-pay

## 2023-12-10 ENCOUNTER — Other Ambulatory Visit (HOSPITAL_COMMUNITY): Payer: Self-pay

## 2023-12-10 ENCOUNTER — Ambulatory Visit (HOSPITAL_COMMUNITY)
Admission: RE | Admit: 2023-12-10 | Discharge: 2023-12-10 | Disposition: A | Discharge status: Home / Self Care | Payer: MEDICAID | Attending: Surgery | Admitting: Surgery

## 2023-12-10 ENCOUNTER — Ambulatory Visit (HOSPITAL_COMMUNITY): Payer: MEDICAID | Admitting: Anesthesiology

## 2023-12-10 ENCOUNTER — Encounter (HOSPITAL_COMMUNITY): Payer: Self-pay | Admitting: Surgery

## 2023-12-10 ENCOUNTER — Ambulatory Visit (HOSPITAL_BASED_OUTPATIENT_CLINIC_OR_DEPARTMENT_OTHER): Payer: MEDICAID | Admitting: Anesthesiology

## 2023-12-10 ENCOUNTER — Encounter (HOSPITAL_COMMUNITY)
Admission: RE | Disposition: A | Discharge status: Home / Self Care | Payer: Self-pay | Source: Home / Self Care | Attending: Surgery

## 2023-12-10 DIAGNOSIS — K4020 Bilateral inguinal hernia, without obstruction or gangrene, not specified as recurrent: Secondary | ICD-10-CM

## 2023-12-10 DIAGNOSIS — J45909 Unspecified asthma, uncomplicated: Secondary | ICD-10-CM | POA: Diagnosis not present

## 2023-12-10 DIAGNOSIS — Z87891 Personal history of nicotine dependence: Secondary | ICD-10-CM | POA: Insufficient documentation

## 2023-12-10 DIAGNOSIS — Z5986 Financial insecurity: Secondary | ICD-10-CM | POA: Insufficient documentation

## 2023-12-10 DIAGNOSIS — Z79899 Other long term (current) drug therapy: Secondary | ICD-10-CM | POA: Diagnosis not present

## 2023-12-10 DIAGNOSIS — Z7951 Long term (current) use of inhaled steroids: Secondary | ICD-10-CM | POA: Insufficient documentation

## 2023-12-10 DIAGNOSIS — Z01818 Encounter for other preprocedural examination: Secondary | ICD-10-CM

## 2023-12-10 DIAGNOSIS — Z8673 Personal history of transient ischemic attack (TIA), and cerebral infarction without residual deficits: Secondary | ICD-10-CM | POA: Insufficient documentation

## 2023-12-10 DIAGNOSIS — F418 Other specified anxiety disorders: Secondary | ICD-10-CM | POA: Diagnosis not present

## 2023-12-10 HISTORY — PX: XI ROBOTIC ASSISTED INGUINAL HERNIA REPAIR WITH MESH: SHX6706

## 2023-12-10 SURGERY — REPAIR, HERNIA, INGUINAL, ROBOT-ASSISTED, LAPAROSCOPIC, USING MESH
Anesthesia: General | Laterality: Bilateral

## 2023-12-10 MED ORDER — CHLORHEXIDINE GLUCONATE 4 % EX SOLN: 60.0000 mL | Freq: Once | CUTANEOUS | Status: DC

## 2023-12-10 MED ORDER — ACETAMINOPHEN-CODEINE 300-30 MG PO TABS
1.0000 | ORAL_TABLET | Freq: Four times a day (QID) | ORAL | 0 refills | Status: DC | PRN
Start: 2023-12-10 — End: 2023-12-21
  Filled 2023-12-10: qty 30, 4d supply, fill #0

## 2023-12-10 MED ORDER — PHENYLEPHRINE HCL (PRESSORS) 10 MG/ML IV SOLN
INTRAVENOUS | Status: DC | PRN
  Administered 2023-12-10 (×3): 160 ug via INTRAVENOUS

## 2023-12-10 MED ORDER — GABAPENTIN 300 MG PO CAPS
300.0000 mg | ORAL_CAPSULE | ORAL | Status: AC
  Administered 2023-12-10: 300 mg via ORAL
  Filled 2023-12-10: qty 1

## 2023-12-10 MED ORDER — SUGAMMADEX SODIUM 200 MG/2ML IV SOLN
INTRAVENOUS | Status: DC | PRN
  Administered 2023-12-10: 200 mg via INTRAVENOUS

## 2023-12-10 MED ORDER — CEFAZOLIN SODIUM-DEXTROSE 2-4 GM/100ML-% IV SOLN
2.0000 g | INTRAVENOUS | Status: AC
  Administered 2023-12-10: 2 g via INTRAVENOUS
  Filled 2023-12-10: qty 100

## 2023-12-10 MED ORDER — DEXAMETHASONE SODIUM PHOSPHATE 10 MG/ML IJ SOLN
INTRAMUSCULAR | Status: AC
Start: 2023-12-10 — End: ?
  Filled 2023-12-10: qty 1

## 2023-12-10 MED ORDER — ROCURONIUM BROMIDE 10 MG/ML (PF) SYRINGE
PREFILLED_SYRINGE | INTRAVENOUS | Status: AC
  Filled 2023-12-10: qty 10

## 2023-12-10 MED ORDER — ONDANSETRON HCL 4 MG/2ML IJ SOLN
INTRAMUSCULAR | Status: AC
  Filled 2023-12-10: qty 2

## 2023-12-10 MED ORDER — ORAL CARE MOUTH RINSE: 15.0000 mL | Freq: Once | OROMUCOSAL | Status: AC

## 2023-12-10 MED ORDER — SUGAMMADEX SODIUM 200 MG/2ML IV SOLN
INTRAVENOUS | Status: AC
  Filled 2023-12-10: qty 2

## 2023-12-10 MED ORDER — KETAMINE HCL 10 MG/ML IJ SOLN
INTRAMUSCULAR | Status: DC | PRN
  Administered 2023-12-10: 30 mg via INTRAVENOUS

## 2023-12-10 MED ORDER — ROCURONIUM BROMIDE 100 MG/10ML IV SOLN
INTRAVENOUS | Status: DC | PRN
  Administered 2023-12-10: 100 mg via INTRAVENOUS
  Administered 2023-12-10: 10 mg via INTRAVENOUS
  Administered 2023-12-10: 20 mg via INTRAVENOUS

## 2023-12-10 MED ORDER — ONDANSETRON HCL 4 MG/2ML IJ SOLN: 4.0000 mg | Freq: Once | INTRAMUSCULAR | Status: DC | PRN

## 2023-12-10 MED ORDER — ACETAMINOPHEN 500 MG PO TABS
1000.0000 mg | ORAL_TABLET | Freq: Once | ORAL | Status: AC
  Administered 2023-12-10: 1000 mg via ORAL
  Filled 2023-12-10: qty 2

## 2023-12-10 MED ORDER — PHENYLEPHRINE HCL-NACL 20-0.9 MG/250ML-% IV SOLN
INTRAVENOUS | Status: DC | PRN
  Administered 2023-12-10: 25 ug/min via INTRAVENOUS

## 2023-12-10 MED ORDER — LACTATED RINGERS IV SOLN: INTRAVENOUS | Status: DC

## 2023-12-10 MED ORDER — CHLORHEXIDINE GLUCONATE 4 % EX SOLN
60.0000 mL | Freq: Once | CUTANEOUS | Status: DC
Start: 2023-12-10 — End: 2023-12-10

## 2023-12-10 MED ORDER — FENTANYL CITRATE (PF) 100 MCG/2ML IJ SOLN
INTRAMUSCULAR | Status: AC
  Filled 2023-12-10: qty 2

## 2023-12-10 MED ORDER — HYDROMORPHONE HCL 1 MG/ML IJ SOLN: 0.2500 mg | INTRAMUSCULAR | Status: DC | PRN

## 2023-12-10 MED ORDER — 0.9 % SODIUM CHLORIDE (POUR BTL) OPTIME
TOPICAL | Status: DC | PRN
  Administered 2023-12-10: 1000 mL

## 2023-12-10 MED ORDER — LIDOCAINE HCL (CARDIAC) PF 100 MG/5ML IV SOSY
PREFILLED_SYRINGE | INTRAVENOUS | Status: DC | PRN
  Administered 2023-12-10: 60 mg via INTRAVENOUS

## 2023-12-10 MED ORDER — MIDAZOLAM HCL 5 MG/5ML IJ SOLN
INTRAMUSCULAR | Status: DC | PRN
  Administered 2023-12-10: 2 mg via INTRAVENOUS

## 2023-12-10 MED ORDER — BUPIVACAINE LIPOSOME 1.3 % IJ SUSP: 20.0000 mL | Freq: Once | INTRAMUSCULAR | Status: DC

## 2023-12-10 MED ORDER — KETAMINE HCL 50 MG/5ML IJ SOSY
PREFILLED_SYRINGE | INTRAMUSCULAR | Status: AC
  Filled 2023-12-10: qty 5

## 2023-12-10 MED ORDER — SODIUM CHLORIDE 0.9 % IV SOLN: INTRAVENOUS | Status: DC | PRN

## 2023-12-10 MED ORDER — BUPIVACAINE-EPINEPHRINE (PF) 0.25% -1:200000 IJ SOLN
INTRAMUSCULAR | Status: AC
Start: 2023-12-10 — End: ?
  Filled 2023-12-10: qty 30

## 2023-12-10 MED ORDER — ONDANSETRON HCL 4 MG/2ML IJ SOLN
INTRAMUSCULAR | Status: DC | PRN
  Administered 2023-12-10: 4 mg via INTRAVENOUS

## 2023-12-10 MED ORDER — PROPOFOL 10 MG/ML IV BOLUS
INTRAVENOUS | Status: DC | PRN
  Administered 2023-12-10: 200 mg via INTRAVENOUS

## 2023-12-10 MED ORDER — ACETAMINOPHEN 500 MG PO TABS: 1000.0000 mg | ORAL_TABLET | ORAL | Status: DC

## 2023-12-10 MED ORDER — TRAMADOL HCL 50 MG PO TABS
50.0000 mg | ORAL_TABLET | Freq: Four times a day (QID) | ORAL | 0 refills | Status: DC | PRN
  Filled 2023-12-10: qty 15, 4d supply, fill #0

## 2023-12-10 MED ORDER — CHLORHEXIDINE GLUCONATE 0.12 % MT SOLN
15.0000 mL | Freq: Once | OROMUCOSAL | Status: AC
  Administered 2023-12-10: 15 mL via OROMUCOSAL

## 2023-12-10 MED ORDER — AMISULPRIDE (ANTIEMETIC) 5 MG/2ML IV SOLN: 10.0000 mg | Freq: Once | INTRAVENOUS | Status: DC | PRN

## 2023-12-10 MED ORDER — BUPIVACAINE-EPINEPHRINE 0.25% -1:200000 IJ SOLN
INTRAMUSCULAR | Status: DC | PRN
  Administered 2023-12-10: 30 mL

## 2023-12-10 MED ORDER — DOCUSATE SODIUM 100 MG PO CAPS
100.0000 mg | ORAL_CAPSULE | Freq: Two times a day (BID) | ORAL | 0 refills | Status: DC
  Filled 2023-12-10: qty 30, 15d supply, fill #0

## 2023-12-10 MED ORDER — OXYCODONE HCL 5 MG PO TABS: 5.0000 mg | ORAL_TABLET | Freq: Once | ORAL | Status: DC | PRN

## 2023-12-10 MED ORDER — FENTANYL CITRATE (PF) 100 MCG/2ML IJ SOLN
INTRAMUSCULAR | Status: DC | PRN
  Administered 2023-12-10: 100 ug via INTRAVENOUS

## 2023-12-10 MED ORDER — PHENYLEPHRINE HCL (PRESSORS) 10 MG/ML IV SOLN
INTRAVENOUS | Status: AC
  Filled 2023-12-10: qty 1

## 2023-12-10 MED ORDER — BUPIVACAINE LIPOSOME 1.3 % IJ SUSP
INTRAMUSCULAR | Status: DC | PRN
  Administered 2023-12-10: 20 mL

## 2023-12-10 MED ORDER — BUPIVACAINE LIPOSOME 1.3 % IJ SUSP
INTRAMUSCULAR | Status: AC
  Filled 2023-12-10: qty 20

## 2023-12-10 MED ORDER — MIDAZOLAM HCL 2 MG/2ML IJ SOLN
INTRAMUSCULAR | Status: AC
  Filled 2023-12-10: qty 2

## 2023-12-10 MED ORDER — LIDOCAINE HCL (PF) 2 % IJ SOLN
INTRAMUSCULAR | Status: AC
  Filled 2023-12-10: qty 5

## 2023-12-10 MED ORDER — OXYCODONE HCL 5 MG/5ML PO SOLN: 5.0000 mg | Freq: Once | ORAL | Status: DC | PRN

## 2023-12-10 SURGICAL SUPPLY — 46 items
ANTIFOG SOL W/FOAM PAD STRL (MISCELLANEOUS) ×1 IMPLANT
APPLICATOR COTTON TIP 6 STRL (MISCELLANEOUS) ×2 IMPLANT
APPLICATOR COTTON TIP 6IN STRL (MISCELLANEOUS) ×2 IMPLANT
BAG COUNTER SPONGE SURGICOUNT (BAG) IMPLANT
BLADE SURG SZ11 CARB STEEL (BLADE) ×1 IMPLANT
CHLORAPREP W/TINT 26 (MISCELLANEOUS) ×1 IMPLANT
COVER SURGICAL LIGHT HANDLE (MISCELLANEOUS) ×1 IMPLANT
COVER TIP SHEARS 8 DVNC (MISCELLANEOUS) ×1 IMPLANT
DERMABOND ADVANCED .7 DNX12 (GAUZE/BANDAGES/DRESSINGS) IMPLANT
DRAPE ARM DVNC X/XI (DISPOSABLE) ×4 IMPLANT
DRAPE COLUMN DVNC XI (DISPOSABLE) ×1 IMPLANT
DRIVER NDL LRG 8 DVNC XI (INSTRUMENTS) ×1 IMPLANT
DRIVER NDL MEGA SUTCUT DVNCXI (INSTRUMENTS) ×2 IMPLANT
DRIVER NDLE LRG 8 DVNC XI (INSTRUMENTS) ×1 IMPLANT
DRIVER NDLE MEGA SUTCUT DVNCXI (INSTRUMENTS) ×2 IMPLANT
ELECT REM PT RETURN 15FT ADLT (MISCELLANEOUS) ×1 IMPLANT
GLOVE BIO SURGEON STRL SZ 6 (GLOVE) ×2 IMPLANT
GLOVE INDICATOR 6.5 STRL GRN (GLOVE) ×2 IMPLANT
GOWN STRL REUS W/ TWL LRG LVL3 (GOWN DISPOSABLE) ×2 IMPLANT
GRASPER TIP-UP FEN DVNC XI (INSTRUMENTS) ×1 IMPLANT
IRRIG SUCT STRYKERFLOW 2 WTIP (MISCELLANEOUS) IMPLANT
IRRIGATION SUCT STRKRFLW 2 WTP (MISCELLANEOUS) IMPLANT
KIT BASIN OR (CUSTOM PROCEDURE TRAY) ×1 IMPLANT
KIT TURNOVER KIT A (KITS) IMPLANT
MESH 3DMAX MID 4X6 LT LRG (Mesh General) IMPLANT
MESH 3DMAX MID 4X6 RT LRG (Mesh General) IMPLANT
NDL HYPO 22X1.5 SAFETY MO (MISCELLANEOUS) ×1 IMPLANT
NDL INSUFFLATION 14GA 120MM (NEEDLE) ×1 IMPLANT
NEEDLE HYPO 22X1.5 SAFETY MO (MISCELLANEOUS) ×1 IMPLANT
NEEDLE INSUFFLATION 14GA 120MM (NEEDLE) ×1 IMPLANT
PACK CARDIOVASCULAR III (CUSTOM PROCEDURE TRAY) ×1 IMPLANT
PAD POSITIONING PINK XL (MISCELLANEOUS) ×1 IMPLANT
SCISSORS MNPLR CVD DVNC XI (INSTRUMENTS) ×1 IMPLANT
SEAL UNIV 5-12 XI (MISCELLANEOUS) ×3 IMPLANT
SOL ELECTROSURG ANTI STICK (MISCELLANEOUS) ×1 IMPLANT
SOLUTION ANTFG W/FOAM PAD STRL (MISCELLANEOUS) ×1 IMPLANT
SOLUTION ELECTROSURG ANTI STCK (MISCELLANEOUS) ×1 IMPLANT
SPIKE FLUID TRANSFER (MISCELLANEOUS) ×1 IMPLANT
SUT MNCRL AB 4-0 PS2 18 (SUTURE) ×1 IMPLANT
SUT STRATA 2-0 15 CT-1.5 (SUTURE) ×1 IMPLANT
SUT STRATAFIX SPIRAL PDS3-0 (SUTURE) IMPLANT
SUT VIC AB 3-0 SH 27XBRD (SUTURE) ×1 IMPLANT
SYR 10ML LL (SYRINGE) ×1 IMPLANT
SYR 20ML LL LF (SYRINGE) ×1 IMPLANT
TOWEL OR 17X26 10 PK STRL BLUE (TOWEL DISPOSABLE) ×1 IMPLANT
TUBING INSUFFLATION 10FT LAP (TUBING) ×1 IMPLANT

## 2023-12-10 NOTE — Discharge Instructions (Addendum)
 HERNIA REPAIR: POST OP INSTRUCTIONS   EAT Gradually transition to a high fiber diet with a fiber supplement over the next few weeks after discharge.  Start with a pureed / full liquid diet (see below)  WALK Walk an hour a day (cumulative- not all at once).  Control your pain to do that.    CONTROL PAIN Control pain so that you can walk, sleep, tolerate sneezing/coughing, and go up/down stairs.  HAVE A BOWEL MOVEMENT DAILY Keep your bowels regular to avoid problems.  OK to try a laxative to override constipation.  OK to use an antidiarrheal to slow down diarrhea.  Call if not better after 2 tries  CALL IF YOU HAVE PROBLEMS/CONCERNS Call if you are still struggling despite following these instructions. Call if you have concerns not answered by these instructions  ######################################################################    DIET: Follow a light bland diet & liquids the first 24 hours after arrival home, such as soup, liquids, starches, etc.  Be sure to drink plenty of fluids.  Quickly advance to a usual solid diet within a few days.  Avoid fast food or heavy meals initially as you are more likely to get nauseated or have irregular bowels.  Take your usually prescribed home medications unless otherwise directed.  PAIN CONTROL: Pain is best controlled by a usual combination of three different methods TOGETHER: Ice/Heat Over the counter pain medication Prescription pain medication Most patients will experience some swelling and bruising around the hernia(s) such as the bellybutton, groins, or old incisions.  Ice packs or heating pads (30-60 minutes up to 6 times a day) will help. Use ice for the first few days to help decrease swelling and bruising, then switch to heat to help relax tight/sore spots and speed recovery.  Some people prefer to use ice alone, heat alone, alternating between ice & heat.  Experiment to what works for you.  Swelling and bruising can take several  weeks to resolve.   It is helpful to take an over-the-counter pain medication regularly for the first days: Naproxen (Aleve, etc)  Two 220mg  tabs twice a day OR Ibuprofen (Advil, etc) Three 200mg  tabs four times a day (every meal & bedtime) AND Acetaminophen (Tylenol, etc) 325-650mg  four times a day (every meal & bedtime) A  prescription for pain medication should be given to you upon discharge.  Take your pain medication as prescribed, IF NEEDED.  If you are having problems/concerns with the prescription medicine (does not control pain, nausea, vomiting, rash, itching, etc), please call us [phone] to see if we need to switch you to a different pain medicine that will work better for you and/or control your side effect better. If you need a refill on your pain medication, please contact your pharmacy.  They will contact our office to request authorization. Prescriptions will not be filled after 5 pm or on week-ends.  Avoid getting constipated.  Between the surgery and the pain medications, it is common to experience some constipation.  Increasing fluid intake and taking a fiber supplement (such as Metamucil, Citrucel, FiberCon, MiraLax, etc) 1-2 times a day regularly will usually help prevent this problem from occurring.  A mild laxative (prune juice, Milk of Magnesia, MiraLax, etc) should be taken according to package directions if there are no bowel movements after 48 hours.    Wash / shower every day, starting 2 days after surgery.  You may shower over the skin glue which are waterproof.  No rubbing, scrubbing, lotions or ointments to  incision(s). Do not soak or submerge incisions.   Glue will flake off after about 2 weeks.  You may leave the incision open to air.  You may replace a dressing/Band-Aid to cover an incision for comfort if you wish.  Continue to shower over incision(s) after the dressing is off.  ACTIVITIES as tolerated:   You may resume regular (light) daily activities  beginning the next day--such as daily self-care, walking, climbing stairs--gradually increasing activities as tolerated.  Control your pain so that you can walk an hour a day.  If you can walk 30 minutes without difficulty, it is safe to try more intense activity such as jogging, treadmill, bicycling, low-impact aerobics, swimming, etc. Refrain from the most intensive and strenuous activity such as sit-ups, heavy lifting, contact sports, etc  Refrain from any heavy lifting or straining until 6 weeks after surgery.   DO NOT PUSH THROUGH PAIN.  Let pain be your guide: If it hurts to do something, don't do it.  Pain is your body warning you to avoid that activity for another week until the pain goes down. You may drive when you are no longer taking prescription pain medication, you can comfortably wear a seatbelt, and you can safely maneuver your car and apply brakes. You may have sexual intercourse when it is comfortable.   FOLLOW UP in our office Please call [facility] at [phone] to set up an appointment to see your surgeon in the office for a follow-up appointment approximately 2-3 weeks after your surgery. Make sure that you call for this appointment the day you arrive home to insure a convenient appointment time.  9.  If you have disability of FMLA / Family leave forms, please bring the forms to the office for processing.  (do not give to your surgeon).  WHEN TO CALL us [phone]: Poor pain control Reactions / problems with new medications (rash/itching, nausea, etc)  Fever over 101.5 F (38.5 C) Inability to urinate Nausea and/or vomiting Worsening swelling or bruising Continued bleeding from incision. Increased pain, redness, or drainage from the incision   The clinic staff is available to answer your questions during regular business hours (8:30am-5pm).  Please don't hesitate to call and ask to speak to one of our nurses for clinical concerns.   If you have a medical emergency, go to  the nearest emergency room or call 911.  A surgeon from [facility] is always on call at the hospitals in [facility], [place] [address], [place], [place]  [address] ?  [address] [address], [place], [place]   [address] MAIN: [id number] ? TOLL FREE: [phone] ? FAX: [phone] [email or web address]

## 2023-12-10 NOTE — H&P (Signed)
 Gregory Bishop [id number]   Referring Provider:  Herbie Drape, MD   Subjective   Chief Complaint: New Consultation (hernia)     History of Present Illness:    Very pleasant 48yo male with history of stroke, history of alcohol use disorder on naltrexone, tobacco abuse (quit 3 weeks ago), asthma with recent hospitalization 10/13/23-10/18/23 for the same along with viral LLL pna. Treatment included steroids, abx, bronchodilators. Subsequently followed up with PCP on 1/15 and while he was doing better from a respiratory standpoint and had quit smoking for the prior 2 weeks, had noted an increasingly large left inguinal hernia.  He and his significant other states this is present for about 2-1/2 years, but has gradually increased in size.  He denies any pain, no associated GI or urinary symptoms.  No previous abdominal surgery.  Currently does not have to do any heavy lifting.  Labs done earlier this month during ER workup for shortness of breath/recent admission included unremarkable BMP, low vit D, CBC with mild leukocytosis 13.6. Xray 10/13/23 with pneumonia.  Review of Systems: A complete review of systems was obtained from the patient.  I have reviewed this information and discussed as appropriate with the patient.  See HPI as well for other ROS.   Medical History: Past Medical History:  Diagnosis Date   Anxiety    Asthma, unspecified asthma severity, unspecified whether complicated, unspecified whether persistent (HHS-HCC)    Substance abuse (CMS/HHS-HCC)     There is no problem list on file for this patient.   Past Surgical History:  Procedure Laterality Date   leg surgery       No Known Allergies  Current Outpatient Medications on File Prior to Visit  Medication Sig Dispense Refill   acamprosate (CAMPRAL) 333 mg tablet Take 666 mg by mouth     albuterol MDI, PROVENTIL, VENTOLIN, PROAIR, HFA 90 mcg/actuation inhaler Inhale 1-2 inhalations into the lungs     cyanocobalamin  (VITAMIN B12) 1000 MCG tablet Take 1,000 mcg by mouth once daily     ergocalciferol, vitamin D2, 1,250 mcg (50,000 unit) capsule Take 50,000 Units by mouth every 7 (seven) days     fluticasone propion-salmeteroL (ADVAIR DISKUS) 100-50 mcg/dose diskus inhaler Inhale 1 Puff into the lungs 2 (two) times daily     naltrexone (REVIA) 50 mg tablet Take 50 mg by mouth at bedtime     OLANZapine (ZYPREXA) 10 MG tablet Take 10 mg by mouth at bedtime     thiamine (VITAMIN B-1) 100 MG tablet Take 100 mg by mouth once daily     No current facility-administered medications on file prior to visit.    Family History  Problem Relation Age of Onset   Obesity Mother    Obesity Brother      Social History   Tobacco Use  Smoking Status Former   Types: Cigarettes  Smokeless Tobacco Never     Social History   Socioeconomic History   Marital status: Single  Tobacco Use   Smoking status: Former    Types: Cigarettes   Smokeless tobacco: Never  Vaping Use   Vaping status: Never Used  Substance and Sexual Activity   Alcohol use: Not Currently   Drug use: Never   Social Drivers of Health   Financial Resource Strain: High Risk (09/16/2023)   Received from [facility]   Overall Financial Resource Strain (CARDIA)    Difficulty of Paying Living Expenses: Very hard  Food Insecurity: No Food Insecurity (10/14/2023)   Received  from [facility]   Hunger Vital Sign    Worried About Running Out of Food in the Last Year: Never true    Ran Out of Food in the Last Year: Never true  Transportation Needs: No Transportation Needs (10/14/2023)   Received from [facility] - Transportation    Lack of Transportation (Medical): No    Lack of Transportation (Non-Medical): No  Physical Activity: Unknown (09/16/2023)   Received from [facility]   Exercise Vital Sign    Days of Exercise per Week: Patient declined  Stress: Stress Concern Present (09/16/2023)   Received from [facility] - Occupational Stress Questionnaire    Feeling of Stress : Very much  Social Connections: Moderately Isolated (10/14/2023)   Received from [facility]   Social Connection and Isolation Panel [NHANES]    Frequency of Communication with Friends and Family: Three times a week    Frequency of Social Gatherings with Friends and Family: Never    Attends Religious Services: Never    Database administrator or Organizations: No    Attends Banker Meetings: Never    Marital Status: Living with partner  Housing Stability: Low Risk  (10/14/2023)   Received from [facility]   Housing Stability Vital Sign    Unable to Pay for Housing in the Last Year: No    Number of Times Moved in the Last Year: 0    Homeless in the Last Year: No    Objective:    Vitals:   11/05/23 1540  BP: 116/72  Pulse: 94  Temp: 36.7 C (98 F)  SpO2: 96%  Weight: 66 kg (145 lb 9.6 oz)  Height: 180.3 cm (5\' 11" )  PainSc: 0-No pain    Body mass index is 20.31 kg/m.  Gen: A&Ox3, no distress  Unlabored respirations Abd s/nt/nd.  He has bilateral reducible inguinal hernias, left greater than right.  No tenderness or overlying skin change.  Assessment and Plan:  Diagnoses and all orders for this visit:  Non-recurrent bilateral inguinal hernia without obstruction or gangrene    We discussed the relevant anatomy and we discussed options for repair.  I recommend a robotic approach to repair both sides and went over the technique of the procedure.  Discussed risks of bleeding, infection, pain, scarring, injury to structures in the area including nerves, blood vessels, bowel, bladder, risk of chronic pain, hernia recurrence, risk of seroma or hematoma, urinary retention, and risks of general anesthesia including cardiovascular, pulmonary, and thromboembolic complications.  We discussed typical postop recovery, timeline, and activity limitations.  We also discussed the option of ongoing  observation, with high rate of ultimately returning for surgery and risk of increasing size/symptoms from the hernia as well as incarceration/strangulation and went over symptoms that should prompt him to seek emergency treatment.  Questions were welcomed and answered to the patient's satisfaction. Patient wishes to proceed with scheduling.   Emmalou Hunger Carlye Grippe, MD

## 2023-12-10 NOTE — Op Note (Signed)
 Operative Note  Gregory Bishop  [id number]  [id number]  12/10/2023   Surgeon: Phylliss Blakes MD FACS   Procedure performed: Robotic bilateral inguinal hernia repairs (transabdominal preperitoneal)   Preop diagnosis: Bilateral inguinal hernias Post-op diagnosis/intraop findings: Bilateral moderate direct inguinal hernias, left greater than right, small right indirect inguinal hernia   Specimens: no Retained items: no  EBL: Minimal cc Complications: none   Description of procedure: After confirming informed consent the patient was taken to the operating room and placed supine on operating room table where general endotracheal anesthesia was initiated, preoperative antibiotics were administered, SCDs applied, foley inserted (removed at the end of the case) and a formal timeout was performed. The abdomen was prepped and draped in usual sterile fashion. Peritoneal access was gained with a left subcostal Veress needle and insufflation to 15 mmHg ensued without incident.  A periumbilical 8 mm trocar was inserted along with the camera; the abdomen was inspected and confirmed to be free of any injury from our entry or gross abnormalities. The patient was then placed in Trendelenburg and the pelvis inspected.  He was confirmed to have bilateral direct inguinal hernias, left greater than right, as well as a small right sided indirect inguinal hernia.  Under direct visualization, bilateral laparoscopic assisted taps blocks were performed using Exparel mixed with quarter percent Marcaine with epinephrine for postoperative pain control and to bilateral 8 mm trocars were placed.  The robot was then docked and sutures, mesh and instruments were all inserted under direct visualization.   Beginning on the patient's left side, a peritoneal flap was developed using sharp and cautery dissection spanning from the anterior superior iliac spine to the medial umbilical ligament. The flap was bluntly dissected off the  anterior abdominal wall until the moderate to large direct hernia sac was reduced, the Cooper's ligament as well as 2 cm inferior to this exposed medially and sufficient room for the mesh was created inferiorly.  The gonadal vessels and vas deferens were peritonealized.  The space of Retzius was developed and the bladder was carefully protected throughout the dissection.  The iliac vessels were visualized and carefully protected.  Ultimately there was excellent exposure of the myopectineal orifice.  Hemostasis was confirmed in the dissection field. A Bard 3D max left-sided large mid weight mesh was placed with excellent overlap of the direct, indirect, and femoral spaces. The mesh was secured to the Cooper's ligament and superiorly on either side of the inferior epigastric vessels using simple interrupted 3-0 Vicryl sutures. The peritoneal flap was then brought back up to completely cover the mesh, ensuring that the mesh remained flush against the abdominal wall and did not fold or buckle while doing so.  The peritoneum was closed with a running imbricating 3-0 stratafix suture. At completion there was no exposed mesh present. Hemostasis was confirmed.  We then turned to the right side where he was noted to have a smaller but still moderate direct inguinal hernia and a small indirect defect as well.  In an identical fashion, the peritoneal flap was developed spanning from the anterior superior iliac spine to the medial umbilical ligament.  This was dissected off the abdominal wall developing a preperitoneal space and progressing medially and laterally.  The direct hernia sac was reduced.  The space of Retzius was developed, carefully protecting the bladder.  The Cooper's ligament was exposed medially as well as 2 cm inferior to this.  The vas deferens and gonadal vessels were peritonealized.  The iliac vessels were visualized  and carefully protected.  The lateral compartment was developed to create sufficient room  for the mesh.  Ultimately there was again excellent exposure of the myopectineal orifice on this side as well.  Hemostasis was confirmed.  A Bard 3D max right sided large mid weight mesh was then placed with excellent overlap of the direct, indirect and femoral spaces.  This was secured to Cooper's ligament as well as superiorly on either side of the inferior epigastric vessels with simple interrupted 3-0 Vicryl sutures.  The peritoneal flap was then brought back up to completely cover the mesh, ensuring that the mesh remained flush against the abdominal wall and did not buckle or fold while doing this.  The peritoneum was closed again with a running imbricating 3-0 stratafix suture.  Again at completion there is no exposed mesh present.  The abdominal cavity was once again surveyed and hemostasis was excellent.  There were no additional findings.  Sutures were removed under direct visualization and the robotic instruments were removed.  The robot was then undocked. The abdomen was desufflated and trocars were removed.  The skin incisions were closed with subcuticular 4-0 Monocryl and Dermabond. The patient was then awakened, extubated and taken to PACU in stable condition.  All counts were correct at the completion of the case.

## 2023-12-10 NOTE — Anesthesia Preprocedure Evaluation (Addendum)
 Anesthesia Evaluation  Patient identified by MRN, date of birth, ID band Patient awake    Reviewed: Allergy & Precautions, NPO status , Patient's Chart, lab work & pertinent test results  Airway Mallampati: II  TM Distance: >3 FB Neck ROM: Full    Dental  (+) Teeth Intact, Dental Advisory Given   Pulmonary asthma (uses albuterol daily, last used this AM) , former smoker   Pulmonary exam normal breath sounds clear to auscultation       Cardiovascular negative cardio ROS Normal cardiovascular exam Rhythm:Regular Rate:Normal     Neuro/Psych  PSYCHIATRIC DISORDERS Anxiety Depression    CVA, No Residual Symptoms    GI/Hepatic negative GI ROS, Neg liver ROS,,,  Endo/Other  negative endocrine ROS    Renal/GU negative Renal ROS  negative genitourinary   Musculoskeletal negative musculoskeletal ROS (+)    Abdominal   Peds  Hematology negative hematology ROS (+) Hb 13.4, plt 256   Anesthesia Other Findings   Reproductive/Obstetrics negative OB ROS                             Anesthesia Physical Anesthesia Plan  ASA: 2  Anesthesia Plan: General   Post-op Pain Management: Tylenol PO (pre-op)*   Induction: Intravenous  PONV Risk Score and Plan: Ondansetron, Dexamethasone, Midazolam and Treatment may vary due to age or medical condition  Airway Management Planned: Oral ETT  Additional Equipment: None  Intra-op Plan:   Post-operative Plan: Extubation in OR  Informed Consent: I have reviewed the patients History and Physical, chart, labs and discussed the procedure including the risks, benefits and alternatives for the proposed anesthesia with the patient or authorized representative who has indicated his/her understanding and acceptance.     Dental advisory given  Plan Discussed with: CRNA  Anesthesia Plan Comments:         Anesthesia Quick Evaluation

## 2023-12-10 NOTE — Anesthesia Postprocedure Evaluation (Signed)
 Anesthesia Post Note  Patient: Gregory Bishop  Procedure(s) Performed: ROBOTIC BILATERAL INGUINAL HERNIA REPAIRS (Bilateral)     Patient location during evaluation: PACU Anesthesia Type: General Level of consciousness: awake and alert, oriented and patient cooperative Pain management: pain level controlled Vital Signs Assessment: post-procedure vital signs reviewed and stable Respiratory status: spontaneous breathing, nonlabored ventilation and respiratory function stable Cardiovascular status: blood pressure returned to baseline and stable Postop Assessment: no apparent nausea or vomiting Anesthetic complications: no   No notable events documented.  Last Vitals:  Vitals:   12/10/23 0845  BP: 123/80  Pulse: (!) 52  Resp: 16  Temp: 36.5 C  SpO2: 99%    Last Pain:  Vitals:   12/10/23 0924  TempSrc:   PainSc: 0-No pain                 Lannie Fields

## 2023-12-10 NOTE — Anesthesia Procedure Notes (Signed)
 Procedure Name: Intubation Date/Time: 12/10/2023 11:27 AM  Performed by: Orest Dikes, CRNAPre-anesthesia Checklist: Patient identified, Emergency Drugs available, Suction available and Patient being monitored Patient Re-evaluated:Patient Re-evaluated prior to induction Oxygen Delivery Method: Circle system utilized Preoxygenation: Pre-oxygenation with 100% oxygen Induction Type: IV induction Ventilation: Mask ventilation without difficulty Laryngoscope Size: Mac and 4 Grade View: Grade I Tube type: Oral Tube size: 7.5 mm Number of attempts: 1 Airway Equipment and Method: Stylet Placement Confirmation: ETT inserted through vocal cords under direct vision, positive ETCO2 and breath sounds checked- equal and bilateral Secured at: 21 cm Tube secured with: Tape Dental Injury: Teeth and Oropharynx as per pre-operative assessment

## 2023-12-10 NOTE — Transfer of Care (Signed)
 Immediate Anesthesia Transfer of Care Note  Patient: Gregory Bishop  Procedure(s) Performed: ROBOTIC BILATERAL INGUINAL HERNIA REPAIRS (Bilateral)  Patient Location: PACU  Anesthesia Type:General  Level of Consciousness: drowsy  Airway & Oxygen Therapy: Patient Spontanous Breathing and Patient connected to face mask oxygen  Post-op Assessment: Report given to RN and Post -op Vital signs reviewed and stable  Post vital signs: Reviewed and stable  Last Vitals:  Vitals Value Taken Time  BP 117/81 12/10/23 1337  Temp    Pulse 75 12/10/23 1339  Resp 10 12/10/23 1339  SpO2 100 % 12/10/23 1339  Vitals shown include unfiled device data.  Last Pain:  Vitals:   12/10/23 0924  TempSrc:   PainSc: 0-No pain         Complications: No notable events documented.

## 2023-12-13 ENCOUNTER — Encounter (HOSPITAL_COMMUNITY): Payer: Self-pay | Admitting: Surgery

## 2023-12-15 ENCOUNTER — Ambulatory Visit: Payer: MEDICAID | Admitting: Family Medicine

## 2023-12-19 ENCOUNTER — Other Ambulatory Visit: Payer: Self-pay | Admitting: Family Medicine

## 2023-12-20 ENCOUNTER — Other Ambulatory Visit: Payer: Self-pay

## 2023-12-20 ENCOUNTER — Ambulatory Visit (INDEPENDENT_AMBULATORY_CARE_PROVIDER_SITE_OTHER): Payer: MEDICAID | Admitting: Licensed Clinical Social Worker

## 2023-12-20 DIAGNOSIS — F331 Major depressive disorder, recurrent, moderate: Secondary | ICD-10-CM | POA: Diagnosis not present

## 2023-12-20 DIAGNOSIS — F1091 Alcohol use, unspecified, in remission: Secondary | ICD-10-CM | POA: Diagnosis not present

## 2023-12-20 DIAGNOSIS — F4310 Post-traumatic stress disorder, unspecified: Secondary | ICD-10-CM | POA: Diagnosis not present

## 2023-12-20 DIAGNOSIS — F1021 Alcohol dependence, in remission: Secondary | ICD-10-CM

## 2023-12-20 NOTE — Progress Notes (Signed)
 THERAPIST PROGRESS NOTE   Virtual Visit via Video Note  I connected with [name] on 12/20/23 at  3:00 PM EDT by a video enabled telemedicine application and verified that I am speaking with the correct person using two identifiers.  Location: Patient: [employer]  Provider: Providers Home    I discussed the limitations of evaluation and management by telemedicine and the availability of in person appointments. The patient expressed understanding and agreed to proceed.     I discussed the assessment and treatment plan with the patient. The patient was provided an opportunity to ask questions and all were answered. The patient agreed with the plan and demonstrated an understanding of the instructions.   The patient was advised to call back or seek an in-person evaluation if the symptoms worsen or if the condition fails to improve as anticipated.  I provided 30 minutes of non-face-to-face time during this encounter.   [name], LCSW   Participation Level: Active  Behavioral Response: CasualAlertAnxious  Type of Therapy: Individual Therapy  Treatment Goals addressed:   Active     PTSD-Trauma Disorder CCP Problem  1 PTSD       Decrease PHQ-9 down 10  (Progressing)     Start:  08/18/22    Expected End:  05/12/24         Decrease GAD-7 down by 10  (Progressing)     Start:  08/18/22    Expected End:  05/12/24         Gain income either through SSDI or work.  (Progressing)     Start:  08/18/22    Expected End:  05/12/24         ENCOURAGE Zeus TO PRACTICE BREATHING RETRAINING FOR 10 MINUTES, 3 TIMES PER DAY (Completed)     Start:  08/18/22    End:  01/21/23      WORK WITH Uno TO COMPLETE THE PHQ-9 DURING [facility] INDIVIDUAL SESSION (Completed)     Start:  08/18/22    End:  10/01/22      EDUCATE Ithan ON COMMON REACTIONS TO A TRAUMATIC EXPERIENCE (Completed)     Start:  08/18/22    End:  11/18/22      WORK WITH Benjermin TO CONSTRUCT AN "IN  VIVO EXPOSURE HIERARCHY" A LIST OF THE SITUATIONS, PEOPLE, & PLACES THAT Vang AVOIDS BECAUSE OF THEIR TRAUMA (Completed)     Start:  08/18/22    End:  11/18/22      ENCOURAGE Jhonny TO PRACTICE ON IN VIVO EXPOSURE ASSIGNMENT BETWEEN INDIVIDUAL SESSIONS AND TRACK THEIR RESPONSE USING PRE-, POST-, AND PEAK- SUDS SCORE (Completed)     Start:  08/18/22    End:  11/18/22         ProgressTowards Goals: Progressing  Interventions: Supportive   Suicidal/Homicidal: Nowithout intent/plan  Therapist Response:   Mikle Bosworth was alert and oriented x 5.  He was pleasant, cooperative, and maintained good eye contact.  He engaged well in therapy session and was dressed casually.  He presented today with euthymic mood\affect.  Uno reports primary stressors today as illness.  He reports on 2/28 he was admitted for hernia surgery.  This was a planned procedure and Bowe was nervous about this.  This is as evidenced by symptoms are tension, worry, and restlessness prior to the procedure.  Thorin reports that it went away faster than he anticipated.  He reports when he came to he thought that they were still taking him back for the surgical procedure.  Patient reports that he has a 2 to 4-week recovery prior to getting his stitches removed.  Patient reports that he will be on another 2 to 4 weeks weight restriction that will gradually increase with the doctor's permission.  Patient reports that this is just an time prescribing to play basketball.  Rob reports overall his mental health is doing well.  Interventions/plan: LCSW utilized supportive therapy for praise and encouragement.  LCSW spoke with patient about stressors today for illness.  Patient reports no episodes of anger or mood swings.  Patient denies any suicidal or homicidal ideations.  Patient denies any auditory or visual hallucinations.  LCSW utilized psychoanalytic therapy for patient to express thoughts, feelings and emotions in session using  nonjudgmental stance.    Plan: Return again in 3 weeks.  Diagnosis: MDD (major depressive disorder), recurrent episode, moderate (HCC)  PTSD (post-traumatic stress disorder)  Alcohol use disorder, severe, in sustained remission (HCC)  Collaboration of Care: Other None today   Patient/Guardian was advised Release of Information must be obtained prior to any record release in order to collaborate their care with an outside provider. Patient/Guardian was advised if they have not already done so to contact the registration department to sign all necessary forms in order for [place] to release information regarding their care.   Consent: Patient/Guardian gives verbal consent for treatment and assignment of benefits for services provided during this visit. Patient/Guardian expressed understanding and agreed to proceed.   [name], LCSW 12/20/2023

## 2023-12-21 ENCOUNTER — Ambulatory Visit (INDEPENDENT_AMBULATORY_CARE_PROVIDER_SITE_OTHER): Payer: MEDICAID | Admitting: Family Medicine

## 2023-12-21 ENCOUNTER — Other Ambulatory Visit: Payer: Self-pay

## 2023-12-21 ENCOUNTER — Other Ambulatory Visit: Payer: Self-pay | Admitting: Student

## 2023-12-21 VITALS — BP 105/70 | HR 80 | Temp 98.9°F | Resp 18 | Wt 146.0 lb

## 2023-12-21 DIAGNOSIS — F331 Major depressive disorder, recurrent, moderate: Secondary | ICD-10-CM | POA: Diagnosis not present

## 2023-12-21 DIAGNOSIS — Z8719 Personal history of other diseases of the digestive system: Secondary | ICD-10-CM

## 2023-12-21 DIAGNOSIS — S301XXA Contusion of abdominal wall, initial encounter: Secondary | ICD-10-CM | POA: Diagnosis not present

## 2023-12-21 DIAGNOSIS — Z9889 Other specified postprocedural states: Secondary | ICD-10-CM

## 2023-12-21 MED ORDER — OLANZAPINE 10 MG PO TABS
10.0000 mg | ORAL_TABLET | Freq: Every day | ORAL | 0 refills | Status: DC
  Filled 2023-12-21: qty 30, 30d supply, fill #0

## 2023-12-21 MED ORDER — ACETAMINOPHEN-CODEINE 300-30 MG PO TABS
1.0000 | ORAL_TABLET | Freq: Four times a day (QID) | ORAL | 0 refills | Status: DC | PRN
  Filled 2023-12-21: qty 15, 2d supply, fill #0

## 2023-12-21 NOTE — Assessment & Plan Note (Signed)
 Stable. He continues to work with behavioral health. As he is running out of his olanzapine, I will fill this today. He should have Dr. Cyndie Chime provide further refills.

## 2023-12-21 NOTE — Progress Notes (Signed)
 [facility] [address] [place] [address] Dept: [phone] Dept Fax: [phone]  Office Visit  Subjective:    Patient ID: Gregory Bishop, male    DOB: 1975/11/10, 48 y.o..   MRN: [id number]  Chief Complaint  Patient presents with   surgery post-op     Pt is here for surgery post-op hernia repair on 12/10/2023. Pt C/O of frequent dizziness for a few years after waking. Pt is due for PVC vaccine and RX refill request.   History of Present Illness:  Patient is in today for post-operative assessment. Mr. Buenger had laparoscopic bilateral inguinal hernia repairs on 12/10/2023. He notes his abdominal wounds are healing well. He has had a persistent area of swelling in the left groin near the site of the left hernia. This has not been tender. He is having some mild residual post-operative pain.   Past Medical History: Patient Active Problem List   Diagnosis Date Noted   Mild persistent asthma 10/27/2023   Inguinal hernia 10/27/2023   Inadequate sleep hygiene 10/22/2023   Vitamin D deficiency 09/20/2023   Erectile dysfunction 03/26/2023   Memory impairment 02/05/2023   Nightmares 01/30/2022   Wheezing 12/23/2021   Tobacco use disorder, mild, in early remission 12/23/2021   Insomnia 12/05/2021   Alcohol use disorder, severe, in sustained remission (HCC) 07/24/2021   PTSD (post-traumatic stress disorder) 07/21/2021   MDD (major depressive disorder), recurrent episode, moderate (HCC) 07/21/2021   Dizziness 05/22/2021   Past Surgical History:  Procedure Laterality Date   LEG SURGERY Left 2014   XI ROBOTIC ASSISTED INGUINAL HERNIA REPAIR WITH MESH Bilateral 12/10/2023   Procedure: ROBOTIC BILATERAL INGUINAL HERNIA REPAIRS;  Surgeon: Berna Bue, MD;  Location: [facility];  Service: General;  Laterality: Bilateral;   Family History  Problem Relation Age of Onset   Diabetes Maternal Uncle    Diabetes Maternal Grandfather     Outpatient Medications Prior to Visit  Medication Sig Dispense Refill   acamprosate (CAMPRAL) 333 MG tablet Take 2 tablets (666 mg total) by mouth 3 (three) times daily with meals. 360 tablet 0   albuterol (VENTOLIN HFA) 108 (90 Base) MCG/ACT inhaler Inhale 1-2 puffs into the lungs every 4 (four) hours as needed for wheezing or shortness of breath. 18 g 3   cetirizine (ZYRTEC ALLERGY) 10 MG tablet Take 1 tablet (10 mg total) by mouth daily. (Patient taking differently: Take 10 mg by mouth daily as needed for allergies.) 28 tablet 0   cyanocobalamin (VITAMIN B12) 1000 MCG tablet Take 1 tablet (1,000 mcg total) by mouth daily. 60 tablet 0   fluticasone-salmeterol (ADVAIR) 100-50 MCG/ACT AEPB Inhale 1 puff into the lungs 2 (two) times daily. 60 each 3   MILK THISTLE PO Take by mouth daily. 1 dropper     Multiple Vitamin (MULTIVITAMIN) LIQD Take 15 mLs by mouth daily.     naltrexone (DEPADE) 50 MG tablet Take 1 tablet (50 mg total) by mouth at bedtime. 60 tablet 0   NON FORMULARY Take 1-2 tablets by mouth See admin instructions. Emergen-C Citrus-Ginger Gummies,  Turmeric and Ginger, Immune Support gummies- Chew 1-2 gummies as needed     OVER THE COUNTER MEDICATION Take 1 capsule by mouth daily as needed (Allergies). Clear Lungs     sildenafil (VIAGRA) 50 MG tablet Take 1 tablet (50 mg total) by mouth daily as needed for erectile dysfunction. 10 tablet 11   thiamine (VITAMIN B1) 100 MG tablet Take 1 tablet (100  mg total) by mouth daily. 60 tablet 0   Vitamin D, Ergocalciferol, (DRISDOL) 1.25 MG (50000 UNIT) CAPS capsule Take 1 capsule (50,000 Units total) by mouth every 7 (seven) days. 12 capsule 0   acetaminophen-codeine (TYLENOL #3) 300-30 MG tablet Take 1-2 tablets by mouth every 6 (six) hours as needed for severe pain (pain score 7-10). Pain not relieved by other measures like ice, ibuprofen, plain tylenol. Be sure not to exceed 4000mg  of tylenol per 24 hour period. 30 tablet 0   OLANZapine  (ZYPREXA) 10 MG tablet Take 1 tablet (10 mg total) by mouth at bedtime. 30 tablet 0   gabapentin (NEURONTIN) 100 MG capsule Take 1 capsule (100 mg total) by mouth daily as needed (anxiety, alcohol craving). (Patient taking differently: Take 100 mg by mouth daily.) 30 capsule 0   mirtazapine (REMERON) 15 MG tablet Take 1 tablet (15 mg total) by mouth at bedtime. (Patient not taking: Reported on 12/21/2023) 60 tablet 0   No facility-administered medications prior to visit.   Allergies  Allergen Reactions   Other      Objective:   Today's Vitals   12/21/23 1535  BP: 105/70  Pulse: 80  Resp: 18  Temp: 98.9 F (37.2 C)  TempSrc: Temporal  SpO2: 97%  Weight: 146 lb (66.2 kg)  PainSc: 0-No pain   Body mass index is 20.36 kg/m.   General: Well developed, well nourished. No acute distress. Abdomen: Soft, non-tender. Surgical wounds are clean and dry. There remains a 4 cm soft, ballotable   area of swelling over the left inguinal area.  Psych: Alert and oriented. Normal mood and affect.  Health Maintenance Due  Topic Date Due   Pneumococcal Vaccine 39-37 Years old (1 of 2 - PCV) Never done   Colonoscopy  Never done   COVID-19 Vaccine (3 - Pfizer risk series) 08/26/2022     Assessment & Plan:   Problem List Items Addressed This Visit       Other   MDD (major depressive disorder), recurrent episode, moderate (HCC)   Stable. He continues to work with behavioral health. As he is running out of his olanzapine, I will fill this today. He should have Dr. Cyndie Chime provide further refills.      Relevant Medications   OLANZapine (ZYPREXA) 10 MG tablet   Other Visit Diagnoses       S/P bilateral inguinal herniorrhaphy    -  Primary   Surgical sites healing well. I will provide some limited T#3 for ongoing post-op discomfort.   Relevant Medications   acetaminophen-codeine (TYLENOL #3) 300-30 MG tablet     Abdominal wall seroma, initial encounter       Exam consistent with a seroma. I  reassured him that this should resolve on its own. He shoudl keep his post-op visit with surgery.       Return for Follow-up as scheduled.   Loyola Mast, MD

## 2023-12-22 ENCOUNTER — Other Ambulatory Visit: Payer: Self-pay

## 2023-12-24 ENCOUNTER — Other Ambulatory Visit (HOSPITAL_COMMUNITY): Payer: Self-pay

## 2023-12-24 ENCOUNTER — Other Ambulatory Visit: Payer: Self-pay

## 2023-12-24 ENCOUNTER — Telehealth: Payer: Self-pay | Admitting: Pharmacy Technician

## 2023-12-24 NOTE — Telephone Encounter (Signed)
 Pharmacy Patient Advocate Encounter   Received notification from CoverMyMeds that prior authorization for OLANZAPINE 10MG  TABLETS is required/requested.   Insurance verification completed.   The patient is insured through [facility] .   Per test claim: PA required; PA submitted to above mentioned insurance via CoverMyMeds Key/confirmation #/EOC [facility] Status is pending

## 2023-12-24 NOTE — Telephone Encounter (Signed)
 Copied from CRM [id number]. Topic: Clinical - Prescription Issue >> Dec 24, 2023 12:10 PM Eunice Blase wrote: Reason for CRM: Pt's care giver stated OLANZapine (ZYPREXA) 10 MG tablet needs prior authorization. Please call pt, care giver [name] [phone]

## 2023-12-24 NOTE — Telephone Encounter (Signed)
 Pharmacy Patient Advocate Encounter  Received notification from [facility] that Prior Authorization for [facility] 10MG  has been APPROVED from 12/24/23 to 12/23/24. Ran test claim, Copay is $4.00. This test claim was processed through [facility]- copay amounts may vary at other pharmacies due to pharmacy/plan contracts, or as the patient moves through the different stages of their insurance plan.   PA #/Case ID/Reference #: [id number]  Left message on care giver [name] voice mail that PA was approved

## 2023-12-28 ENCOUNTER — Telehealth (HOSPITAL_COMMUNITY): Payer: Self-pay | Admitting: Student

## 2024-01-04 ENCOUNTER — Telehealth (HOSPITAL_COMMUNITY): Payer: Self-pay | Admitting: *Deleted

## 2024-01-04 ENCOUNTER — Telehealth (HOSPITAL_COMMUNITY): Payer: Self-pay

## 2024-01-04 NOTE — Telephone Encounter (Signed)
 Gregory Bishop called again wanting to speak to provider. She has some questions regarding his last visit with Dr. Cyndie Chime. Patient can not remember nothing about his last visit. Also there is no follow up scheduled at this time. Please advise. Thanks.

## 2024-01-04 NOTE — Telephone Encounter (Signed)
 Dow Adolph ,    Not sure who to send this too Pt's significant called me and Dr. Doyne Keel who was greatly able to assist with pts questions (Dr. Doyne Keel is not the provider of the Patient) and pts's significant other. He did give approval of release of information on phone and states that she has one on file.     Pt's mother wish to find/ or switch providers and complains that she is not a good fit with current one. Says at this point she wants to talk to managements in regards to Pt's medication and visits. States that Once there is a medication that he is put on Dr. Changes it making him worse. Says she worries that he could be harmed with switches in medication especially during night giving that he does struggle with alcohol consumption.     JNL, CMA

## 2024-01-14 ENCOUNTER — Other Ambulatory Visit: Payer: Self-pay

## 2024-01-27 ENCOUNTER — Ambulatory Visit (INDEPENDENT_AMBULATORY_CARE_PROVIDER_SITE_OTHER): Payer: MEDICAID | Admitting: Licensed Clinical Social Worker

## 2024-01-27 ENCOUNTER — Telehealth (HOSPITAL_COMMUNITY): Payer: Self-pay | Admitting: Student

## 2024-01-27 DIAGNOSIS — F1021 Alcohol dependence, in remission: Secondary | ICD-10-CM | POA: Diagnosis not present

## 2024-01-27 DIAGNOSIS — F331 Major depressive disorder, recurrent, moderate: Secondary | ICD-10-CM

## 2024-01-27 DIAGNOSIS — F4310 Post-traumatic stress disorder, unspecified: Secondary | ICD-10-CM

## 2024-01-27 NOTE — Telephone Encounter (Addendum)
 Called Gregory Bishop and Gregory Bishop at [phone]   My last encounter with Gregory Bishop was 10/21/2023, virtual, [place] alone.   Gregory Bishop's call 12/28/2023, and then reportedly numerous times after that.   I was not aware until 01/27/2024.   I apologized for not returning call, unsure why I did not receive a notification that she called.  The original reason for calling was concern of medication causing side effects, however, things are good now and they have no concerns.   The side effects from psych medications she wanted to talk about was memory issues, confusion and "mania" which when she described, it was more consistent with irritability--reported at times Gregory Bishop would have episodes of back talking, being rude, and verbal arguments. Also described him as child-like at times. This is not all the time, she notices that it cycles, worst (the irritability) when he came home from hospitalization, for asthma and PNA where he was dc'd with steroids and antibiotics, and after hernia repairs. Also after the hospital and procedures, noted that he would appear altered and confused and had lapse in memory. After my last encounter with Gregory Bishop, she stated that he continued to be irritable. She noted that he was quitting cigarettes at that time as well.   However she stated that Gregory Bishop has had memory issues and irritability for years that started when he was drinking EtOH and after his head injury and LOC (see ED note 04/03/2019). Mentioned during my initial assessment (03/30/2024). Had also been seen by neuro and dx with mild cognitive d/o (see 04/29/2023).   When asked if she thinks remeron  is the cause for this, she tells me she has not given him remeron  since 09/2023. I made the change of dc zyprexa  10 mg because he was still having issues with sleep at night, sedation during the daytime, poor appetite, depressed mood, anxious mood despite being on it. Then I started remeron  15 mg at night to address these concerns. Stated  that when she gave him remeron  for 09/2023, stated that was irritable. She stated that remeron  did nothing for that and made it worst. Also stated that it was a difficult time of year for him because of his family. So she stopped it.   Only time he had remeron  was when he was in the hospital in 10/2023.  They have actually asked PCP Dr. Therese Flash to do a prior auth to get him back on zyprexa  10 mg, 12/2023.  I went over the risk, side effects, and concerns of Gregory Bishop being on zyprexa  long-term, especially the concern for cognitive decline associated with anticholinergics in someone with cognitive impairment already.  Gregory Bishop stated that she understood this, but his irritability without it is intolerable to her.  Stated that since Gregory Bishop has been on zyprexa  again, no longer having irritable mood. Stated that at this time things are going well, and doesn't want any med changes at this time, Stated that they want to at least wait after her birthday in May to make changes.  Gregory Bishop also on the phone was in agreement of this plan.   So scheduled him for a f/u in 03/23/2024. Also made them aware that I am leaving and that there will be a new psychiatrist taking care of Gregory Bishop because this is a residency clinic and residents cycle out every ~1-2 yrs.   A/P:  Their biggest concern was Gregory Bishop's irritability and memory after confusion had self-resolve.   # Suspected delirium that is now resolved From Gregory Bishop's description, I suspect Gregory Bishop's change  in mental status and confusion was delirium after his hospitalization and procedures. Had waxing and waning mentation and state of alertness. This resolved not long after settling at home after procedure.    # H/O TBI w LOC # H/O CVA # Mild cognitive impairment # MDD vs unspecified mood d/o (R/O GAD) I suspect that when the switch from zyprexa  to remeron  in 08/2023, the irritability and memory issues were from mood changes from his multiple TBI, mild cognitive  impairment and depressed and anxious mood because of the holiday sxs. I believe that remeron  would have been beneficial for his mood, but have not had an appropriate trial. He had no remeron  or zyprexa  for Jan and Feb, zyprexa  restarted by PCP in 12/2023. This has improved his irritability. I'm ok with having him on zyprexa  for now, but am concerned for long-term use because of his MCI and believe that he would better benefit from anti-seizure med for his irritability 2/2 TBI hx. Plan to re-evaluate for depression and anxiety to see if rx would be indicated. Ideally would like to slow down cognitive decline by treating depression/anxiety avoiding meds with high anticholinergic effects.   For now, plan to have him continue his current meds.

## 2024-01-27 NOTE — Progress Notes (Addendum)
 THERAPIST PROGRESS NOTE  Virtual Visit via Video Note  I connected with Gregory Bishop on 01/27/24 at  2:00 PM EDT by a video enabled telemedicine application and verified that I am speaking with the correct person using two identifiers.  Location: Patient: Gregory Bishop  Provider: Providers Home    I discussed the limitations of evaluation and management by telemedicine and the availability of in person appointments. The patient expressed understanding and agreed to proceed.     I discussed the assessment and treatment plan with the patient. The patient was provided an opportunity to ask questions and all were answered. The patient agreed with the plan and demonstrated an understanding of the instructions.   The patient was advised to call back or seek an in-person evaluation if the symptoms worsen or if the condition fails to improve as anticipated.  I provided 30 minutes of non-face-to-face time during this encounter.   Gregory Bishop, Gregory Bishop   Participation Level: Active  Behavioral Response: CasualAlertAnxious and Depressed  Type of Therapy: Individual Therapy  Treatment Goals addressed:  Active     PTSD-Trauma Disorder CCP Problem  1 PTSD       Decrease PHQ-9 down 10  (Progressing)     Start:  08/18/22    Expected End:  05/12/24         Decrease GAD-7 down by 10  (Progressing)     Start:  08/18/22    Expected End:  05/12/24         Gain income either through SSDI or work.  (Progressing)     Start:  08/18/22    Expected End:  05/12/24         ENCOURAGE Gregory Bishop TO PRACTICE BREATHING RETRAINING FOR 10 MINUTES, 3 TIMES PER DAY (Completed)     Start:  08/18/22    End:  01/21/23      WORK WITH Gregory Bishop TO COMPLETE THE PHQ-9 DURING Gregory Bishop INDIVIDUAL SESSION (Completed)     Start:  08/18/22    End:  10/01/22      EDUCATE Gregory Bishop ON COMMON REACTIONS TO A TRAUMATIC EXPERIENCE (Completed)     Start:  08/18/22    End:  11/18/22      WORK WITH Gregory Bishop TO  CONSTRUCT AN "IN VIVO EXPOSURE HIERARCHY" A LIST OF THE SITUATIONS, PEOPLE, & PLACES THAT Gregory Bishop BECAUSE OF THEIR TRAUMA (Completed)     Start:  08/18/22    End:  11/18/22      ENCOURAGE Gregory Bishop TO PRACTICE ON IN VIVO EXPOSURE ASSIGNMENT BETWEEN INDIVIDUAL SESSIONS AND TRACK THEIR RESPONSE USING PRE-, POST-, AND PEAK- SUDS SCORE (Completed)     Start:  08/18/22    End:  11/18/22          Progress Towards Goals: Progressing  Interventions: CBT, Motivational Interviewing, and Supportive  Suicidal/Homicidal: Nowithout intent/plan  Therapist Response:    Patient was alert and oriented x 5.  He was pleasant, cooperative, maintained good eye contact.  He engaged well in therapy session and was dressed casually.  Gregory Bishop presented with euthymic mood\affect.  Patient comes in today with caregiver Gregory Bishop.  Gregory Bishop reports that she is upset about medication management team.  Gregory Bishop states that she has 2 or 3 phone calls out to providers trying to get follow-up from an appointment in January with Gregory Bishop.  Gregory Bishop states that he remembers having an appointment but does not remember the details of the appointment such as what was said in the appointments.  Gregory Bishop does note  that Gregory Bishop has cognitive memory issues from chronic alcoholism.  Gregory Bishop reports today that she would like to escalate this to her supervisor.  Gregory Bishop emailed patient's information in a secure email to Gregory Bishop and Gregory Bishop supervisors at Gregory Bishop.  Gregory Bishop and patient satisfied with Gregory Bishop's interventions for questions.  Gregory Bishop reports stressors for disability hearing May 1.  He reports stressors for the questions that they may ask of him and him may not knowing the answers.  Patient reports that it is important to get this Social Security disability approved.  Patient's endorses symptoms for tension and worry.  Intervention/plan: Gregory Bishop utilized psychoanalytic therapy for patient to  express thoughts, feelings and emotions and nonjudgmental environment.  Gregory Bishop utilized supportive therapy for praise and encouragement.  Gregory Bishop utilized motivational interviewing for open-ended questions and reflective listening.  Gregory Bishop utilized solution focused therapy to help patient resolve medication management questions by Gregory Bishop Health Care supervisors at [facility] as requested by patient and patient's caregiver Gregory Bishop.   Plan: Return again in 5 weeks.  Diagnosis: Alcohol use disorder, severe, in sustained remission (HCC)  PTSD (post-traumatic stress disorder)  MDD (major depressive disorder), recurrent episode, moderate (HCC)  Collaboration of Care: Other None today   Patient/Guardian was advised Release of Information must be obtained prior to any record release in order to collaborate their care with an outside provider. Patient/Guardian was advised if they have not already done so to contact the registration department to sign all necessary forms in order for us  to release information regarding their care.   Consent: Patient/Guardian gives verbal consent for treatment and assignment of benefits for services provided during this visit. Patient/Guardian expressed understanding and agreed to proceed.   Gregory Bishop, Gregory Bishop 01/27/2024

## 2024-02-02 ENCOUNTER — Other Ambulatory Visit: Payer: Self-pay | Admitting: Student

## 2024-02-02 ENCOUNTER — Other Ambulatory Visit (HOSPITAL_COMMUNITY): Payer: Self-pay | Admitting: Student

## 2024-02-02 DIAGNOSIS — F1021 Alcohol dependence, in remission: Secondary | ICD-10-CM

## 2024-02-02 DIAGNOSIS — F331 Major depressive disorder, recurrent, moderate: Secondary | ICD-10-CM

## 2024-02-04 ENCOUNTER — Other Ambulatory Visit: Payer: Self-pay

## 2024-02-04 MED ORDER — THIAMINE HCL 100 MG PO TABS
100.0000 mg | ORAL_TABLET | Freq: Every day | ORAL | 0 refills | Status: DC
  Filled 2024-02-04: qty 60, 60d supply, fill #0

## 2024-02-04 MED ORDER — VITAMIN B-12 1000 MCG PO TABS
1000.0000 ug | ORAL_TABLET | Freq: Every day | ORAL | 0 refills | Status: AC
  Filled 2024-02-04: qty 60, 60d supply, fill #0

## 2024-02-04 MED ORDER — OLANZAPINE 10 MG PO TABS
10.0000 mg | ORAL_TABLET | Freq: Every day | ORAL | 0 refills | Status: DC
  Filled 2024-02-04: qty 30, 30d supply, fill #0

## 2024-02-04 NOTE — Telephone Encounter (Signed)
 Next appointment 03/23/2024  Refilled patient's rx per request to bridge until next appointment: Gregory Bishop was seen today for medication refill.  MDD (major depressive disorder), recurrent episode, moderate (HCC) Overview: Initially dx of BiPD1 then changed to unspecified mood d/o because of EtOH use, then changed to MDD - sxs are more consistent with MDD when he stopped drinking etoh  Orders: -     OLANZapine ; Take 1 tablet (10 mg total) by mouth at bedtime.  Dispense: 30 tablet; Refill: 0  Pharmacy sent to: [facility] [address], [address] [place] [address] Phone: [phone] Fax: [phone]

## 2024-02-04 NOTE — Telephone Encounter (Signed)
 Refilling rx per request

## 2024-02-11 ENCOUNTER — Other Ambulatory Visit: Payer: Self-pay

## 2024-02-11 ENCOUNTER — Other Ambulatory Visit (HOSPITAL_COMMUNITY): Payer: Self-pay | Admitting: Student

## 2024-02-11 DIAGNOSIS — F1021 Alcohol dependence, in remission: Secondary | ICD-10-CM

## 2024-02-14 ENCOUNTER — Other Ambulatory Visit: Payer: Self-pay

## 2024-02-14 MED ORDER — ACAMPROSATE CALCIUM 333 MG PO TBEC
666.0000 mg | DELAYED_RELEASE_TABLET | Freq: Three times a day (TID) | ORAL | 0 refills | Status: DC
  Filled 2024-02-14: qty 360, 60d supply, fill #0

## 2024-02-14 NOTE — Telephone Encounter (Signed)
 Approved refill request for campral 

## 2024-02-15 ENCOUNTER — Other Ambulatory Visit: Payer: Self-pay

## 2024-02-22 ENCOUNTER — Other Ambulatory Visit: Payer: Self-pay

## 2024-02-25 ENCOUNTER — Other Ambulatory Visit: Payer: Self-pay

## 2024-03-17 ENCOUNTER — Ambulatory Visit: Payer: MEDICAID | Admitting: Family Medicine

## 2024-03-20 ENCOUNTER — Telehealth: Payer: Self-pay | Admitting: Family Medicine

## 2024-03-20 NOTE — Telephone Encounter (Signed)
 01/25/2023 same day cancel/transportation 08/09/2023 same day cancel/transportation 08/13/2023 same day cancel/transportation  08/17/2023 sent final warning letter  03/17/2024 same day cancel/no reason given  Pt has rescheduled for 03/27/2024. Would you like to dismiss with 03/27/2024 as last visit with you?

## 2024-03-23 ENCOUNTER — Telehealth (HOSPITAL_COMMUNITY): Payer: MEDICAID | Admitting: Student

## 2024-03-23 ENCOUNTER — Other Ambulatory Visit: Payer: Self-pay

## 2024-03-23 DIAGNOSIS — F331 Major depressive disorder, recurrent, moderate: Secondary | ICD-10-CM

## 2024-03-23 DIAGNOSIS — Z91148 Patient's other noncompliance with medication regimen for other reason: Secondary | ICD-10-CM

## 2024-03-23 DIAGNOSIS — Z72821 Inadequate sleep hygiene: Secondary | ICD-10-CM

## 2024-03-23 DIAGNOSIS — F17201 Nicotine dependence, unspecified, in remission: Secondary | ICD-10-CM

## 2024-03-23 DIAGNOSIS — F1021 Alcohol dependence, in remission: Secondary | ICD-10-CM

## 2024-03-23 DIAGNOSIS — Z79899 Other long term (current) drug therapy: Secondary | ICD-10-CM

## 2024-03-23 DIAGNOSIS — F4310 Post-traumatic stress disorder, unspecified: Secondary | ICD-10-CM

## 2024-03-23 MED ORDER — ACAMPROSATE CALCIUM 333 MG PO TBEC
666.0000 mg | DELAYED_RELEASE_TABLET | Freq: Three times a day (TID) | ORAL | 0 refills | Status: AC
  Filled 2024-03-23 – 2024-04-21 (×3): qty 360, 60d supply, fill #0

## 2024-03-23 MED ORDER — SERTRALINE HCL 50 MG PO TABS
50.0000 mg | ORAL_TABLET | Freq: Every morning | ORAL | 0 refills | Status: DC
  Filled 2024-03-23: qty 30, 30d supply, fill #0

## 2024-03-23 MED ORDER — SERTRALINE HCL 25 MG PO TABS: 25.0000 mg | ORAL_TABLET | Freq: Every morning | ORAL | 0 refills | Status: AC

## 2024-03-23 MED ORDER — NALTREXONE HCL 50 MG PO TABS
50.0000 mg | ORAL_TABLET | Freq: Every day | ORAL | 1 refills | Status: AC
  Filled 2024-03-23: qty 30, 30d supply, fill #0
  Filled 2024-04-19: qty 30, 30d supply, fill #1

## 2024-03-23 MED ORDER — SERTRALINE HCL 25 MG PO TABS
25.0000 mg | ORAL_TABLET | Freq: Every morning | ORAL | 0 refills | Status: DC
  Filled 2024-03-23: qty 30, 30d supply, fill #0

## 2024-03-23 MED ORDER — OLANZAPINE 10 MG PO TABS
10.0000 mg | ORAL_TABLET | Freq: Every day | ORAL | 0 refills | Status: DC
  Filled 2024-03-23: qty 30, 30d supply, fill #0
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
## 2024-03-24 ENCOUNTER — Other Ambulatory Visit: Payer: Self-pay

## 2024-03-24 ENCOUNTER — Encounter (HOSPITAL_COMMUNITY): Payer: Self-pay | Admitting: Student

## 2024-03-27 ENCOUNTER — Ambulatory Visit: Payer: MEDICAID | Admitting: Family Medicine

## 2024-03-28 ENCOUNTER — Telehealth: Payer: Self-pay

## 2024-03-28 ENCOUNTER — Encounter: Payer: Self-pay | Admitting: Family Medicine

## 2024-03-28 ENCOUNTER — Telehealth: Payer: Self-pay | Admitting: Family Medicine

## 2024-03-28 NOTE — Telephone Encounter (Signed)
 01/25/2023 same day cancel/transportation 08/09/2023 same day cancel/transportation 08/13/2023 same day cancel/transportation   08/17/2023 sent final warning letter   03/17/2024 same day cancel/no reason given  You were going to discuss with pt on 03/27/2024 but pt was no show.   Pt called in this morning and has rescheduled for 04/19/2024. I have generated dismissal letter with 04/19/2024 being pts last appt with our office.

## 2024-03-28 NOTE — Telephone Encounter (Signed)
 Copied from CRM [id number]. Topic: Appointments - Appointment Scheduling >> Mar 28, 2024  7:54 AM Antonieta Kitten wrote: Gregory Bishop is calling to schedule an appointment. Also would like to speak with someone about a billing issue that has been on going and not resolved. Contact number would be [phone]  Please review. Thanks. Dm/cma

## 2024-03-31 NOTE — Telephone Encounter (Signed)
 Msg sent to billing team to look at 02/01/2023 inv

## 2024-03-31 NOTE — Telephone Encounter (Signed)
 LVM for pt regarding billing issue. I believe this is for 01/2023.  Letter was sent to the pt 11/30/2023, Medicaid denied as they wanted other to make sure he had no other insurance, pt never contacted Medicaid per billing notes. [employer] was showing as active during that time. Pt needs to contact Medicaid. Left billing dept number if pt has additional questions. Billing [phone].

## 2024-04-19 ENCOUNTER — Ambulatory Visit: Admitting: Family Medicine

## 2024-04-19 ENCOUNTER — Other Ambulatory Visit: Payer: Self-pay | Admitting: Family Medicine

## 2024-04-19 ENCOUNTER — Other Ambulatory Visit: Payer: Self-pay

## 2024-04-19 ENCOUNTER — Other Ambulatory Visit (HOSPITAL_COMMUNITY): Payer: Self-pay

## 2024-04-19 DIAGNOSIS — F1021 Alcohol dependence, in remission: Secondary | ICD-10-CM | POA: Diagnosis not present

## 2024-04-19 DIAGNOSIS — F3176 Bipolar disorder, in full remission, most recent episode depressed: Secondary | ICD-10-CM | POA: Diagnosis not present

## 2024-04-19 DIAGNOSIS — Z1211 Encounter for screening for malignant neoplasm of colon: Secondary | ICD-10-CM | POA: Diagnosis not present

## 2024-04-19 DIAGNOSIS — E559 Vitamin D deficiency, unspecified: Secondary | ICD-10-CM

## 2024-04-19 DIAGNOSIS — R413 Other amnesia: Secondary | ICD-10-CM | POA: Diagnosis not present

## 2024-04-19 DIAGNOSIS — F411 Generalized anxiety disorder: Secondary | ICD-10-CM | POA: Diagnosis not present

## 2024-04-19 DIAGNOSIS — Z7689 Persons encountering health services in other specified circumstances: Secondary | ICD-10-CM | POA: Diagnosis not present

## 2024-04-19 DIAGNOSIS — N529 Male erectile dysfunction, unspecified: Secondary | ICD-10-CM | POA: Diagnosis not present

## 2024-04-19 DIAGNOSIS — K4090 Unilateral inguinal hernia, without obstruction or gangrene, not specified as recurrent: Secondary | ICD-10-CM | POA: Diagnosis not present

## 2024-04-19 DIAGNOSIS — Z0000 Encounter for general adult medical examination without abnormal findings: Secondary | ICD-10-CM | POA: Diagnosis not present

## 2024-04-19 DIAGNOSIS — J4530 Mild persistent asthma, uncomplicated: Secondary | ICD-10-CM | POA: Diagnosis not present

## 2024-04-19 DIAGNOSIS — F17201 Nicotine dependence, unspecified, in remission: Secondary | ICD-10-CM | POA: Diagnosis not present

## 2024-04-19 DIAGNOSIS — F4310 Post-traumatic stress disorder, unspecified: Secondary | ICD-10-CM | POA: Diagnosis not present

## 2024-04-20 ENCOUNTER — Other Ambulatory Visit: Payer: Self-pay

## 2024-04-21 ENCOUNTER — Other Ambulatory Visit: Payer: Self-pay

## 2024-04-24 ENCOUNTER — Other Ambulatory Visit: Payer: Self-pay

## 2024-04-25 ENCOUNTER — Other Ambulatory Visit: Payer: Self-pay

## 2024-04-25 ENCOUNTER — Other Ambulatory Visit (HOSPITAL_COMMUNITY): Payer: Self-pay

## 2024-04-25 MED ORDER — OLANZAPINE 10 MG PO TABS
10.0000 mg | ORAL_TABLET | Freq: Every day | ORAL | 3 refills | Status: AC
  Filled 2024-04-25: qty 90, 90d supply, fill #0
  Filled 2024-06-30 – 2024-07-31 (×2): qty 90, 90d supply, fill #1

## 2024-04-25 MED ORDER — THIAMINE HCL 100 MG PO TABS
100.0000 mg | ORAL_TABLET | Freq: Every day | ORAL | 3 refills | Status: AC
  Filled 2024-04-25: qty 100, 100d supply, fill #0
  Filled 2024-06-30: qty 100, 100d supply, fill #1

## 2024-04-25 MED ORDER — VITAMIN D (ERGOCALCIFEROL) 1.25 MG (50000 UNIT) PO CAPS
50000.0000 [IU] | ORAL_CAPSULE | ORAL | 3 refills | Status: AC
  Filled 2024-04-25: qty 12, 84d supply, fill #0
  Filled 2024-06-30: qty 12, 84d supply, fill #1

## 2024-04-26 ENCOUNTER — Other Ambulatory Visit: Payer: Self-pay

## 2024-04-26 ENCOUNTER — Other Ambulatory Visit (HOSPITAL_COMMUNITY): Payer: Self-pay

## 2024-04-27 ENCOUNTER — Other Ambulatory Visit: Payer: Self-pay

## 2024-04-27 MED ORDER — VITAMIN B-12 1000 MCG PO TABS
1000.0000 ug | ORAL_TABLET | Freq: Every day | ORAL | 0 refills | Status: DC
  Filled 2024-04-27: qty 90, 90d supply, fill #0

## 2024-05-03 ENCOUNTER — Other Ambulatory Visit: Payer: Self-pay

## 2024-06-20 ENCOUNTER — Other Ambulatory Visit: Payer: Self-pay

## 2024-06-20 MED ORDER — NALTREXONE HCL 50 MG PO TABS
50.0000 mg | ORAL_TABLET | Freq: Every day | ORAL | 0 refills | Status: DC
  Filled 2024-06-20 – 2024-07-03 (×2): qty 30, 30d supply, fill #0

## 2024-06-20 MED ORDER — SERTRALINE HCL 50 MG PO TABS
50.0000 mg | ORAL_TABLET | Freq: Every day | ORAL | 3 refills | Status: AC
  Filled 2024-06-20 – 2024-07-03 (×2): qty 90, 90d supply, fill #0

## 2024-06-29 ENCOUNTER — Other Ambulatory Visit: Payer: Self-pay

## 2024-06-30 ENCOUNTER — Other Ambulatory Visit: Payer: Self-pay

## 2024-06-30 ENCOUNTER — Other Ambulatory Visit: Payer: Self-pay | Admitting: Family Medicine

## 2024-06-30 ENCOUNTER — Other Ambulatory Visit (HOSPITAL_COMMUNITY): Payer: Self-pay

## 2024-06-30 DIAGNOSIS — E559 Vitamin D deficiency, unspecified: Secondary | ICD-10-CM

## 2024-06-30 MED ORDER — B-12 1000 MCG PO TABS
1.0000 | ORAL_TABLET | Freq: Every day | ORAL | 0 refills | Status: DC
  Filled 2024-06-30: qty 90, 90d supply, fill #0

## 2024-06-30 MED ORDER — VITAMIN D (ERGOCALCIFEROL) 1.25 MG (50000 UNIT) PO CAPS
50000.0000 [IU] | ORAL_CAPSULE | ORAL | 0 refills | Status: AC
  Filled 2024-06-30: qty 12, 84d supply, fill #0

## 2024-07-03 ENCOUNTER — Other Ambulatory Visit: Payer: Self-pay

## 2024-07-31 ENCOUNTER — Other Ambulatory Visit: Payer: Self-pay

## 2024-07-31 ENCOUNTER — Other Ambulatory Visit: Payer: Self-pay | Admitting: Family Medicine

## 2024-07-31 DIAGNOSIS — J4530 Mild persistent asthma, uncomplicated: Secondary | ICD-10-CM

## 2024-08-01 ENCOUNTER — Other Ambulatory Visit: Payer: Self-pay

## 2024-08-01 MED ORDER — NALTREXONE HCL 50 MG PO TABS
50.0000 mg | ORAL_TABLET | Freq: Every day | ORAL | 0 refills | Status: DC
  Filled 2024-08-01: qty 30, 30d supply, fill #0

## 2024-08-01 MED ORDER — ACAMPROSATE CALCIUM 333 MG PO TBEC
666.0000 mg | DELAYED_RELEASE_TABLET | Freq: Two times a day (BID) | ORAL | 0 refills | Status: DC
  Filled 2024-08-01: qty 360, 90d supply, fill #0

## 2024-08-01 MED ORDER — OLANZAPINE 10 MG PO TABS
10.0000 mg | ORAL_TABLET | Freq: Every day | ORAL | 3 refills | Status: AC
  Filled 2024-08-01 – 2024-10-27 (×2): qty 90, 90d supply, fill #0

## 2024-08-02 ENCOUNTER — Other Ambulatory Visit: Payer: Self-pay

## 2024-08-03 ENCOUNTER — Other Ambulatory Visit: Payer: Self-pay

## 2024-08-16 ENCOUNTER — Other Ambulatory Visit: Payer: Self-pay

## 2024-08-16 MED ORDER — THIAMINE HCL 100 MG PO TABS
100.0000 mg | ORAL_TABLET | ORAL | 3 refills | Status: AC
  Filled 2024-08-16: qty 100, 100d supply, fill #0
  Filled 2024-10-27 – 2024-10-30 (×4): qty 90, 90d supply, fill #0

## 2024-08-16 MED ORDER — OLANZAPINE 10 MG PO TABS: 10.0000 mg | ORAL_TABLET | Freq: Every day | ORAL | 3 refills | Status: AC

## 2024-08-25 ENCOUNTER — Other Ambulatory Visit: Payer: Self-pay

## 2024-09-20 ENCOUNTER — Other Ambulatory Visit: Payer: Self-pay

## 2024-09-20 MED ORDER — OLANZAPINE 10 MG PO TBDP
10.0000 mg | ORAL_TABLET | Freq: Every day | ORAL | 3 refills | Status: AC
  Filled 2024-09-20: qty 90, 90d supply, fill #0

## 2024-09-20 MED ORDER — MONTELUKAST SODIUM 10 MG PO TABS
10.0000 mg | ORAL_TABLET | Freq: Every day | ORAL | 3 refills | Status: AC
  Filled 2024-09-20: qty 90, 90d supply, fill #0
  Filled 2024-10-27: qty 90, 90d supply, fill #1

## 2024-09-20 MED ORDER — VITAMIN D (ERGOCALCIFEROL) 50000 UNITS PO CAPS
50000.0000 [IU] | ORAL_CAPSULE | ORAL | 3 refills | Status: AC
  Filled 2024-09-20: qty 12, 84d supply, fill #0

## 2024-09-21 ENCOUNTER — Other Ambulatory Visit: Payer: Self-pay

## 2024-09-22 ENCOUNTER — Other Ambulatory Visit: Payer: Self-pay

## 2024-09-25 ENCOUNTER — Other Ambulatory Visit: Payer: Self-pay

## 2024-10-01 ENCOUNTER — Other Ambulatory Visit: Payer: Self-pay

## 2024-10-02 ENCOUNTER — Other Ambulatory Visit: Payer: Self-pay

## 2024-10-19 ENCOUNTER — Other Ambulatory Visit: Payer: Self-pay
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
## 2024-10-27 ENCOUNTER — Other Ambulatory Visit (HOSPITAL_COMMUNITY): Payer: Self-pay

## 2024-10-27 ENCOUNTER — Other Ambulatory Visit: Payer: Self-pay | Admitting: Family Medicine

## 2024-10-27 DIAGNOSIS — J4530 Mild persistent asthma, uncomplicated: Secondary | ICD-10-CM

## 2024-10-28 ENCOUNTER — Other Ambulatory Visit (HOSPITAL_COMMUNITY): Payer: Self-pay

## 2024-10-28 MED ORDER — VITAMIN B-12 1000 MCG PO TABS
1000.0000 ug | ORAL_TABLET | Freq: Every day | ORAL | 3 refills | Status: AC
  Filled 2024-10-28 – 2024-10-30 (×4): qty 90, 90d supply, fill #0

## 2024-10-28 MED ORDER — ACAMPROSATE CALCIUM 333 MG PO TBEC
666.0000 mg | DELAYED_RELEASE_TABLET | Freq: Two times a day (BID) | ORAL | 0 refills | Status: AC
  Filled 2024-10-28 – 2024-10-30 (×4): qty 360, 90d supply, fill #0

## 2024-10-29 ENCOUNTER — Other Ambulatory Visit (HOSPITAL_COMMUNITY): Payer: Self-pay

## 2024-10-30 ENCOUNTER — Other Ambulatory Visit (HOSPITAL_COMMUNITY): Payer: Self-pay

## 2024-10-30 ENCOUNTER — Other Ambulatory Visit: Payer: Self-pay

## 2024-11-01 ENCOUNTER — Other Ambulatory Visit: Payer: Self-pay

## 2024-11-01 MED ORDER — FLUTICASONE-SALMETEROL 100-50 MCG/ACT IN AEPB
1.0000 | INHALATION_SPRAY | Freq: Two times a day (BID) | RESPIRATORY_TRACT | 3 refills | Status: AC
  Filled 2024-11-01: qty 60, 30d supply, fill #0

## 2024-11-02 ENCOUNTER — Other Ambulatory Visit: Payer: Self-pay

## 2024-11-02 ENCOUNTER — Ambulatory Visit (HOSPITAL_COMMUNITY): Payer: Medicare (Managed Care)

## 2024-11-02 VITALS — BP 110/78 | HR 85 | Ht 71.0 in | Wt 163.0 lb

## 2024-11-02 DIAGNOSIS — F4310 Post-traumatic stress disorder, unspecified: Secondary | ICD-10-CM | POA: Diagnosis not present

## 2024-11-02 DIAGNOSIS — F1021 Alcohol dependence, in remission: Secondary | ICD-10-CM

## 2024-11-02 DIAGNOSIS — F331 Major depressive disorder, recurrent, moderate: Secondary | ICD-10-CM | POA: Diagnosis not present

## 2024-11-02 MED ORDER — NALTREXONE HCL 50 MG PO TABS
50.0000 mg | ORAL_TABLET | Freq: Every day | ORAL | 2 refills | Status: AC
  Filled 2024-11-02: qty 30, 30d supply, fill #0

## 2024-11-03 ENCOUNTER — Other Ambulatory Visit: Payer: Self-pay

## 2024-12-06 ENCOUNTER — Ambulatory Visit (HOSPITAL_COMMUNITY): Payer: Medicare (Managed Care)

## 2025-01-04 ENCOUNTER — Telehealth (HOSPITAL_COMMUNITY): Payer: Medicare (Managed Care)
